# Patient Record
Sex: Male | Born: 1960
Health system: Southern US, Community
[De-identification: ages and names within clinical notes are randomized; demographics above are authoritative.]

## PROBLEM LIST (undated history)

## (undated) DIAGNOSIS — F319 Bipolar disorder, unspecified: Secondary | ICD-10-CM

## (undated) DIAGNOSIS — A419 Sepsis, unspecified organism: Secondary | ICD-10-CM

## (undated) DIAGNOSIS — I10 Essential (primary) hypertension: Secondary | ICD-10-CM

## (undated) DIAGNOSIS — F200 Paranoid schizophrenia: Secondary | ICD-10-CM

## (undated) DIAGNOSIS — R413 Other amnesia: Secondary | ICD-10-CM

## (undated) HISTORY — DX: Other amnesia: R41.3

## (undated) MED FILL — Iron Sucrose Inj 20 MG/ML (Fe Equiv): INTRAVENOUS | Qty: 10 | Status: AC

---

## 2006-04-11 ENCOUNTER — Emergency Department: Payer: Self-pay | Admitting: Emergency Medicine

## 2006-04-26 ENCOUNTER — Emergency Department: Payer: Self-pay

## 2006-06-13 ENCOUNTER — Emergency Department: Payer: Self-pay | Admitting: Emergency Medicine

## 2006-06-14 ENCOUNTER — Other Ambulatory Visit: Payer: Self-pay

## 2006-06-14 ENCOUNTER — Inpatient Hospital Stay: Payer: Self-pay | Admitting: Unknown Physician Specialty

## 2006-11-24 ENCOUNTER — Inpatient Hospital Stay: Payer: Self-pay | Admitting: Psychiatry

## 2006-12-20 ENCOUNTER — Emergency Department: Payer: Self-pay | Admitting: Unknown Physician Specialty

## 2007-02-02 ENCOUNTER — Inpatient Hospital Stay: Payer: Self-pay | Admitting: Psychiatry

## 2007-02-11 ENCOUNTER — Emergency Department: Payer: Self-pay | Admitting: Emergency Medicine

## 2007-07-28 ENCOUNTER — Emergency Department: Payer: Self-pay | Admitting: Emergency Medicine

## 2007-07-29 ENCOUNTER — Emergency Department: Payer: Self-pay | Admitting: Emergency Medicine

## 2007-12-05 ENCOUNTER — Emergency Department: Payer: Self-pay | Admitting: Emergency Medicine

## 2008-03-02 ENCOUNTER — Emergency Department: Payer: Self-pay | Admitting: Emergency Medicine

## 2008-07-02 ENCOUNTER — Emergency Department: Payer: Self-pay | Admitting: Emergency Medicine

## 2008-08-22 ENCOUNTER — Inpatient Hospital Stay: Payer: Self-pay | Admitting: Psychiatry

## 2008-09-21 ENCOUNTER — Inpatient Hospital Stay: Payer: Self-pay | Admitting: Psychiatry

## 2009-06-14 ENCOUNTER — Emergency Department: Payer: Self-pay | Admitting: Emergency Medicine

## 2009-06-20 ENCOUNTER — Inpatient Hospital Stay: Payer: Self-pay | Admitting: Psychiatry

## 2009-07-05 ENCOUNTER — Inpatient Hospital Stay: Payer: Self-pay | Admitting: Psychiatry

## 2009-10-14 ENCOUNTER — Emergency Department: Payer: Self-pay | Admitting: Emergency Medicine

## 2010-02-05 ENCOUNTER — Emergency Department: Payer: Self-pay | Admitting: Emergency Medicine

## 2010-07-31 ENCOUNTER — Inpatient Hospital Stay: Payer: Self-pay | Admitting: Psychiatry

## 2011-06-03 ENCOUNTER — Inpatient Hospital Stay: Payer: Self-pay | Admitting: Psychiatry

## 2012-02-14 ENCOUNTER — Emergency Department: Payer: Self-pay | Admitting: Emergency Medicine

## 2012-02-14 LAB — CBC
HCT: 48.7 % (ref 40.0–52.0)
HGB: 17.2 g/dL (ref 13.0–18.0)
MCH: 31.9 pg (ref 26.0–34.0)
MCHC: 35.4 g/dL (ref 32.0–36.0)
RDW: 13.5 % (ref 11.5–14.5)
WBC: 9.5 10*3/uL (ref 3.8–10.6)

## 2012-02-14 LAB — URINALYSIS, COMPLETE
Bacteria: NONE SEEN
Bilirubin,UR: NEGATIVE
Hyaline Cast: 1
Leukocyte Esterase: NEGATIVE
Nitrite: NEGATIVE
Protein: NEGATIVE
WBC UR: 1 /HPF (ref 0–5)

## 2012-02-14 LAB — DRUG SCREEN, URINE
Benzodiazepine, Ur Scrn: NEGATIVE (ref ?–200)
Cannabinoid 50 Ng, Ur ~~LOC~~: POSITIVE (ref ?–50)
Cocaine Metabolite,Ur ~~LOC~~: NEGATIVE (ref ?–300)
MDMA (Ecstasy)Ur Screen: NEGATIVE (ref ?–500)
Methadone, Ur Screen: NEGATIVE (ref ?–300)
Phencyclidine (PCP) Ur S: NEGATIVE (ref ?–25)
Tricyclic, Ur Screen: NEGATIVE (ref ?–1000)

## 2012-02-14 LAB — COMPREHENSIVE METABOLIC PANEL
Albumin: 4.2 g/dL (ref 3.4–5.0)
Alkaline Phosphatase: 72 U/L (ref 50–136)
Calcium, Total: 9.2 mg/dL (ref 8.5–10.1)
Chloride: 109 mmol/L — ABNORMAL HIGH (ref 98–107)
Co2: 26 mmol/L (ref 21–32)
Creatinine: 1.08 mg/dL (ref 0.60–1.30)
EGFR (African American): >60
EGFR (Non-African Amer.): >60
SGPT (ALT): 22 U/L (ref 12–78)

## 2012-02-14 LAB — ETHANOL
Ethanol %: <0.003 % (ref 0.000–0.080)
Ethanol: <3 mg/dL

## 2012-02-15 LAB — URINE CULTURE

## 2012-02-20 ENCOUNTER — Inpatient Hospital Stay: Payer: Self-pay | Admitting: Psychiatry

## 2012-02-20 LAB — COMPREHENSIVE METABOLIC PANEL
Albumin: 3.6 g/dL (ref 3.4–5.0)
Alkaline Phosphatase: 81 U/L (ref 50–136)
BUN: 11 mg/dL (ref 7–18)
Bilirubin,Total: 0.3 mg/dL (ref 0.2–1.0)
Chloride: 108 mmol/L — ABNORMAL HIGH (ref 98–107)
EGFR (African American): >60
Glucose: 91 mg/dL (ref 65–99)
Potassium: 3.8 mmol/L (ref 3.5–5.1)
SGPT (ALT): 21 U/L (ref 12–78)
Sodium: 141 mmol/L (ref 136–145)
Total Protein: 6.8 g/dL (ref 6.4–8.2)

## 2012-02-20 LAB — URINALYSIS, COMPLETE
Bacteria: NONE SEEN
Bilirubin,UR: NEGATIVE
Blood: NEGATIVE
Glucose,UR: NEGATIVE mg/dL (ref 0–75)
Nitrite: NEGATIVE
Protein: NEGATIVE
RBC,UR: 1 /HPF (ref 0–5)
Specific Gravity: 1.012 (ref 1.003–1.030)
Squamous Epithelial: NONE SEEN
WBC UR: 1 /HPF (ref 0–5)

## 2012-02-20 LAB — DRUG SCREEN, URINE
Benzodiazepine, Ur Scrn: NEGATIVE (ref ?–200)
Cannabinoid 50 Ng, Ur ~~LOC~~: NEGATIVE (ref ?–50)
Cocaine Metabolite,Ur ~~LOC~~: NEGATIVE (ref ?–300)
MDMA (Ecstasy)Ur Screen: NEGATIVE (ref ?–500)
Opiate, Ur Screen: NEGATIVE (ref ?–300)

## 2012-02-20 LAB — ETHANOL
Ethanol %: <0.003 % (ref 0.000–0.080)
Ethanol: <3 mg/dL

## 2012-02-20 LAB — TSH: Thyroid Stimulating Horm: 1.11 u[IU]/mL

## 2012-02-23 LAB — LIPID PANEL
Cholesterol: 145 mg/dL (ref 0–200)
Ldl Cholesterol, Calc: 74 mg/dL (ref 0–100)
VLDL Cholesterol, Calc: 35 mg/dL (ref 5–40)

## 2012-02-23 LAB — FOLATE: Folic Acid: 16.7 ng/mL (ref 3.1–100.0)

## 2013-03-13 ENCOUNTER — Ambulatory Visit: Payer: Self-pay | Admitting: General Surgery

## 2013-04-06 ENCOUNTER — Encounter: Payer: Self-pay | Admitting: *Deleted

## 2013-05-09 ENCOUNTER — Emergency Department: Payer: Self-pay | Admitting: Emergency Medicine

## 2013-05-09 LAB — DRUG SCREEN, URINE
Benzodiazepine, Ur Scrn: NEGATIVE (ref ?–200)
Cannabinoid 50 Ng, Ur ~~LOC~~: NEGATIVE (ref ?–50)
Cocaine Metabolite,Ur ~~LOC~~: NEGATIVE (ref ?–300)
MDMA (Ecstasy)Ur Screen: NEGATIVE (ref ?–500)
Methadone, Ur Screen: NEGATIVE (ref ?–300)
Tricyclic, Ur Screen: NEGATIVE (ref ?–1000)

## 2013-05-09 LAB — CBC
HCT: 46.7 % (ref 40.0–52.0)
HGB: 15.9 g/dL (ref 13.0–18.0)
MCH: 30.3 pg (ref 26.0–34.0)
MCHC: 34 g/dL (ref 32.0–36.0)
MCV: 89 fL (ref 80–100)
Platelet: 223 10*3/uL (ref 150–440)
RBC: 5.23 10*6/uL (ref 4.40–5.90)
WBC: 10.3 10*3/uL (ref 3.8–10.6)

## 2013-05-09 LAB — URINALYSIS, COMPLETE
Bilirubin,UR: NEGATIVE
Blood: NEGATIVE
Protein: NEGATIVE
RBC,UR: 6 /HPF (ref 0–5)
Specific Gravity: 1.018 (ref 1.003–1.030)

## 2013-05-09 LAB — COMPREHENSIVE METABOLIC PANEL
BUN: 10 mg/dL (ref 7–18)
Bilirubin,Total: 0.3 mg/dL (ref 0.2–1.0)
Chloride: 109 mmol/L — ABNORMAL HIGH (ref 98–107)
Creatinine: 0.94 mg/dL (ref 0.60–1.30)
EGFR (African American): >60
Potassium: 3.4 mmol/L — ABNORMAL LOW (ref 3.5–5.1)
SGPT (ALT): 24 U/L (ref 12–78)
Sodium: 143 mmol/L (ref 136–145)
Total Protein: 7.1 g/dL (ref 6.4–8.2)

## 2013-05-09 LAB — ACETAMINOPHEN LEVEL: Acetaminophen: <2 ug/mL

## 2013-05-09 LAB — ETHANOL
Ethanol %: <0.003 % (ref 0.000–0.080)
Ethanol: <3 mg/dL

## 2013-05-09 LAB — SALICYLATE LEVEL: Salicylates, Serum: 4.8 mg/dL — ABNORMAL HIGH

## 2013-05-13 LAB — BASIC METABOLIC PANEL
Anion Gap: 3 — ABNORMAL LOW (ref 7–16)
BUN: 7 mg/dL (ref 7–18)
Calcium, Total: 9 mg/dL (ref 8.5–10.1)
Chloride: 104 mmol/L (ref 98–107)
Co2: 30 mmol/L (ref 21–32)
Creatinine: 0.88 mg/dL (ref 0.60–1.30)
EGFR (Non-African Amer.): >60

## 2013-05-13 LAB — DRUG SCREEN, URINE
Barbiturates, Ur Screen: NEGATIVE (ref ?–200)
Cannabinoid 50 Ng, Ur ~~LOC~~: NEGATIVE (ref ?–50)
Cocaine Metabolite,Ur ~~LOC~~: NEGATIVE (ref ?–300)
MDMA (Ecstasy)Ur Screen: NEGATIVE (ref ?–500)
Opiate, Ur Screen: NEGATIVE (ref ?–300)
Phencyclidine (PCP) Ur S: NEGATIVE (ref ?–25)
Tricyclic, Ur Screen: NEGATIVE (ref ?–1000)

## 2013-05-13 LAB — URINALYSIS, COMPLETE
Bilirubin,UR: NEGATIVE
Glucose,UR: NEGATIVE mg/dL (ref 0–75)
Ketone: NEGATIVE
Leukocyte Esterase: NEGATIVE
Nitrite: NEGATIVE
Ph: 6 (ref 4.5–8.0)
Protein: NEGATIVE
RBC,UR: 4 /HPF (ref 0–5)
Specific Gravity: 1.017 (ref 1.003–1.030)
Squamous Epithelial: NONE SEEN
WBC UR: 1 /HPF (ref 0–5)

## 2013-05-13 LAB — CBC
HCT: 45.9 % (ref 40.0–52.0)
HGB: 15.7 g/dL (ref 13.0–18.0)
MCH: 30.5 pg (ref 26.0–34.0)
MCV: 89 fL (ref 80–100)
Platelet: 203 10*3/uL (ref 150–440)
RBC: 5.15 10*6/uL (ref 4.40–5.90)
RDW: 13.4 % (ref 11.5–14.5)

## 2013-05-13 LAB — ETHANOL: Ethanol %: <0.003 % (ref 0.000–0.080)

## 2013-05-17 ENCOUNTER — Inpatient Hospital Stay: Payer: Self-pay | Admitting: Psychiatry

## 2013-05-18 LAB — VALPROIC ACID LEVEL: Valproic Acid: 55 ug/mL

## 2013-06-02 ENCOUNTER — Encounter (HOSPITAL_COMMUNITY): Payer: Self-pay | Admitting: Emergency Medicine

## 2013-06-02 ENCOUNTER — Emergency Department (HOSPITAL_COMMUNITY)
Admission: EM | Admit: 2013-06-02 | Discharge: 2013-06-05 | Disposition: A | Discharge status: Home / Self Care | Payer: Medicare Other | Attending: Emergency Medicine | Admitting: Emergency Medicine

## 2013-06-02 DIAGNOSIS — F200 Paranoid schizophrenia: Secondary | ICD-10-CM

## 2013-06-02 DIAGNOSIS — I10 Essential (primary) hypertension: Secondary | ICD-10-CM | POA: Insufficient documentation

## 2013-06-02 DIAGNOSIS — F319 Bipolar disorder, unspecified: Secondary | ICD-10-CM | POA: Insufficient documentation

## 2013-06-02 DIAGNOSIS — Z79899 Other long term (current) drug therapy: Secondary | ICD-10-CM | POA: Insufficient documentation

## 2013-06-02 HISTORY — DX: Bipolar disorder, unspecified: F31.9

## 2013-06-02 HISTORY — DX: Paranoid schizophrenia: F20.0

## 2013-06-02 HISTORY — DX: Essential (primary) hypertension: I10

## 2013-06-02 LAB — CBC
HCT: 47.6 % (ref 39.0–52.0)
Hemoglobin: 16.3 g/dL (ref 13.0–17.0)
MCH: 30.1 pg (ref 26.0–34.0)
MCV: 88 fL (ref 78.0–100.0)
RDW: 12.6 % (ref 11.5–15.5)
WBC: 10.2 10*3/uL (ref 4.0–10.5)

## 2013-06-02 LAB — COMPREHENSIVE METABOLIC PANEL
AST: 23 U/L (ref 0–37)
Albumin: 4.2 g/dL (ref 3.5–5.2)
Alkaline Phosphatase: 62 U/L (ref 39–117)
BUN: 7 mg/dL (ref 6–23)
Chloride: 100 mEq/L (ref 96–112)
Creatinine, Ser: 0.92 mg/dL (ref 0.50–1.35)
GFR calc Af Amer: >90 mL/min (ref >90)
GFR calc non Af Amer: >90 mL/min (ref >90)
Glucose, Bld: 118 mg/dL — ABNORMAL HIGH (ref 70–99)
Potassium: 3.5 mEq/L (ref 3.5–5.1)
Total Bilirubin: 0.3 mg/dL (ref 0.3–1.2)

## 2013-06-02 LAB — ETHANOL: Alcohol, Ethyl (B): <11 mg/dL (ref 0–11)

## 2013-06-02 LAB — ACETAMINOPHEN LEVEL: Acetaminophen (Tylenol), Serum: <15 ug/mL (ref 10–30)

## 2013-06-02 LAB — SALICYLATE LEVEL: Salicylate Lvl: <2 mg/dL — ABNORMAL LOW (ref 2.8–20.0)

## 2013-06-02 LAB — RAPID URINE DRUG SCREEN, HOSP PERFORMED
Barbiturates: NOT DETECTED
Benzodiazepines: NOT DETECTED

## 2013-06-02 NOTE — ED Notes (Signed)
Per ems : The patient had c/o Ha and the patient has HTN - the group home reports that he not compliant with medications. The group home would like him evaluated. The patient is confused about what he would like.

## 2013-06-03 DIAGNOSIS — F29 Unspecified psychosis not due to a substance or known physiological condition: Secondary | ICD-10-CM

## 2013-06-03 DIAGNOSIS — F3160 Bipolar disorder, current episode mixed, unspecified: Secondary | ICD-10-CM

## 2013-06-03 MED ORDER — METOPROLOL TARTRATE 25 MG PO TABS
25.0000 mg | ORAL_TABLET | Freq: Two times a day (BID) | ORAL | Status: DC
  Administered 2013-06-04 – 2013-06-05 (×3): 25 mg via ORAL
  Filled 2013-06-03 (×4): qty 1

## 2013-06-03 MED ORDER — RISPERIDONE 2 MG PO TABS
2.0000 mg | ORAL_TABLET | Freq: Two times a day (BID) | ORAL | Status: DC
  Administered 2013-06-04 – 2013-06-05 (×3): 2 mg via ORAL
  Filled 2013-06-03 (×4): qty 1

## 2013-06-03 MED ORDER — CLONAZEPAM 0.5 MG PO TABS
0.5000 mg | ORAL_TABLET | Freq: Two times a day (BID) | ORAL | Status: DC
  Administered 2013-06-04 – 2013-06-05 (×3): 0.5 mg via ORAL
  Filled 2013-06-03 (×4): qty 1

## 2013-06-03 MED ORDER — DOCUSATE SODIUM 100 MG PO CAPS
100.0000 mg | ORAL_CAPSULE | Freq: Two times a day (BID) | ORAL | Status: DC
  Administered 2013-06-04 – 2013-06-05 (×3): 100 mg via ORAL
  Filled 2013-06-03 (×6): qty 1

## 2013-06-03 MED ORDER — ALUM & MAG HYDROXIDE-SIMETH 200-200-20 MG/5ML PO SUSP: 30.0000 mL | ORAL | Status: DC | PRN

## 2013-06-03 MED ORDER — VALPROIC ACID 250 MG/5ML PO SYRP
500.0000 mg | ORAL_SOLUTION | Freq: Three times a day (TID) | ORAL | Status: DC
  Administered 2013-06-04 – 2013-06-05 (×5): 500 mg via ORAL
  Filled 2013-06-03 (×13): qty 10

## 2013-06-03 MED ORDER — TRAZODONE HCL 100 MG PO TABS
100.0000 mg | ORAL_TABLET | Freq: Every day | ORAL | Status: DC
  Administered 2013-06-04: 100 mg via ORAL
  Filled 2013-06-03: qty 1

## 2013-06-03 MED ORDER — LORAZEPAM 1 MG PO TABS: 1.0000 mg | ORAL_TABLET | Freq: Three times a day (TID) | ORAL | Status: DC | PRN

## 2013-06-03 MED ORDER — ACETAMINOPHEN 325 MG PO TABS: 650.0000 mg | ORAL_TABLET | ORAL | Status: DC | PRN

## 2013-06-03 MED ORDER — ONDANSETRON HCL 4 MG PO TABS: 4.0000 mg | ORAL_TABLET | Freq: Three times a day (TID) | ORAL | Status: DC | PRN

## 2013-06-03 NOTE — BH Assessment (Signed)
Assessment Note   Patient is a 52 year old white male that was brought to the ER from the group home because he did not take his psychiatric medication.  Patient reports that he no longer wants to live in the Group Home  The Group Home reports that patient was previously diagnosed with Paranoid Schizophrenia and Bipolar Disorder.  The Group Home reports that the patient was released from [facility] on 05-22-2013 due to manic behaviors due to not taking his medication. During the assessment the patient denied knowing the name of the group home and any previous psychiatric hospitalizations.   The Group Home reports that when he does not take his medication he becomes argumentative, paranoid and delusional. During the assessment the patient reports that he did not have any family and his parents are dead. The Group Home reports that his mother is alive. Additionally, when the patient does not take his medication he responds to external stimuli.   The Group Home confirmed that the patient is a sex offender. Patient reports that he is not guilty of offending the 51 year old girl. Patient reports that he went to prison for 3 years 1993 - 1996. The Group Home reports that he does not have a history of SI/HI or physical aggression to staff members.  Patient denies SI/HI.  Patient denies psychosis.      Axis I: Paranoid Schizophrenia and Bipolar Disorder  Axis II: Deferred Axis III:  Past Medical History  Diagnosis Date  . Bipolar 1 disorder   . Paranoid schizophrenia   . Hypertension    Axis IV: economic problems, housing problems, other psychosocial or environmental problems, problems related to social environment, problems with access to health care services and problems with primary support group Axis V: 31-40 impairment in reality testing  Past Medical History:  Past Medical History  Diagnosis Date  . Bipolar 1 disorder   . Paranoid schizophrenia   . Hypertension     No  past surgical history on file.  Family History: No family history on file.  Social History:  has no tobacco, alcohol, and drug history on file.  Additional Social History:     CIWA: CIWA-Ar BP: 135/96 mmHg Pulse Rate: 91 COWS:    Allergies: No Known Allergies  Home Medications:  (Not in a hospital admission)  OB/GYN Status:  No LMP for male patient.  General Assessment Data Location of Assessment: [facility] ED Is this a Tele or Face-to-Face Assessment?: Face-to-Face Is this an Initial Assessment or a Re-assessment for this encounter?: Initial Assessment Living Arrangements: Other (Comment) (Group Home ) Can pt return to current living arrangement?: Yes Admission Status: Voluntary Is patient capable of signing voluntary admission?: Yes Transfer from: Group Home Referral Source: Self/Family/Friend  Medical Screening Exam [facility] Walk-in ONLY) Medical Exam completed: Yes  [facility] Crisis Care Plan Living Arrangements: Other (Comment) (Group Home ) Name of Psychiatrist: [facility]  Name of Therapist: [facility]  Education Status Is patient currently in school?: No Current Grade: NA Highest grade of school patient has completed: NA Name of school: NA Contact person: NA  Risk to self Suicidal Ideation: No Suicidal Intent: No Is patient at risk for suicide?: No Suicidal Plan?: No Access to Means: No What has been your use of drugs/alcohol within the last 12 months?: NA Previous Attempts/Gestures: No How many times?: 0 Other Self Harm Risks: NA Triggers for Past Attempts: Unknown Intentional Self Injurious Behavior: None Family Suicide History: No Recent stressful life event(s): Other (Comment) (  Prison in the past) Persecutory voices/beliefs?: No Depression: Yes Depression Symptoms: Insomnia;Tearfulness;Isolating;Guilt;Loss of interest in usual pleasures;Feeling worthless/self pity Substance abuse history and/or treatment for substance abuse?: No Suicide prevention information given to  non-admitted patients: Not applicable  Risk to Others Homicidal Ideation: No Thoughts of Harm to Others: No Current Homicidal Intent: No Current Homicidal Plan: No Access to Homicidal Means: No Identified Victim: NA History of harm to others?: No Assessment of Violence: None Noted Violent Behavior Description: Calm Does patient have access to weapons?: No Criminal Charges Pending?: No Does patient have a court date: No  Psychosis Hallucinations: None noted Delusions: None noted  Mental Status Report Appear/Hygiene: Disheveled Eye Contact: Fair Motor Activity: Freedom of movement Speech: Logical/coherent Level of Consciousness: Quiet/awake Mood: Depressed;Suspicious Affect: Depressed;Frightened Anxiety Level: Minimal Thought Processes: Coherent;Relevant Judgement: Unimpaired Orientation: Place;Person;Time;Situation Obsessive Compulsive Thoughts/Behaviors: None  Cognitive Functioning Concentration: Decreased Memory: Recent Impaired;Remote Intact IQ: Average Insight: Fair Impulse Control: Poor Appetite: Fair Weight Loss: 0 Weight Gain: 0 Sleep: No Change Total Hours of Sleep: 8 Vegetative Symptoms: None  ADLScreening [facility] Assessment Services) Patient's cognitive ability adequate to safely complete daily activities?: Yes Patient able to express need for assistance with ADLs?: Yes Independently performs ADLs?: Yes (appropriate for developmental age)  Prior Inpatient Therapy Prior Inpatient Therapy: No Prior Therapy Dates: NA Prior Therapy Facilty/Provider(s): NA Reason for Treatment: NA  Prior Outpatient Therapy Prior Outpatient Therapy: No Prior Therapy Dates: NA Prior Therapy Facilty/Provider(s): NA Reason for Treatment: NA  ADL Screening (condition at time of admission) Patient's cognitive ability adequate to safely complete daily activities?: Yes Patient able to express need for assistance with ADLs?: Yes Independently performs ADLs?: Yes (appropriate  for developmental age)         Values / Beliefs Cultural Requests During Hospitalization: None Spiritual Requests During Hospitalization: None        Additional Information 1:1 In Past 12 Months?: No CIRT Risk: No Elopement Risk: No Does patient have medical clearance?: Yes     Disposition: Pending psychic evaluation with Group Home staff at 10am to determine the patients baseline.  Disposition Initial Assessment Completed for this Encounter: Yes Disposition of Patient: Other dispositions Other disposition(s): Other (Comment) (Pending psych disposition.)  On Site Evaluation by:   Reviewed with Physician:    Phillip Heal LaVerne 06/03/2013 3:07 AM

## 2013-06-03 NOTE — Progress Notes (Signed)
CSW contacted [facility] to put forth care coordinator request.  CSW completed request with Loran Senters.  York Spaniel [place], [phone]     ED CSW  2:01pm

## 2013-06-03 NOTE — ED Provider Notes (Signed)
CSN: [id number]     Arrival date & time 06/02/13  2047 History   None    Chief Complaint  Patient presents with  . Medical Clearance   (Consider location/radiation/quality/duration/timing/severity/associated sxs/prior Treatment) HPI History provided by pt.   Pt comes from a group home, where he has been living for 13 months. He says that he does not trust society and thinks that someone is going to hurt him and he would like to move to [facility]".  He does not like the group home because he and his roommate are the only two in the house that have to share a room, and they are all of a sudden trying to force him to take his meds that he hasn't taken in 20 years.  These meds were prescribed by [facility] and he doesn't trust them.  Denies substance abuse.  Denies SI/HI.  Denies hallucinations.  Past Medical History  Diagnosis Date  . Bipolar 1 disorder   . Paranoid schizophrenia   . Hypertension    No past surgical history on file. No family history on file. History  Substance Use Topics  . Smoking status: Not on file  . Smokeless tobacco: Not on file  . Alcohol Use: Not on file    Review of Systems  All other systems reviewed and are negative.    Allergies  Review of patient's allergies indicates no known allergies.  Home Medications   Current Outpatient Rx  Name  Route  Sig  Dispense  Refill  . clonazePAM (KLONOPIN) 0.5 MG tablet   Oral   Take 0.5 mg by mouth 2 (two) times daily.         . divalproex (DEPAKOTE) 500 MG DR tablet   Oral   Take 500 mg by mouth 3 (three) times daily.         Marland Kitchen docusate sodium (COLACE) 100 MG capsule   Oral   Take 100 mg by mouth 2 (two) times daily.         . metoprolol tartrate (LOPRESSOR) 25 MG tablet   Oral   Take 25 mg by mouth 2 (two) times daily.         Marland Kitchen OLANZapine (ZYPREXA) 15 MG tablet   Oral   Take 15 mg by mouth 2 (two) times daily.         . traZODone (DESYREL) 100 MG tablet   Oral   Take 100 mg by  mouth at bedtime.          BP 150/103  Pulse 107  Temp(Src) 98.7 F (37.1 C) (Oral)  Resp 18  SpO2 100% Physical Exam  Nursing note and vitals reviewed. Constitutional: He is oriented to person, place, and time. He appears well-developed and well-nourished. No distress.  HENT:  Head: Normocephalic and atraumatic.  Eyes:  Normal appearance  Neck: Normal range of motion.  Pulmonary/Chest: Effort normal.  Musculoskeletal: Normal range of motion.  Neurological: He is alert and oriented to person, place, and time.  Psychiatric: His behavior is normal.  Mildly agitated    ED Course  Procedures (including critical care time) Labs Review Labs Reviewed  COMPREHENSIVE METABOLIC PANEL - Abnormal; Notable for the following:    Glucose, Bld 118 (*)    All other components within normal limits  SALICYLATE LEVEL - Abnormal; Notable for the following:    Salicylate Lvl <2.0 (*)    All other components within normal limits  ACETAMINOPHEN LEVEL  CBC  ETHANOL  URINE RAPID DRUG SCREEN (  HOSP PERFORMED)   Imaging Review No results found.  EKG Interpretation   None       MDM   1. Paranoid schizophrenia   2. Bipolar disorder    52yo M w/ bipolar disorder and paranoid schizophrenia comes from group home, reporting that he will not go back because they are trying to force him to take his meds, he has to share a room, and he believes that society is not safe and someone is going to hurt him.  Denies substance abuse, HI/SI and hallucinations.  Medically cleared.  Telepsych consult and psych holding orders written.  1:05 AM     Otilio Miu, PA-C 06/03/13 [phone]

## 2013-06-03 NOTE — ED Notes (Addendum)
Dr Allen into see 

## 2013-06-03 NOTE — Consult Note (Signed)
[facility] Face-to-Face Psychiatry Consult   Reason for Consult:  Psychosis, Paranoia Referring Physician:  EDP Kenneth [name] is an 52 y.o. male.  Assessment: AXIS I:  Bipolar, mixed and Psychotic Disorder NOS AXIS II:  Deferred AXIS III:   Past Medical History  Diagnosis Date  . Bipolar 1 disorder   . Paranoid schizophrenia   . Hypertension    AXIS IV:  other psychosocial or environmental problems, problems related to legal system/crime and problems related to social environment AXIS V:  21-30 behavior considerably influenced by delusions or hallucinations OR serious impairment in judgment, communication OR inability to function in almost all areas  Plan:  Recommend psychiatric Inpatient admission when medically cleared.  Subjective:   Kenneth [name] is a 52 y.o. male patient admitted with Bipolar d/o. Psychosis.  HPI:  Patient is a 52 year old white male that was brought to the ER from the group home because he did not take his psychiatric medication. Patient reports that he no longer wants to live in the Group Home  The Group Home reports that patient was previously diagnosed with Paranoid Schizophrenia and Bipolar Disorder. The Group Home reports that the patient was released from [facility] on 05-22-2013 due to manic behaviors due to not taking his medication. During the assessment this am with Dr Kenneth [name], patient repeatedly reported he does not want to go back to the group home.  His reason for not wanting to go back is because he does not have money to pay them.  Patient states she is ashamed of staying and eating at the group home without paying.  Patient states he does not know where he will go but stated " I am a sex offender but I will not go back to the group home"  Collateral information from group home staff: The Group Home staff who came this am  reports that when he does not take his medication he becomes argumentative, paranoid and delusional. The Group home staff  reports that whenever he stops taking his medications, he loses control.  He also stated nobody has asked him for rent or to pay for food lately.  The Group Home staff confirmed that the patient is a sex offender.  The Group Home reports that he does not have a history of SI/HI or physical aggression to staff members. Patient denies SI/HI. Patient denies psychosis. As soon as he saw a staff from the group home this am, he got up from his bed angry and started yelling at him.  Patient called group home staff and lier and wanted him to leave his room.  At this time, patient is determined to be unsafe to go back to group home since he is not compliant with his medications.  We will admit him for  Safety and stabilization.  We will consider Haldol decanoate since patient is not compliant with medications.  We will seek placement at any facility that has available bed.  We will resume his group home medications with the addition of Risperdal.  We will convert to Risperdal Consta once he is ready.  HPI Elements:   Location:  [facility]. Quality:  SEVERE, PARANOID, TRIED TO ATTACK GROUP HOME STAFF THIS AM.  Past Psychiatric History: Past Medical History  Diagnosis Date  . Bipolar 1 disorder   . Paranoid schizophrenia   . Hypertension     has no tobacco, alcohol, and drug history on file. No family history on file. Family History Substance Abuse: No Family Supports: No  Living Arrangements: Other (Comment) (Group Home ) Can pt return to current living arrangement?: Yes   Allergies:  No Known Allergies  ACT Assessment Complete:  Yes:    Educational Status    Risk to Self: Risk to self Suicidal Ideation: No Suicidal Intent: No Is patient at risk for suicide?: No Suicidal Plan?: No Access to Means: No What has been your use of drugs/alcohol within the last 12 months?: NA Previous Attempts/Gestures: No How many times?: 0 Other Self Harm Risks: NA Triggers for Past Attempts: Unknown Intentional Self  Injurious Behavior: None Family Suicide History: No Recent stressful life event(s): Other (Comment) (Prison in the past) Persecutory voices/beliefs?: No Depression: Yes Depression Symptoms: Insomnia;Tearfulness;Isolating;Guilt;Loss of interest in usual pleasures;Feeling worthless/self pity Substance abuse history and/or treatment for substance abuse?: No Suicide prevention information given to non-admitted patients: Not applicable  Risk to Others: Risk to Others Homicidal Ideation: No Thoughts of Harm to Others: No Current Homicidal Intent: No Current Homicidal Plan: No Access to Homicidal Means: No Identified Victim: NA History of harm to others?: No Assessment of Violence: None Noted Violent Behavior Description: Calm Does patient have access to weapons?: No Criminal Charges Pending?: No Does patient have a court date: No  Abuse:    Prior Inpatient Therapy: Prior Inpatient Therapy Prior Inpatient Therapy: No Prior Therapy Dates: NA Prior Therapy Facilty/Provider(s): NA Reason for Treatment: NA  Prior Outpatient Therapy: Prior Outpatient Therapy Prior Outpatient Therapy: No Prior Therapy Dates: NA Prior Therapy Facilty/Provider(s): NA Reason for Treatment: NA  Additional Information: Additional Information 1:1 In Past 12 Months?: No CIRT Risk: No Elopement Risk: No Does patient have medical clearance?: Yes                  Objective: Blood pressure 122/85, pulse 93, temperature 97.8 F (36.6 C), temperature source Oral, resp. rate 18, SpO2 98.00%.There is no height or weight on file to calculate BMI. Results for orders placed during the hospital encounter of 06/02/13 (from the past 72 hour(s))  ACETAMINOPHEN LEVEL     Status: None   Collection Time    06/02/13  9:30 PM      Result Value Range   Acetaminophen (Tylenol), Serum <15.0  10 - 30 ug/mL   Comment:            THERAPEUTIC CONCENTRATIONS VARY     SIGNIFICANTLY. A RANGE OF 10-30     ug/mL MAY BE  AN EFFECTIVE     CONCENTRATION FOR MANY PATIENTS.     HOWEVER, SOME ARE BEST TREATED     AT CONCENTRATIONS OUTSIDE THIS     RANGE.     ACETAMINOPHEN CONCENTRATIONS     >150 ug/mL AT 4 HOURS AFTER     INGESTION AND >50 ug/mL AT 12     HOURS AFTER INGESTION ARE     OFTEN ASSOCIATED WITH TOXIC     REACTIONS.  CBC     Status: None   Collection Time    06/02/13  9:30 PM      Result Value Range   WBC 10.2  4.0 - 10.5 K/uL   RBC 5.41  4.22 - 5.81 MIL/uL   Hemoglobin 16.3  13.0 - 17.0 g/dL   HCT 40.9  81.1 - 91.4 %   MCV 88.0  78.0 - 100.0 fL   MCH 30.1  26.0 - 34.0 pg   MCHC 34.2  30.0 - 36.0 g/dL   RDW 78.2  95.6 - 21.3 %   Platelets 250  150 - 400 K/uL  COMPREHENSIVE METABOLIC PANEL     Status: Abnormal   Collection Time    06/02/13  9:30 PM      Result Value Range   Sodium 136  135 - 145 mEq/L   Potassium 3.5  3.5 - 5.1 mEq/L   Chloride 100  96 - 112 mEq/L   CO2 24  19 - 32 mEq/L   Glucose, Bld 118 (*) 70 - 99 mg/dL   BUN 7  6 - 23 mg/dL   Creatinine, Ser 1.47  0.50 - 1.35 mg/dL   Calcium 9.5  8.4 - 82.9 mg/dL   Total Protein 7.2  6.0 - 8.3 g/dL   Albumin 4.2  3.5 - 5.2 g/dL   AST 23  0 - 37 U/L   ALT 23  0 - 53 U/L   Alkaline Phosphatase 62  39 - 117 U/L   Total Bilirubin 0.3  0.3 - 1.2 mg/dL   GFR calc non Af Amer >90  >90 mL/min   GFR calc Af Amer >90  >90 mL/min   Comment: (NOTE)     The eGFR has been calculated using the CKD EPI equation.     This calculation has not been validated in all clinical situations.     eGFR's persistently <90 mL/min signify possible Chronic Kidney     Disease.  ETHANOL     Status: None   Collection Time    06/02/13  9:30 PM      Result Value Range   Alcohol, Ethyl (B) <11  0 - 11 mg/dL   Comment:            LOWEST DETECTABLE LIMIT FOR     SERUM ALCOHOL IS 11 mg/dL     FOR MEDICAL PURPOSES ONLY  SALICYLATE LEVEL     Status: Abnormal   Collection Time    06/02/13  9:30 PM      Result Value Range   Salicylate Lvl <2.0 (*) 2.8 -  20.0 mg/dL  URINE RAPID DRUG SCREEN (HOSP PERFORMED)     Status: None   Collection Time    06/02/13 11:04 PM      Result Value Range   Opiates NONE DETECTED  NONE DETECTED   Cocaine NONE DETECTED  NONE DETECTED   Benzodiazepines NONE DETECTED  NONE DETECTED   Amphetamines NONE DETECTED  NONE DETECTED   Tetrahydrocannabinol NONE DETECTED  NONE DETECTED   Barbiturates NONE DETECTED  NONE DETECTED   Comment:            DRUG SCREEN FOR MEDICAL PURPOSES     ONLY.  IF CONFIRMATION IS NEEDED     FOR ANY PURPOSE, NOTIFY LAB     WITHIN 5 DAYS.                LOWEST DETECTABLE LIMITS     FOR URINE DRUG SCREEN     Drug Class       Cutoff (ng/mL)     Amphetamine      1000     Barbiturate      200     Benzodiazepine   200     Tricyclics       300     Opiates          300     Cocaine          300     THC  50   Labs are reviewed and are pertinent for Unremarkable lab results..  Current Facility-Administered Medications  Medication Dose Route Frequency Provider Last Rate Last Dose  . acetaminophen (TYLENOL) tablet 650 mg  650 mg Oral Q4H PRN Otilio Miu, PA-C      . alum & mag hydroxide-simeth (MAALOX/MYLANTA) 200-200-20 MG/5ML suspension 30 mL  30 mL Oral PRN Otilio Miu, PA-C      . LORazepam (ATIVAN) tablet 1 mg  1 mg Oral Q8H PRN Arie Sabina Schinlever, PA-C      . ondansetron (ZOFRAN) tablet 4 mg  4 mg Oral Q8H PRN Otilio Miu, PA-C       Current Outpatient Prescriptions  Medication Sig Dispense Refill  . clonazePAM (KLONOPIN) 0.5 MG tablet Take 0.5 mg by mouth 2 (two) times daily.      . divalproex (DEPAKOTE) 500 MG DR tablet Take 500 mg by mouth 3 (three) times daily.      Marland Kitchen docusate sodium (COLACE) 100 MG capsule Take 100 mg by mouth 2 (two) times daily.      . metoprolol tartrate (LOPRESSOR) 25 MG tablet Take 25 mg by mouth 2 (two) times daily.      Marland Kitchen OLANZapine (ZYPREXA) 15 MG tablet Take 15 mg by mouth 2 (two) times daily.      .  traZODone (DESYREL) 100 MG tablet Take 100 mg by mouth at bedtime.        Psychiatric Specialty Exam:     Blood pressure 122/85, pulse 93, temperature 97.8 F (36.6 C), temperature source Oral, resp. rate 18, SpO2 98.00%.There is no height or weight on file to calculate BMI.  General Appearance: Casual and Disheveled  Eye Contact::  Good  Speech:  Clear and Coherent and Pressured  Volume:  Normal  Mood:  Angry, Anxious, Depressed and Irritable  Affect:  Depressed and Flat  Thought Process:  Disorganized  Orientation:  Full (Time, Place, and Person)  Thought Content:  Paranoid Ideation  Suicidal Thoughts:  No  Homicidal Thoughts:  No  Memory:  Immediate;   Fair Recent;   Fair Remote;   Fair  Judgement:  Impaired  Insight:  Shallow  Psychomotor Activity:  Normal  Concentration:  Fair  Recall:  NA  Akathisia:  NA  Handed:  Right  AIMS (if indicated):     Assets:  Desire for Improvement  Sleep:      Treatment Plan Summary:  Consult and face to face interview with Dr Kenneth [name] We have accepted him for admission in our inpatient unit for admission We will start his home medications and start him on Risperdal 2 MG PO BID We plan to give him Risperdal Consta by Monday.  Daily contact with patient to assess and evaluate symptoms and progress in treatment Medication management  Earney Navy    PMHNP-BC  06/03/2013 11:11 AM  ADDENDUM:  Patient is declined admission at our inpatient Psychiatric unit due to no available beds.  We will seek referral to other facilities with available beds.  Dahlia Byes   PMHNP-BC I have personally seen the patient and agreed with the findings and involved in the treatment plan. Consider Risperdal Consta 25mg  IM at admission and then 2 weeks because of history  Of non compliance. Thresa Ross, MD

## 2013-06-03 NOTE — ED Notes (Addendum)
Up tot he bathroom to shower and change scrubs 

## 2013-06-03 NOTE — ED Notes (Signed)
Patient presents irritable and uncooperative; denies pain, SI,HI, or AVH. Patient insisted that I stop talking, that he was not answering any questions and to just get out of his room.

## 2013-06-03 NOTE — ED Notes (Signed)
IVC papers refaxed to [employer]

## 2013-06-03 NOTE — Progress Notes (Signed)
IVC paperwork ([employer] and [employer]) has been faxed to the Magistrate.

## 2013-06-03 NOTE — Progress Notes (Signed)
Writer spoke to [employer] at [employer].  Write was informed that the patient refused his psychiatric medication today.  The patient has been living at the facility for a year and a half.  The Group Home reports that the patient was released from  [facility] on 05-22-2013 due to manic behaviors due to not taking his medication.    During the assessment the patient denied knowing the name of the group home and any previous psychiatric hospitalizations.    The Group Home reports that when he does not take his medication he becomes argumentative, paranoid and delusional.  During the assessment the patient reports that he did not have any family and his parents are dead.  The Group Home reports that his mother is alive. Additionally, when the patient does not take his medication he responds to external stimuli.    The Group Home confirmed that the patient is a sex offender. Patient reports that he is not guilty of offending the 51 year old girl.  Patient reports that he went to prison for 3 years 1993 - 1996.   The Group Home reports that he does not have a history of SI/HI or physical aggression to staff members.

## 2013-06-03 NOTE — ED Notes (Signed)
Josephine NP and psych MD into see 

## 2013-06-03 NOTE — Progress Notes (Signed)
The Group Home representative Dewain Penning) reports that the patient is his own guardian.
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
## 2013-06-03 NOTE — ED Notes (Signed)
Group home ([facility]) here to see and talk w/ CSW/MD

## 2013-06-03 NOTE — Progress Notes (Signed)
The following facilities have been contacted regarding inpatient tx:  [place] [facility]- per Lanora Manis at capacity [facility]- per Annabelle Harman can fax, referral faxed [facility]- per Jonny Ruiz can fax, referral fax [facility]- per Dorathy Daft only male gero beds available, 1 male bed, referral faxed [facility]- per Zollie Scale can fax, referral faxed [facility]- per Olegario Messier can fax, referral faxed [facility]- per Mikle Bosworth can fax, referral faxed [facility]- per Jimmie Molly beds available [facility]- can fax per Tacey Ruiz, referral faxed [facility]- per Alix can fax, referral faxed [facility]- per Zella Ball can fax, referral faxed [facility]- per Dennie Bible can fax, referral faxed [facility]- per [facility] can fax, referral faxed   Tomi Bamberger, MHT

## 2013-06-03 NOTE — ED Provider Notes (Signed)
Medical screening examination/treatment/procedure(s) were performed by non-physician practitioner and as supervising physician I was immediately available for consultation/collaboration.  EKG Interpretation   None         Jaeleigh Monaco, MD 06/03/13 2357 
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
## 2013-06-03 NOTE — Progress Notes (Addendum)
Received phone from [facility] inquiring about pt's legal guardian and requesting IVC.  Was notified from counselor at [facility] ([facility]) that IVC has been done and that pt is his own guardian will call to make [facility] aware.  [phone]- Edith Cox from [employer] called stating pt has been declined d/t being inappropriate   Tomi Bamberger, MHT

## 2013-06-03 NOTE — ED Notes (Signed)
Pt transferred from triage, pt from group home, presents for medical clearance.  Pt reports he came in due to symptoms of a headache.  Denies at present.  Denies SI, HI or AV hallucinations.  Pt calm & cooperative at present.

## 2013-06-03 NOTE — ED Notes (Signed)
NP, CSW, group home in to talk w/ pt

## 2013-06-03 NOTE — Progress Notes (Signed)
Writer coordinated for a Group Home representative Dewain Penning).  to come to the ER tomorrow at 10am to meet with Psychiatrist or Extender, NP in order to assess if the patient is at base line and is able to be discharged back to their facility.    Writer informed the ER MD (Dr. Ranae Palms) and the nurse Debbe Odea).

## 2013-06-03 NOTE — ED Notes (Signed)
Up to the bathroom 

## 2013-06-03 NOTE — ED Notes (Signed)
Pt declined to have VS taken "afraid I will break the machine and get blamed for it...if you take it they'll put med on medicine..."  Pt also declined to take medications "I don't need them..."  Pt reports he is willing to go back to the group home but will not take the medicine.  Julieanne Cotton NP aware and talked w/ patient

## 2013-06-04 ENCOUNTER — Encounter (HOSPITAL_COMMUNITY): Payer: Self-pay | Admitting: Registered Nurse

## 2013-06-04 DIAGNOSIS — D759 Disease of blood and blood-forming organs, unspecified: Secondary | ICD-10-CM

## 2013-06-04 LAB — VALPROIC ACID LEVEL: Valproic Acid Lvl: 34.5 ug/mL — ABNORMAL LOW (ref 50.0–100.0)

## 2013-06-04 MED ORDER — DIPHENHYDRAMINE HCL 50 MG/ML IJ SOLN
25.0000 mg | Freq: Once | INTRAMUSCULAR | Status: AC
  Administered 2013-06-04: 25 mg via INTRAMUSCULAR
  Filled 2013-06-04: qty 1

## 2013-06-04 MED ORDER — LORAZEPAM 2 MG/ML IJ SOLN
2.0000 mg | Freq: Once | INTRAMUSCULAR | Status: AC
  Administered 2013-06-04: 2 mg via INTRAMUSCULAR
  Filled 2013-06-04: qty 1

## 2013-06-04 NOTE — ED Notes (Signed)
Patient presents calm and cooperative; denies thoughts of harm to self or others; NAD, denies pain.

## 2013-06-04 NOTE — ED Notes (Signed)
Lab in to draw

## 2013-06-04 NOTE — ED Notes (Signed)
Patient's paperwork was faxed to [facility] and [facility] for admission review.

## 2013-06-04 NOTE — ED Notes (Signed)
Pt reports that he is feeling better after starting back on his medications.  NAD,pleasent, cooperative.
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
## 2013-06-04 NOTE — BH Assessment (Signed)
[facility] Assessment Progress Note The following facilities have been contacted regarding inpatient tx:  [facility]- no answer @ 1746 [facility]- per Carol @ 1747, pt declined due to unit acuity  [facility]- per Suzie @ 1752, declined due to their unit being capped [facility]- per Jessica @ 1752, no beds [facility]- per Olivia @ 1755, pt is still under review [facility]- per Edith @ 1756, pt declined by Dr. Harris due to not meeting criteria [facility]- per Joann @ 1804, didn't get referral, referral faxed  [facility]- no answer @ 1809 [facility]- no beds per Leah @ 1810  [facility]- per Stephanie @ 1812, no beds [facility]- per Eric @ 1813, will call [facility] back  [facility]- didn't get referrals, referral faxed  [facility]- per Candy @ 1815, no beds        

## 2013-06-04 NOTE — ED Notes (Signed)
Psych MD and shuvon NP into see 

## 2013-06-04 NOTE — ED Notes (Addendum)
Patient has been labile all night; has not slept all night, in and out of the bathroom drinking water from the faucet; up at nurse's station cursing and yelling awakening other patients; irritable, uncooperative and non-compliant. Banking on glass, drawing with spit on window.

## 2013-06-05 DIAGNOSIS — F329 Major depressive disorder, single episode, unspecified: Secondary | ICD-10-CM

## 2013-06-05 DIAGNOSIS — X838XXA Intentional self-harm by other specified means, initial encounter: Secondary | ICD-10-CM

## 2013-06-05 DIAGNOSIS — F3289 Other specified depressive episodes: Secondary | ICD-10-CM

## 2013-06-05 DIAGNOSIS — R45851 Suicidal ideations: Secondary | ICD-10-CM

## 2013-06-05 NOTE — Progress Notes (Signed)
Patient interviewed and examined by me,needs inpatient hospitalization at he is depressed, suicidal and attempted suicide

## 2013-06-05 NOTE — Treatment Plan (Signed)
Pt is declined admission at [facility] as he meets exclusionary criteria: registered sex offender.

## 2013-06-05 NOTE — [facility] Counselor (Signed)
Dr. Lucianne Muss signed paperwork which rescinds pt's IVC. Originals placed in white IVC NB in Psych ED Nurses' Station. Copy placed in pt's chart. Pt is to be d/c later today.  Evette Cristal, [place] Assessment Counselor

## 2013-06-05 NOTE — Progress Notes (Addendum)
Clinical Social Work  CSW received a call from [employer] at [employer]. Ms. Mayford Knife reports that they will follow patient and assist with resources once patient is DC. Ms. Mayford Knife request a phone call at [phone] when patient is DC so that they can continue to follow patient.  Unk Lightning, LCSW  (Coverage for Catha Gosselin)  Addendum: 1914: CSW was informed that patient would be DC today back to group home. CSW called and left a message with Ms. Williams regarding DC plans.

## 2013-06-05 NOTE — ED Provider Notes (Signed)
5:05 PM Seen and evaluated by psych team. recommeded dc home. Please see consultation note. i did not personally evaluate this patient  Lyanne Co, MD 06/05/13 [phone]

## 2013-06-05 NOTE — [facility] Counselor (Addendum)
  Pt's residence is [facility] [address] [place] [phone] - group home - staff member told Clinical research associate to call manager Morris [phone] - manager Trinda Pascal - Writer spoke w/ Langston Masker. Writer explained that psychiatrist saw pt this am and that pt's behavior has improved. Writer told Morris that pt is psychiatrically clear to be d/c. Writer explained that pt would need to set up Risperdal Consta injections through his current MH provider, [facility]. Morris sts that he is familiar w/ the injections. Morris sts he has to find someone to pick pt up and he will call back. Writer called and spoke w/ Cassandra at [employer] : [phone]. Pt can go on Wed at either 10 am or 2 pm for Risperdal Consta injections. Pt doesn't need an appt per Cassandra. Writer called and left voicemail for [employer] re: [facility] injection schedule. Per Thayer Ohm, pt will be picked up at 5 pm.  Evette Cristal, LCSWA Assessment Counselor

## 2013-06-05 NOTE — Progress Notes (Signed)
Patient ID: Kenneth [name], male   DOB: December 20, 1960, 52 y.o.   MRN: [id number] Psychiatric Specialty Exam: Physical Exam  ROS  Blood pressure 114/75, pulse 87, temperature 97.9 F (36.6 C), temperature source Oral, resp. rate 16, SpO2 98.00%.There is no height or weight on file to calculate BMI.  General Appearance: Casual and Fairly Groomed  Patent attorney::  Good  Speech:  Clear and Coherent and Normal Rate  Volume:  Normal  Mood:  Anxious and Irritable  Affect:  Congruent and Depressed  Thought Process:  Coherent  Orientation:  Full (Time, Place, and Person)  Thought Content:  NA  Suicidal Thoughts:  No  Homicidal Thoughts:  No  Memory:  Immediate;   Fair Recent;   Fair Remote;   Fair  Judgement:  Poor  Insight:  Shallow  Psychomotor Activity:  Normal  Concentration:  Good  Recall:  NA  Akathisia:  NA  Handed:  Right  AIMS (if indicated):     Assets:  Desire for Improvement  Sleep:      Seen this am with Dr Lucianne Muss.  Patient is compliant with his medications and he is taking his Risperdal without problem.  Patient was  More friendly and cooperative today.  Patient reports he is sleeping well and eating well.  However, when his group home was mentioned he stated that" If they don't give me enough food I will do something"  This Clinical research associate promised him that we will talk to group home staff when they come.  Patient denies SI/HI/AVH.  We will discharge him to follow up with his outpatient provider for Risperdal injection.  Dahlia Byes  PMHNP-BC

## 2013-06-06 ENCOUNTER — Other Ambulatory Visit: Payer: Self-pay | Admitting: Registered Nurse

## 2013-06-06 MED ORDER — RISPERIDONE 2 MG PO TABS: 2.0000 mg | ORAL_TABLET | Freq: Two times a day (BID) | ORAL | Status: DC

## 2013-06-06 MED ORDER — VALPROIC ACID 250 MG/5ML PO SYRP: 250.0000 mg | ORAL_SOLUTION | Freq: Two times a day (BID) | ORAL | Status: DC

## 2014-10-02 NOTE — Consult Note (Signed)
PATIENT NAME:  Kenneth, GRAYS MR#:  [name] DATE OF BIRTH:  January 28, 1961  DATE OF CONSULTATION:  02/16/2012  REFERRING PHYSICIAN:  Dr. Pollie Friar  CONSULTING PHYSICIAN:  Kenneth Ishaq B. Evangelia Whitaker, MD  REASON FOR CONSULTATION: To evaluate a psychotic patient.   IDENTIFYING DATA: Kenneth [name] is a 54 year old male with history of schizoaffective disorder.   CHIEF COMPLAINT: "I am fine now."   HISTORY OF PRESENT ILLNESS: Kenneth [name] has been a resident of the same group home for several years now. He has been doing very well there in care of Dr. Timmothy Euler. He recently had a conflict with one of the residents who reportedly keeps coming to Kenneth [name] room to use his cologne and deodorant. Also in middle of the night he frequents his room and stares at the patient. Last night Kenneth [name] felt that there was someone in his room with a razor trying to hurt him. He came to the Emergency Room. Upon arrival in the Emergency Room the patient told Dr. Franchot Mimes that he feels that Kenneth [name] burns his brain and requested change of medication. It is unclear whether he has been lately on Kenneth [name] and Kenneth [name]. There are different lists of medications available. In any case Dr. Franchot Mimes agreed to switch him from Kenneth [name] to Kenneth [name]. The patient has been taking it for two days and feels a changed man. All his hallucinations and delusions have resolved and he is ready to return to his group home. He denies symptoms of depression, anxiety, or psychosis. There is no alcohol or illicit substance use problem currently.   PAST PSYCHIATRIC HISTORY: He has had multiple admissions to numerous hospitals and multiple group home placements. He has been quite stable over the past several years.   FAMILY PSYCHIATRIC HISTORY: His grandfather committed suicide. There is also an aunt with mental illness.   PAST MEDICAL HISTORY: Hypertension.   MEDICATIONS ON ADMISSION:  1. Colace 100 mg twice daily. 2. Enalapril 5 mg daily. 3. Equetro 300 mg  twice daily.  4. Kenneth [name] 80 mg twice daily.  5. Oxybutynin ER 5 mg daily.  6. Trazodone 100 mg daily.   ALLERGIES: Dilantin, lithium and Risperdal.   SOCIAL HISTORY: He is from [place]. Dropped out of school in the eighth grade. He has GED. He is divorced and has one son but does not stay in touch. There is a history of drug abuse but lately this has not been an active problem. The patient is a registered sex offender.    REVIEW OF SYSTEMS: CONSTITUTIONAL: No fevers or chills. No weight changes. EYES: No double or blurred vision. ENT: No hearing loss. RESPIRATORY: No shortness of breath or cough. CARDIOVASCULAR: No chest pain or orthopnea. GASTROINTESTINAL: No abdominal pain, nausea, vomiting, or diarrhea. GENITOURINARY: No incontinence or frequency. ENDOCRINE: No heat or cold intolerance. LYMPHATIC: No anemia or easy bruising. INTEGUMENTARY: No acne or rash. MUSCULOSKELETAL: No muscle or joint pain. NEUROLOGIC: No tingling or weakness. PSYCHIATRIC: See history of present illness for details.   PHYSICAL EXAMINATION:  VITAL SIGNS: Blood pressure 144/86, pulse 75, respirations 18, temperature 96.   GENERAL: This is a slender male in no acute distress. The rest of the physical examination is deferred to his primary attending.   LABORATORY, DIAGNOSTIC AND RADIOLOGICAL DATA: Chemistries are within normal limits. Blood alcohol level zero. LFTs within normal limits. Urine tox screen positive for cannabinoids. CBC within normal limits. Urine culture not suggestive of urinary tract infection.   MENTAL STATUS EXAMINATION: The patient is alert  and oriented to person, place, time, and situation. He is pleasant, polite, and cooperative. He recognizes me from previous admission. He is wearing hospital scrubs. He maintains good eye contact. His speech is of normal rhythm, rate, and volume. Mood is fine with full affect. Thought processing is logical and goal oriented. Thought content: He denies suicidal or  homicidal ideation. He denies delusions or paranoia. He denies auditory or visual hallucinations. His cognition is grossly intact. He registers three out of three and recalls three out of three objects after five minutes. He can spell world forwards and backwards. He knows current president. His insight and judgment are fair.   SUICIDE RISK ASSESSMENT: This is a patient with a long history of depression, mood instability and psychosis who has a history of suicide in the family who came to the hospital psychotic. He requested medication changes, he tolerated it well with full resolution of all his symptoms. He is ready to return to his group home.   DIAGNOSES:  AXIS I:  1. Schizoaffective disorder, bipolar type.  2. Cannabis abuse.   AXIS II: Deferred.   AXIS III:  1. Hypertension.  2. Constipation.   AXIS IV: Mental illness, primary support.   AXIS V: GAF 45.   PLAN: The patient does not meet criteria for involuntary inpatient psychiatric commitment. Please discharge as appropriate. He is to continue all his medications as prescribed by his primary psychiatrist except for change in antipsychotic. I discontinued Kenneth [name] and start Kenneth [name] 15 mg twice daily. He will follow up with Dr. Sammuel Cooper on 09/25 at 9:00. His group home owner will pick him up from the Emergency Room.  ____________________________ Wardell Honour Bary Leriche, MD jbp:cms D: 02/16/2012 21:41:51 ET T: 02/17/2012 08:24:34 ET JOB#: [id number]  cc: Matheau Orona B. Bary Leriche, MD, <Dictator> Clovis Fredrickson MD ELECTRONICALLY SIGNED 02/17/2012 23:57

## 2014-10-02 NOTE — H&P (Signed)
PATIENT NAME:  Kenneth [name], Kenneth [name] MR#:  [id number] DATE OF BIRTH:  12-12-1960  DATE OF ADMISSION:  02/20/2012  DATE OF ASSESSMENT: 02/21/2012   INITIAL ASSESSMENT AND PSYCHIATRIC EVALUATION   IDENTIFYING INFORMATION: The patient is a 54 year old white male with a long history of schizoaffective disorder, not employed and is divorced for many years and admits that he has been living at a group home. According to the information obtained from the patient, he had not been taking his medication because the group home did not have it and they did not give it and so he became very upset and angry and he called the ambulance and came here for help. According to information obtained from the chart, the patient has homicidal ideations and now history of schizophrenia and according to the note by the ED, the patient did not pay rent. He was frustrated and he was taking a straight razor out.   PAST PSYCHIATRIC HISTORY: The patient has a long history of mental illness and history of schizoaffective disorder and multiple inpatient hospitalizations to numerous hospitals and had been at multiple group homes and placements. According to information obtained from the chart, he is being followed by Aline August at [facility]. Medications at the time of discharge in February 2012 are as follows:   1. Geodon 18 milligrams one in the morning and two at bedtime.  2. Tegretol 200 milligrams twice daily.  3. Clonazepam 0.5 milligrams twice daily.  4. Risperdal 50 milligrams at bedtime.  5. Vasotec 5 milligrams daily.   The patient reports that he has not been taking his medications and this was changed and he has been on Zyprexa 50 milligrams at bedtime and some blood pressure medicine and he does not know the name.  He has not been given the medication and he was upset while talking about the same.   FAMILY HISTORY OF MENTAL ILLNESS: Grandfather committed suicide and an aunt has a mental illness.   SOCIAL HISTORY: The  patient grew up in [place] [facility], dropped out in eighth grade, but obtained his G.E.D.   MARRIAGES: He is divorced and has a son, but does not stay in touch.   ALCOHOL AND DRUGS: Absolutely denies drinking alcohol. Denies street or prescription drug abuse. Denies smoking nicotine cigarettes and he got upset when these questions were asked. He is a registered sex offender.   PAST MEDICAL HISTORY:  History of hypertension.   ALLERGIES: Allergic to Dilantin and Risperdal according to the chart. He does not know the name of his physician and got upset when these questions were asked.   PHYSICAL EXAMINATION:   VITAL SIGNS: Temperature 96.4, pulse 70 per minute regular, respirations 18 per minute regular, blood pressure 130/80.   HEENT: Head is normocephalic, atraumatic. Eyes: Pupils are equal, round and reactive to light and accommodation. Fundi bilaterally benign. Extraocular movements visualized. Tympanic membranes visualized. No exudates.   CHEST: Normal expansion. Normal breath sounds heard.   HEART: S1, S2 without any murmurs or gallops.   ABDOMEN: Soft. No organomegaly. Bowel sounds heard.   RECTAL:  Deferred.   NEUROLOGIC: Gait is normal. Romberg is negative. Cranial nerves 2-12 grossly intact and normal. Deep tendon reflexes 2+ normal. Plantars are normal response.   MENTAL STATU EXAMINATION: The patient is dressed in street clothes, very disheveled in appearance. His clothes are very dirty with lots of stains and they are dirty.  Alert and oriented to place, person, time and situation with a little prompting and help and  gets upset and irritable when questions are asked. He went on saying, "I need my Zyprexa. I need my Zyprexa". He had fairly good eye contact.  Flat affect. Thought processes are filled with preoccupation about Zyprexa. Denies any ideas to hurt himself or others. Denies feeling paranoid. Denies hearing voices or seeing things. Recall and memory are good with prompting  but he got upset when questions are asked. When he was asked to spell the word world, he got very upset and said he cannot spell things. Insight and judgment are poor.   IMPRESSION: AXIS I: Schizoaffective disorder, depressed type.   AXIS II: Deferred.   AXIS III: Hypertension.  AXIS IV: Chronic mental illness and poor insight into mental illness.  Poor primary support. Poor family support which leads to noncompliance with medications.   AXIS V: Global Assessment of Functioning 25.   PLAN: The patient is admitted to [facility] for close observation, evaluation and help. He will be started back on all of the medications that he has been taking at the group home depending on the information that is obtained. During the stay in the hospital his medications will be adjusted. At the time of discharge he will be stable and he will not be irritable and appropriate followup appointments will be made in the community.    ____________________________ Wallace Cullens. Franchot Mimes, MD skc:ap D: 02/21/2012 16:56:58 ET T: 02/22/2012 07:54:58 ET JOB#: [id number]  cc: Arlyn Leak K. Franchot Mimes, MD, <Dictator> Dewain Penning MD ELECTRONICALLY SIGNED 02/28/2012 18:13

## 2014-10-02 NOTE — Consult Note (Signed)
Brief Consult Note: Diagnosis: Schizoaffective disorder bipolar type.   Patient was seen by consultant.   Consult note dictated.   Recommend further assessment or treatment.   Orders entered.   Discussed with Attending MD.   Comments: Kenneth [name] has a h/o schizoaffective disorder. He felt that Geodon was "burning his brain" and requested medication change. He was started on Zyprexa by Dr. Franchot Mimes. His symptoms have resolved. He is allowed to return to his group home. He is no longer psychotic, suicidal or homicidal.   PLAN: 1. The patient does not meet criteria for IVC. Please discharge as appropriate.   2. We will discontinue Geodon. We started Zyprexa. Rx is provided. He is to continue all other medications as prescribed by his primary psychiatrist Dr. Timmothy Euler and his PCP.  3. He will follow up with [facility] and Dr. Timmothy Euler on 9/25 at 9:00.  4. Kenneth [name], his group home owner, will pick him up from ER.  Electronic Signatures: Orson Slick (MD)  (Signed 03-Sep-13 14:52)  Authored: Brief Consult Note   Last Updated: 03-Sep-13 14:52 by Orson Slick (MD)

## 2014-10-05 NOTE — Consult Note (Signed)
PATIENT NAME:  Kenneth [name], Kenneth [name] MR#:  [id number] DATE OF BIRTH:  12/13/1960  DATE OF CONSULTATION:  05/13/2013  REFERRING PHYSICIAN:  Wells Guiles L. Reita Cliche, MD CONSULTING PHYSICIAN:  Deniyah Dillavou B. Donnalee Cellucci, MD  REASON FOR CONSULTATION: To evaluate a psychotic patient.   IDENTIFYING DATA: Kenneth [name] is a 54 year old male with history of schizoaffective disorder.   CHIEF COMPLAINT: "I don't want my disability check."   HISTORY OF PRESENT ILLNESS: Kenneth [name] is well known to [place]. He has been brought to the Emergency Room by the staff at the group home when he became psychotic, disorganized, and difficult to redirect in the context of poor medication compliance for the past several days, suspicious that the patient stopped taking medications longer than that. Per the patient, he has not been taking medication for the past 25 years. He does not need medicines, health insurance, or his disability check and the government can take it back. The patient  has been a resident of a group home for quite a bit. He is well liked there when well. They feel that the patient needs to be hospitalized in order to restart his medication. He had been stable on a combination of olanzapine, sertraline, and clonazepam. The patient has not been sleeping at night. He is agitated, talking out of his head, difficult to redirect, and refusing medication. He has not been sleeping at night. The staff had to trick him into coming to the hospital. He was told that he is going to [employer] and to get some something to eat at the fast food. The patient himself is unable to provide any information. He does recognize me from previous admission. He is very conspiratorial with me and believes that I know something about his whereabouts. He denies suicidal or homicidal ideation. He denies hearing voices. He denies alcohol or illicit substance use.   PAST PSYCHIATRIC HISTORY: There is a long history of mental illness with multiple psychiatric  hospitalizations. His first admission was in his mid-20's. He has been hospitalized at [facility] almost 10 times and as many at [facility]. He has also been admitted to [facility]. He has been tried on numerous medications. There is a history of medication noncompliance. He does have a history of suicide attempt by cutting his wrist but no other attempts.   FAMILY PSYCHIATRIC HISTORY: His grandfather committed suicide. There is an aunt with mental illness.   PAST MEDICAL HISTORY: Hypertension, urinary incontinence, and constipation.   ALLERGIES: DILANTIN, LITHIUM, RISPERDAL.   MEDICATIONS ON ADMISSION: Colace 100 mg twice daily, Zyprexa Zydis 15 mg twice daily, trazodone 100 mg at bedtime, Klonopin 0.5 mg twice daily, Zoloft 100 mg daily, metoprolol 25 mg twice daily.   SOCIAL HISTORY: He is originally from [place]. He has a sister with whom he stays in touch. He denies any history of sexual or physical abuse. He dropped out of high school in eighth grade but has GED. He worked some in [employer] but has been disabled since his mid-20s. He has lived in multiple group homes in the past. He does have a son but has not been seeing him since the son was 32 years old. He had several DUIs but has not been drinking lately. He is a registered sex offender. He was arrested after he asked females to watch him masturbate. There is no history of sexual assault.   REVIEW OF SYSTEMS:  CONSTITUTION: No fevers or chills. Positive for weight changes.  EYES: No double  or blurred vision.  ENT: No hearing loss.  RESPIRATORY: No shortness of breath or cough.  CARDIOVASCULAR: No chest pain or orthopnea.  GASTROINTESTINAL: No abdominal pain, nausea, vomiting, or diarrhea.  GENITOURINARY: No incontinence or frequency.  ENDOCRINE: No heat or cold intolerance.  LYMPHATIC: No anemia or easy bruising.  INTEGUMENTARY: No acne or rash.  MUSCULOSKELETAL: No muscle or joint pain.   NEUROLOGIC: No tingling or weakness.  PSYCHIATRIC: See history of present illness for details.   PHYSICAL EXAMINATION: VITAL SIGNS: Blood pressure 171/108, pulse 103, respirations 20, temperature 98.5.  GENERAL: This is a well-developed male in no acute distress.   The rest of the physical examination is deferred to his primary attending.   MENTAL STATUS EXAMINATION: The patient is alert and oriented to person and place only. He is unable to tell me why he is in the hospital. He sees no problem with his behavior. He does not want to tell me the name of his group home. He believes that he was tricked into coming to the hospital. He does not care for medications, health insurance, or money. He does not believe that he has been taking any medications for the past 25 years. He is disorganized, unwilling to answer most of my questions. He, however, does remember me from previous admission and calls me Dr. Mamie Nick appropriately. He denies thoughts of hurting himself or others. He denies hallucinations. His cognition is impaired. His insight and judgment are very poor.   SUICIDE RISK ASSESSMENT: This is a patient with a long history of mental illness who became psychotic and disorganized in the context of medication noncompliance. He is at increased risk of violence.   DIAGNOSES: AXIS I: Schizoaffective disorder, bipolar type; remote history of alcohol, cocaine, and cannabis abuse.  AXIS II: Deferred.  AXIS III: Hypertension, incontinence, constipation.  AXIS IV: Mental illness, treatment compliance, primary support.  AXIS V: Global Assessment of Functioning 35.   PLAN: The patient refuses medication. I restarted him on all medicines as prescribed in the community. If he takes medications and is redirectable, we will transfer him to behavioral medicine unit. He is on involuntary commitment. Psychiatry will follow up.    ____________________________ Wardell Honour. Bary Leriche, MD jbp:jcm D: 05/13/2013  15:14:05 ET T: 05/13/2013 16:07:32 ET JOB#: [id number]  cc: Tobey Schmelzle B. Bary Leriche, MD, <Dictator> Clovis Fredrickson MD ELECTRONICALLY SIGNED 05/16/2013 8:09

## 2014-10-05 NOTE — Consult Note (Signed)
Brief Consult Note: Diagnosis: Schizoaffective disorder bipolar type.   Patient was seen by consultant.   Consult note dictated.   Recommend further assessment or treatment.   Orders entered.   Discussed with Attending MD.   Comments: Kenneth [name] has a h/o schizoaffective disorder. He was brought by his group home staff floridly psychotic in the context of medication noncompliance.   PLAN: 1. The patient is on IVC.    2. We restarted all his medications but the patient refuses oral medications.    3. We offered Zyprexa injections when the patient refuses oral Zyprexa. He has been accepting injections gladly. Unfortunately we do not have monthly injectable Zyprexa preparation on our formulary.   4. The patient is not interested in admission to BMU. Will admit to the unit when compliant with meds.   4. Psychiiatry will follow up.  Electronic Signatures: Orson Slick (MD)  (Signed 01-Dec-14 21:02)  Authored: Brief Consult Note   Last Updated: 01-Dec-14 21:02 by Orson Slick (MD)

## 2014-10-05 NOTE — Consult Note (Signed)
Brief Consult Note: Diagnosis: Schizoaffective disorder bipolar type.   Patient was seen by consultant.   Recommend further assessment or treatment.   Orders entered.   Discussed with Attending MD.   Comments: Pt seen in ED [facility]. He has a h/o schizoaffective disorder and was brought by his group home staff floridly psychotic in the context of medication noncompliance. He stated that he took his meds this am and came from group home. he denied AH/VH. Appeared calm.   PLAN: 1. The patient is on IVC.    2. Pt took his oral meds this am.   3. Will discuss about admitting him to [facility] with Dr Bary Leriche this am.  Electronic Signatures: Jeronimo Norma (MD)  (Signed 02-Dec-14 09:55)  Authored: Brief Consult Note   Last Updated: 02-Dec-14 09:55 by Jeronimo Norma (MD)

## 2014-10-05 NOTE — Consult Note (Signed)
Brief Consult Note: Diagnosis: Schizoaffective disorder bipolar type.   Patient was seen by consultant.   Consult note dictated.   Recommend further assessment or treatment.   Orders entered.   Discussed with Attending MD.   Comments: Mr. Biskup has a h/o schizoaffective disorder. He was brought by his group home staff floridly psychotic in the context of medication noncompliance.   PLAN: 1. The patient is on IVC.    2. We restarted all his medications.   3. Will admit to the unit when compliant with meds.   4. Psychiiatry will follow up.  Electronic Signatures: Orson Slick (MD)  (Signed [phone] 12:33)  Authored: Brief Consult Note   Last Updated: 29-Nov-14 12:33 by Orson Slick (MD)

## 2014-10-23 NOTE — H&P (Signed)
PATIENT NAME:  Kenneth [name], Kenneth [name] [id number] OF BIRTH:  12/03/1960 OF ADMISSION: 05/17/2013 PHYSICIAN:  Wells Guiles L. Lord, MDPHYSICIAN:  [place] Torion Hulgan, MD DATA: Kenneth [name] is a 54 year old male with history of schizoaffective disorder.  COMPLAINT: "I want to stay in the ER and watch TV."  OF PRESENT ILLNESS: Kenneth [name] is well known to [place]. He was brought to the Emergency Room by the staff at the group home when he became psychotic, disorganized, and difficult to redirect in the context of poor medication compliance for the past several days. I am suspicious that the patient stopped taking medications longer than that. Per the patient, he has not been taking medication for the past 25 years, he feels that he does not need medicines, health insurance, or his disability check and the government can take it back. The patient initially refused oral medications and was given twice daily injections of Zyprexa. Once he started taking oral medication, he was admitted to psychiatry. He has been a resident of a group home for several years. He is well liked there when well. They felt that the patient needs to be hospitalized in order to restart his medication. He had been stable on a combination of olanzapine, sertraline, and clonazepam. The patient has not been sleeping at night. He is agitated, talking out of his head, difficult to redirect, and refusing medication. He has not been sleeping at night. The staff had to trick him into coming to the hospital. He was told that he is going to [employer] and to get some something to eat at the fast food. The patient himself is unable to provide any information. He does recognizes me from previous admissions. He denies suicidal or homicidal ideation. He denies hearing voices. He denies alcohol or illicit substance use.  PSYCHIATRIC HISTORY: There is a long history of mental illness with multiple psychiatric hospitalizations. His first admission was in his mid-20's. He has  been hospitalized at [facility] almost 10 times and as many times at [facility]. He has also been admitted to [facility]. He has been tried on numerous medications. There is a history of medication noncompliance. He does have a history of suicide attempt by cutting his wrist but no other attempts.  PSYCHIATRIC HISTORY: His grandfather committed suicide. There is an aunt with mental illness.  MEDICAL HISTORY: Hypertension, urinary incontinence, and constipation.   ALLERGIES: DILANTIN, LITHIUM, RISPERDAL.  ON ADMISSION: Colace 100 mg twice daily, Zyprexa Zydis 15 mg twice daily, trazodone 100 mg at bedtime, Klonopin 0.5 mg twice daily, Zoloft 100 mg daily, metoprolol 25 mg twice daily.  HISTORY: He is originally from [place] [place] [facility] [place]. He has a sister with whom he stays in touch. He denies any history of sexual or physical abuse. He dropped out of high school in eighth grade but has GED. He worked some in [employer] but has been disabled since his mid-20s. He has lived in multiple group homes in the past. He does have a son but has not been seeing him since the son was 17 years old. He had several DUIs but has not been drinking lately. He is a registered sex offender. He was arrested after he asked females to watch him masturbate. There is no history of sexual assault.  OF SYSTEMS: No fevers or chills. Positive for weight changes. No double or blurred vision. No hearing loss. No shortness of breath or cough. No chest pain or orthopnea. No abdominal pain, nausea, vomiting, or diarrhea. No incontinence or  frequency. No heat or cold intolerance. No anemia or easy bruising. No acne or rash. No muscle or joint pain. No tingling or weakness. See history of present illness for details.  EXAMINATION:SIGNS: Blood pressure 171/108, pulse 103, respirations 20, temperature 98.5. This is a well-developed male in no acute distress.  HEENT: The pupils are equal, round and reactive to light. Sclerae  anicteric. Supple. No thyromegaly. Clear to auscultation. No dullness to percussion. Regular rhythm and rate. No murmurs, rubs or gallops. Soft, nontender, nondistended. Positive bowel sounds. Normal muscle strength in all extremities. No rashes or bruises. No cervical adenopathy. Cranial nerves II through XII are intact.  STATUS EXAMINATION ON ADMISSION: The patient is alert and oriented to person and place only. He is unable to tell me why he is in the hospital. He sees no problem with his behavior. He does not want to tell me the name of his group home. He believes that he was tricked into coming to the hospital. He does not care for medications, health insurance, or money. He does not believe that he has been taking any medications for the past 25 years. He is disorganized, unwilling to answer most of my questions. He, however, does remember me from previous admission and calls me Dr. Mamie Nick appropriately. He denies thoughts of hurting himself or others. He denies hallucinations. His cognition is impaired. His insight and judgment are very poor.  RISK ASSESSMENT ON ADMISSION: This is a patient with a long history of mental illness who became psychotic and disorganized in the context of medication noncompliance. He is at increased risk of violence.  NITIAL DIAGNOSES:I:  Schizoaffective disorder, bipolar type.  Remote history of alcohol, cocaine, and cannabis abuse.  AXIS II:  Deferred. III:  Hypertension, incontinence, constipation. IV:  Mental illness, treatment compliance, primary support. V:  Global Assessment of Functioning 35.  The patient was admitted to [facility] for safety, stabilization and medication management. She was initially placed on suicide precautions and was closely monitored for any unsafe behaviors. She underwent full psychiatric and risk assessment. She received pharmacotherapy, individual and group psychotherapy, substance abuse counseling.  And support from therapeutic milieu.  Treatment compliance: The patient initially refused medications and agreed to take im injections only. At the time of transfer, he was complaint with medications.  Psychosis: He is continued on Zyprexa zydid 15 mg twice daily. We would like to switch to monthly Relprevv injections but it is not on our formulary. Mood: He was started on Depakote 500 mg tid. Level check tomorrow. HTN: He is on Metoprolol. Disposition: He will be discharged to his old group home. He will likely follow up with Dr. Rosine Door in [place], one of the few physicians who administer Relprevv.   Electronic Signatures: Orson Slick (MD)  (Signed on 03-Dec-14 22:05)  Authored  Last Updated: 03-Dec-14 22:05 by Orson Slick (MD)

## 2015-09-23 DIAGNOSIS — M5090 Cervical disc disorder, unspecified, unspecified cervical region: Secondary | ICD-10-CM | POA: Diagnosis not present

## 2015-09-23 DIAGNOSIS — I10 Essential (primary) hypertension: Secondary | ICD-10-CM | POA: Diagnosis not present

## 2015-12-27 DIAGNOSIS — M5090 Cervical disc disorder, unspecified, unspecified cervical region: Secondary | ICD-10-CM | POA: Diagnosis not present

## 2015-12-27 DIAGNOSIS — M5412 Radiculopathy, cervical region: Secondary | ICD-10-CM | POA: Diagnosis not present

## 2015-12-27 DIAGNOSIS — G542 Cervical root disorders, not elsewhere classified: Secondary | ICD-10-CM | POA: Diagnosis not present

## 2016-09-22 DIAGNOSIS — M5090 Cervical disc disorder, unspecified, unspecified cervical region: Secondary | ICD-10-CM | POA: Diagnosis not present

## 2016-09-22 DIAGNOSIS — I10 Essential (primary) hypertension: Secondary | ICD-10-CM | POA: Diagnosis not present

## 2016-12-09 DIAGNOSIS — M5090 Cervical disc disorder, unspecified, unspecified cervical region: Secondary | ICD-10-CM | POA: Diagnosis not present

## 2016-12-09 DIAGNOSIS — I10 Essential (primary) hypertension: Secondary | ICD-10-CM | POA: Diagnosis not present

## 2016-12-09 DIAGNOSIS — M5412 Radiculopathy, cervical region: Secondary | ICD-10-CM | POA: Diagnosis not present

## 2018-01-07 DIAGNOSIS — M5412 Radiculopathy, cervical region: Secondary | ICD-10-CM | POA: Diagnosis not present

## 2018-01-07 DIAGNOSIS — G542 Cervical root disorders, not elsewhere classified: Secondary | ICD-10-CM | POA: Diagnosis not present

## 2018-01-07 DIAGNOSIS — I10 Essential (primary) hypertension: Secondary | ICD-10-CM | POA: Diagnosis not present

## 2018-03-15 ENCOUNTER — Other Ambulatory Visit: Payer: Self-pay

## 2018-03-15 ENCOUNTER — Emergency Department (EMERGENCY_DEPARTMENT_HOSPITAL)
Admission: EM | Admit: 2018-03-15 | Discharge: 2018-03-16 | Disposition: A | Discharge status: Other Acute Inpatient Hospital | Payer: Medicare Other | Source: Home / Self Care | Attending: Emergency Medicine | Admitting: Emergency Medicine

## 2018-03-15 DIAGNOSIS — Z79899 Other long term (current) drug therapy: Secondary | ICD-10-CM | POA: Insufficient documentation

## 2018-03-15 DIAGNOSIS — R000 Tachycardia, unspecified: Secondary | ICD-10-CM | POA: Diagnosis not present

## 2018-03-15 DIAGNOSIS — I10 Essential (primary) hypertension: Secondary | ICD-10-CM | POA: Diagnosis not present

## 2018-03-15 DIAGNOSIS — R4689 Other symptoms and signs involving appearance and behavior: Secondary | ICD-10-CM

## 2018-03-15 DIAGNOSIS — Z9119 Patient's noncompliance with other medical treatment and regimen: Secondary | ICD-10-CM

## 2018-03-15 DIAGNOSIS — F209 Schizophrenia, unspecified: Secondary | ICD-10-CM | POA: Insufficient documentation

## 2018-03-15 DIAGNOSIS — F17210 Nicotine dependence, cigarettes, uncomplicated: Secondary | ICD-10-CM

## 2018-03-15 DIAGNOSIS — F203 Undifferentiated schizophrenia: Secondary | ICD-10-CM

## 2018-03-15 DIAGNOSIS — Z743 Need for continuous supervision: Secondary | ICD-10-CM | POA: Diagnosis not present

## 2018-03-15 DIAGNOSIS — R4182 Altered mental status, unspecified: Secondary | ICD-10-CM | POA: Diagnosis not present

## 2018-03-15 DIAGNOSIS — Z91199 Patient's noncompliance with other medical treatment and regimen due to unspecified reason: Secondary | ICD-10-CM

## 2018-03-15 LAB — CBC WITH DIFFERENTIAL/PLATELET
Basophils Absolute: 0.1 10*3/uL (ref 0–0.1)
Basophils Relative: 1 %
Eosinophils Absolute: 0.1 10*3/uL (ref 0–0.7)
Eosinophils Relative: 1 %
HEMATOCRIT: 47.3 % (ref 40.0–52.0)
Hemoglobin: 16.5 g/dL (ref 13.0–18.0)
LYMPHS PCT: 18 %
Lymphs Abs: 1.9 10*3/uL (ref 1.0–3.6)
MCH: 31 pg (ref 26.0–34.0)
MCHC: 35 g/dL (ref 32.0–36.0)
MCV: 88.7 fL (ref 80.0–100.0)
MONO ABS: 1.4 10*3/uL — AB (ref 0.2–1.0)
MONOS PCT: 14 %
NEUTROS ABS: 7.2 10*3/uL — AB (ref 1.4–6.5)
NEUTROS PCT: 66 %
Platelets: 179 10*3/uL (ref 150–440)
RBC: 5.33 MIL/uL (ref 4.40–5.90)
RDW: 13.4 % (ref 11.5–14.5)
WBC: 10.7 10*3/uL — ABNORMAL HIGH (ref 3.8–10.6)

## 2018-03-15 LAB — COMPREHENSIVE METABOLIC PANEL
ALBUMIN: 4.5 g/dL (ref 3.5–5.0)
ALT: 80 U/L — ABNORMAL HIGH (ref 0–44)
ANION GAP: 12 (ref 5–15)
AST: 183 U/L — AB (ref 15–41)
Alkaline Phosphatase: 54 U/L (ref 38–126)
BILIRUBIN TOTAL: 0.7 mg/dL (ref 0.3–1.2)
BUN: 27 mg/dL — AB (ref 6–20)
CO2: 24 mmol/L (ref 22–32)
Calcium: 9.1 mg/dL (ref 8.9–10.3)
Chloride: 101 mmol/L (ref 98–111)
Creatinine, Ser: 1.32 mg/dL — ABNORMAL HIGH (ref 0.61–1.24)
GFR calc Af Amer: >60 mL/min (ref >60)
GFR calc non Af Amer: 59 mL/min — ABNORMAL LOW (ref >60)
GLUCOSE: 143 mg/dL — AB (ref 70–99)
POTASSIUM: 3.3 mmol/L — AB (ref 3.5–5.1)
Sodium: 137 mmol/L (ref 135–145)
TOTAL PROTEIN: 7.3 g/dL (ref 6.5–8.1)

## 2018-03-15 LAB — SALICYLATE LEVEL: Salicylate Lvl: <7 mg/dL (ref 2.8–30.0)

## 2018-03-15 LAB — ETHANOL

## 2018-03-15 LAB — TROPONIN I: Troponin I: <0.03 ng/mL (ref <0.03)

## 2018-03-15 LAB — ACETAMINOPHEN LEVEL: Acetaminophen (Tylenol), Serum: <10 ug/mL — ABNORMAL LOW (ref 10–30)

## 2018-03-15 MED ORDER — SODIUM CHLORIDE 0.9 % IV BOLUS
1000.0000 mL | Freq: Once | INTRAVENOUS | Status: AC
  Administered 2018-03-15: 1000 mL via INTRAVENOUS

## 2018-03-15 MED ORDER — OLANZAPINE 5 MG PO TABS
15.0000 mg | ORAL_TABLET | Freq: Two times a day (BID) | ORAL | Status: DC
  Filled 2018-03-15 (×2): qty 1

## 2018-03-15 MED ORDER — DIVALPROEX SODIUM 500 MG PO DR TAB
500.0000 mg | DELAYED_RELEASE_TABLET | Freq: Three times a day (TID) | ORAL | Status: DC
  Administered 2018-03-16: 500 mg via ORAL
  Filled 2018-03-15 (×3): qty 1

## 2018-03-15 MED ORDER — RISPERIDONE 1 MG PO TABS
2.0000 mg | ORAL_TABLET | Freq: Two times a day (BID) | ORAL | Status: DC
  Filled 2018-03-15 (×2): qty 2

## 2018-03-15 MED ORDER — LORAZEPAM 2 MG/ML IJ SOLN
2.0000 mg | Freq: Once | INTRAMUSCULAR | Status: DC
  Filled 2018-03-15: qty 1

## 2018-03-15 MED ORDER — TRAZODONE HCL 100 MG PO TABS
100.0000 mg | ORAL_TABLET | Freq: Every day | ORAL | Status: DC
  Filled 2018-03-15: qty 1

## 2018-03-15 MED ORDER — CLONAZEPAM 0.5 MG PO TABS
0.5000 mg | ORAL_TABLET | Freq: Two times a day (BID) | ORAL | Status: DC
  Filled 2018-03-15 (×2): qty 1

## 2018-03-15 NOTE — ED Triage Notes (Signed)
Pt arrived via EMS after pt called 911 while he was at a truck stop and stated that he didn't know who he was or how he got there.

## 2018-03-15 NOTE — ED Notes (Signed)
PTA EMS states that he was Oriented x 1 only to person

## 2018-03-15 NOTE — ED Provider Notes (Signed)
[facility] Emergency Department Provider Note  ___________________________________________   First MD Initiated Contact with Patient 03/15/18 2150     (approximate)  I have reviewed the triage vital signs and the nursing notes.   HISTORY  Chief Complaint Altered Mental Status   HPI Starsky Nanna. is a 57 y.o. male with history of bipolar disorder as well as hypertension paranoid schizophrenia was presented emergency department after being picked up a truck stop by EMS.  The patient says that he was very confused did not know who he was or where he was.  However, he says that he knows who he is now and denies any pain.  Says that he does not remember doing any drugs or drinking at all this evening.  Does not report any suicidal or homicidal ideation.   Past Medical History:  Diagnosis Date  . Bipolar 1 disorder ([place])   . Hypertension   . Paranoid schizophrenia ([place])     There are no active problems to display for this patient.   History reviewed. No pertinent surgical history.  Prior to Admission medications   Medication Sig Start Date End Date Taking? Authorizing Provider  clonazePAM (KLONOPIN) 0.5 MG tablet Take 0.5 mg by mouth 2 (two) times daily.    [provider]  divalproex (DEPAKOTE) 500 MG DR tablet Take 500 mg by mouth 3 (three) times daily.    [provider]  docusate sodium (COLACE) 100 MG capsule Take 100 mg by mouth 2 (two) times daily.    [provider]  metoprolol tartrate (LOPRESSOR) 25 MG tablet Take 25 mg by mouth 2 (two) times daily.    [provider]  OLANZapine (ZYPREXA) 15 MG tablet Take 15 mg by mouth 2 (two) times daily.    [provider]  risperiDONE (RISPERDAL) 2 MG tablet Take 1 tablet (2 mg total) by mouth 2 (two) times daily. 06/06/13 06/06/14  Rankin, Shuvon B, NP  traZODone (DESYREL) 100 MG tablet Take 100 mg by mouth at bedtime.    [provider]    Valproic Acid (DEPAKENE) 250 MG/5ML SYRP syrup Take 5 mLs (250 mg total) by mouth 2 (two) times daily. 06/06/13   Rankin, Shuvon B, NP    Allergies Patient has no known allergies.  History reviewed. No pertinent family history.  Social History Social History   Tobacco Use  . Smoking status: Current Every Day Smoker    Types: Cigarettes  Substance Use Topics  . Alcohol use: Not on file  . Drug use: Not on file    Review of Systems  Constitutional: No fever/chills Eyes: No visual changes. ENT: No sore throat. Cardiovascular: Denies chest pain. Respiratory: Denies shortness of breath. Gastrointestinal: No abdominal pain.  No nausea, no vomiting.  No diarrhea.  No constipation. Genitourinary: Negative for dysuria. Musculoskeletal: Negative for back pain. Skin: Negative for rash. Neurological: Negative for headaches, focal weakness or numbness.   ____________________________________________   PHYSICAL EXAM:  VITAL SIGNS: ED Triage Vitals  Enc Vitals Group     BP 03/15/18 2200 (!) 144/107     Pulse Rate 03/15/18 2200 (!) 130     Resp 03/15/18 2200 18     Temp 03/15/18 2200 99.3 F (37.4 C)     Temp Source 03/15/18 2200 Oral     SpO2 03/15/18 2157 95 %     Weight 03/15/18 2200 150 lb (68 kg)     Height 03/15/18 2200 5\' 5"  (1.651 m)  Head Circumference --      Peak Flow --      Pain Score 03/15/18 2200 0     Pain Loc --      Pain Edu? --      Excl. in [place]? --     Constitutional: Alert and oriented. Well appearing and in no acute distress.  Patient now ANO x4. Eyes: Conjunctivae are normal.  Head: Atraumatic. Nose: No congestion/rhinnorhea. Mouth/Throat: Mucous membranes are moist.  Neck: No stridor.   Cardiovascular: Tachycardic, regular rhythm. Grossly normal heart sounds.  Good peripheral circulation. Respiratory: Normal respiratory effort.  No retractions. Lungs CTAB. Gastrointestinal: Soft and nontender. No distention. No CVA  tenderness. Musculoskeletal: No lower extremity tenderness nor edema.  No joint effusions. Neurologic:  Normal speech and language. No gross focal neurologic deficits are appreciated. Skin:  Skin is warm, dry and intact. No rash noted. Psychiatric: Mood and affect are normal. Speech and behavior are normal.  ____________________________________________   LABS (all labs ordered are listed, but only abnormal results are displayed)  Labs Reviewed  CBC WITH DIFFERENTIAL/PLATELET - Abnormal; Notable for the following components:      Result Value   WBC 10.7 (*)    Neutro Abs 7.2 (*)    Monocytes Absolute 1.4 (*)    All other components within normal limits  COMPREHENSIVE METABOLIC PANEL - Abnormal; Notable for the following components:   Potassium 3.3 (*)    Glucose, Bld 143 (*)    BUN 27 (*)    Creatinine, Ser 1.32 (*)    AST 183 (*)    ALT 80 (*)    GFR calc non Af Amer 59 (*)    All other components within normal limits  ACETAMINOPHEN LEVEL - Abnormal; Notable for the following components:   Acetaminophen (Tylenol), Serum <10 (*)    All other components within normal limits  TROPONIN I  SALICYLATE LEVEL  ETHANOL  URINALYSIS, COMPLETE (UACMP) WITH MICROSCOPIC  URINE DRUG SCREEN, QUALITATIVE ([facility] ONLY)   ____________________________________________  EKG  ED ECG REPORT I, Doran Stabler, the attending physician, personally viewed and interpreted this ECG.   Date: 03/15/2018  EKG Time: 2152  Rate: 109  Rhythm: sinus tachycardia  Axis: Normal  Intervals:none  ST&T Change: No ST segment elevation or depression.  No abnormal T wave inversion.  ____________________________________________  RADIOLOGY   ____________________________________________   PROCEDURES  Procedure(s) performed:   Procedures  Critical Care performed:   ____________________________________________   INITIAL IMPRESSION / ASSESSMENT AND PLAN / ED COURSE  Pertinent labs & imaging  results that were available during my care of the patient were reviewed by me and considered in my medical decision making (see chart for details).  Differential diagnosis includes, but is not limited to, alcohol, illicit or prescription medications, or other toxic ingestion; intracranial pathology such as stroke or intracerebral hemorrhage; fever or infectious causes including sepsis; hypoxemia and/or hypercarbia; uremia; trauma; endocrine related disorders such as diabetes, hypoglycemia, and thyroid-related diseases; hypertensive encephalopathy; etc. As part of my medical decision making, I reviewed the following data within the electronic MEDICAL RECORD NUMBER Notes from prior ED visits  ----------------------------------------- 11:22 PM on 03/15/2018 -----------------------------------------  Patient agitated but denying any suicidal or homicidal ideation.  Denying any auditory or visual hallucinations.  Patient's home meds ordered.  Nursing discussed with group home and patient has been missing over the past 2 days.  Likely noncompliant with medications.  Tele-psych consultation ordered as well as TTS.  Patient to be transferred  to [facility] at this time. ____________________________________________   FINAL CLINICAL IMPRESSION(S) / ED DIAGNOSES  Schizophrenia.  Aggressive behavior.  NEW MEDICATIONS STARTED DURING THIS VISIT:  New Prescriptions   No medications on file     Note:  This document was prepared using Dragon voice recognition software and may include unintentional dictation errors.     Orbie Pyo, MD 03/15/18 2322

## 2018-03-15 NOTE — ED Notes (Addendum)
Patient anxious and paranoid but pleasant.  Patient refuses his medications stating, "I don't take those medications.  Those are not my meds.  I don't want to go back to that group home."

## 2018-03-16 ENCOUNTER — Emergency Department: Payer: Medicare Other

## 2018-03-16 ENCOUNTER — Other Ambulatory Visit: Payer: Self-pay

## 2018-03-16 ENCOUNTER — Inpatient Hospital Stay
Admission: AD | Admit: 2018-03-16 | Discharge: 2018-03-23 | DRG: 885 | Disposition: A | Discharge status: Home / Self Care | Payer: Medicare Other | Attending: Psychiatry | Admitting: Psychiatry

## 2018-03-16 DIAGNOSIS — F203 Undifferentiated schizophrenia: Secondary | ICD-10-CM

## 2018-03-16 DIAGNOSIS — F17210 Nicotine dependence, cigarettes, uncomplicated: Secondary | ICD-10-CM | POA: Diagnosis present

## 2018-03-16 DIAGNOSIS — Z590 Homelessness: Secondary | ICD-10-CM | POA: Diagnosis not present

## 2018-03-16 DIAGNOSIS — F319 Bipolar disorder, unspecified: Secondary | ICD-10-CM | POA: Diagnosis present

## 2018-03-16 DIAGNOSIS — Z9119 Patient's noncompliance with other medical treatment and regimen: Secondary | ICD-10-CM

## 2018-03-16 DIAGNOSIS — Z91199 Patient's noncompliance with other medical treatment and regimen due to unspecified reason: Secondary | ICD-10-CM

## 2018-03-16 DIAGNOSIS — Z79899 Other long term (current) drug therapy: Secondary | ICD-10-CM | POA: Diagnosis not present

## 2018-03-16 DIAGNOSIS — Z915 Personal history of self-harm: Secondary | ICD-10-CM

## 2018-03-16 DIAGNOSIS — F200 Paranoid schizophrenia: Secondary | ICD-10-CM | POA: Diagnosis present

## 2018-03-16 DIAGNOSIS — I10 Essential (primary) hypertension: Secondary | ICD-10-CM | POA: Diagnosis present

## 2018-03-16 DIAGNOSIS — G4700 Insomnia, unspecified: Secondary | ICD-10-CM | POA: Diagnosis present

## 2018-03-16 DIAGNOSIS — R4182 Altered mental status, unspecified: Secondary | ICD-10-CM | POA: Diagnosis present

## 2018-03-16 DIAGNOSIS — F17200 Nicotine dependence, unspecified, uncomplicated: Secondary | ICD-10-CM | POA: Diagnosis present

## 2018-03-16 MED ORDER — NICOTINE 21 MG/24HR TD PT24
21.0000 mg | MEDICATED_PATCH | Freq: Every day | TRANSDERMAL | Status: DC
  Administered 2018-03-17 – 2018-03-23 (×3): 21 mg via TRANSDERMAL
  Filled 2018-03-16 (×5): qty 1

## 2018-03-16 MED ORDER — DIVALPROEX SODIUM 500 MG PO DR TAB
500.0000 mg | DELAYED_RELEASE_TABLET | Freq: Three times a day (TID) | ORAL | Status: DC
  Administered 2018-03-16 – 2018-03-23 (×20): 500 mg via ORAL
  Filled 2018-03-16 (×20): qty 1

## 2018-03-16 MED ORDER — MAGNESIUM HYDROXIDE 400 MG/5ML PO SUSP: 30.0000 mL | Freq: Every day | ORAL | Status: DC | PRN

## 2018-03-16 MED ORDER — ALUM & MAG HYDROXIDE-SIMETH 200-200-20 MG/5ML PO SUSP: 30.0000 mL | ORAL | Status: DC | PRN

## 2018-03-16 MED ORDER — OLANZAPINE 5 MG PO TABS: 15.0000 mg | ORAL_TABLET | Freq: Every day | ORAL | Status: DC

## 2018-03-16 MED ORDER — CLONAZEPAM 0.5 MG PO TABS
0.5000 mg | ORAL_TABLET | Freq: Two times a day (BID) | ORAL | Status: DC
  Administered 2018-03-16: 0.5 mg via ORAL
  Filled 2018-03-16: qty 1

## 2018-03-16 MED ORDER — OLANZAPINE 10 MG IM SOLR: 10.0000 mg | Freq: Two times a day (BID) | INTRAMUSCULAR | Status: DC | PRN

## 2018-03-16 MED ORDER — LORAZEPAM 2 MG/ML IJ SOLN: 1.0000 mg | INTRAMUSCULAR | Status: DC | PRN

## 2018-03-16 MED ORDER — TRAZODONE HCL 100 MG PO TABS
100.0000 mg | ORAL_TABLET | Freq: Every evening | ORAL | Status: DC | PRN
  Administered 2018-03-16: 100 mg via ORAL
  Filled 2018-03-16: qty 1

## 2018-03-16 MED ORDER — ACETAMINOPHEN 325 MG PO TABS: 650.0000 mg | ORAL_TABLET | Freq: Four times a day (QID) | ORAL | Status: DC | PRN

## 2018-03-16 MED ORDER — HYDROXYZINE HCL 50 MG PO TABS
50.0000 mg | ORAL_TABLET | Freq: Three times a day (TID) | ORAL | Status: DC | PRN
  Administered 2018-03-17: 50 mg via ORAL
  Filled 2018-03-16: qty 1

## 2018-03-16 MED ORDER — TRAZODONE HCL 100 MG PO TABS
100.0000 mg | ORAL_TABLET | Freq: Every day | ORAL | Status: DC
  Administered 2018-03-16 – 2018-03-17 (×2): 100 mg via ORAL
  Filled 2018-03-16 (×2): qty 1

## 2018-03-16 MED ORDER — OLANZAPINE 5 MG PO TABS
15.0000 mg | ORAL_TABLET | Freq: Two times a day (BID) | ORAL | Status: DC
  Administered 2018-03-16 – 2018-03-23 (×14): 15 mg via ORAL
  Filled 2018-03-16 (×14): qty 1

## 2018-03-16 MED ORDER — RISPERIDONE 1 MG PO TABS
2.0000 mg | ORAL_TABLET | Freq: Two times a day (BID) | ORAL | Status: DC
  Administered 2018-03-16 – 2018-03-23 (×14): 2 mg via ORAL
  Filled 2018-03-16 (×14): qty 2

## 2018-03-16 NOTE — Progress Notes (Signed)
Patient ID: Kenneth [name]., male   DOB: 07/30/1960, 57 y.o.   MRN: [id number] Per State regulations 482.30 this chart was reviewed for medical necessity with respect to the patient's admission/duration of stay.    Next review date: 03/20/18   Debarah Crape, BSN, RN-BC  Case Manager

## 2018-03-16 NOTE — BHH Suicide Risk Assessment (Signed)
[facility] Admission Suicide Risk Assessment   Nursing information obtained from:  Patient Demographic factors:  Male, Caucasian, Low socioeconomic status Current Mental Status:  NA Loss Factors:  Decline in physical health Historical Factors:  Family history of mental illness or substance abuse Risk Reduction Factors:  NA  Total Time spent with patient: 1 hour Principal Problem: Schizophrenia, undifferentiated ([place]) Diagnosis:   Patient Active Problem List   Diagnosis Date Noted  . Schizophrenia, undifferentiated ([place]) [F20.3] 03/16/2018    Priority: High  . Noncompliance [Z91.19] 03/16/2018  . Tobacco use disorder [F17.200] 03/16/2018   Subjective Data: psychotic break  Continued Clinical Symptoms:  Alcohol Use Disorder Identification Test Final Score (AUDIT): 0 The "Alcohol Use Disorders Identification Test", Guidelines for Use in Primary Care, Second Edition.  World Pharmacologist [facility]). Score between 0-7:  no or low risk or alcohol related problems. Score between 8-15:  moderate risk of alcohol related problems. Score between 16-19:  high risk of alcohol related problems. Score 20 or above:  warrants further diagnostic evaluation for alcohol dependence and treatment.   CLINICAL FACTORS:   Schizophrenia:   Paranoid or undifferentiated type Currently Psychotic Unstable or Poor Therapeutic Relationship Previous Psychiatric Diagnoses and Treatments   Musculoskeletal: Strength & Muscle Tone: within normal limits Gait & Station: normal Patient leans: N/A  Psychiatric Specialty Exam: Physical Exam  Nursing note and vitals reviewed. Psychiatric: His affect is angry, blunt and inappropriate. His speech is delayed. He is slowed, withdrawn and actively hallucinating. Thought content is paranoid and delusional. Cognition and memory are impaired. He expresses impulsivity. He is noncommunicative.    Review of Systems  Neurological: Negative.   Psychiatric/Behavioral: Positive  for hallucinations. The patient has insomnia.   All other systems reviewed and are negative.   Blood pressure 128/88, pulse 100, temperature 98.6 F (37 C), temperature source Oral, resp. rate 18, height 5\' 5"  (1.651 m), weight 76.7 kg, SpO2 99 %.Body mass index is 28.12 kg/m.  General Appearance: Disheveled  Eye Contact:  Minimal  Speech:  Blocked  Volume:  Decreased  Mood:  Dysphoric and Irritable  Affect:  Congruent  Thought Process:  Disorganized, Irrelevant and Descriptions of Associations: Loose  Orientation:  Other:  to person only  Thought Content:  Illogical, Hallucinations: Auditory and Paranoid Ideation  Suicidal Thoughts:  No  Homicidal Thoughts:  No  Memory:  Immediate;   Poor Recent;   Poor Remote;   Poor  Judgement:  Poor  Insight:  Lacking  Psychomotor Activity:  Psychomotor Retardation  Concentration:  Concentration: Poor and Attention Span: Poor  Recall:  Poor  Fund of Knowledge:  Poor  Language:  Poor  Akathisia:  No  Handed:  Right  AIMS (if indicated):     Assets:  Communication Skills Desire for Improvement Financial Resources/Insurance Physical Health Resilience  ADL's:  Intact  Cognition:  WNL  Sleep:         COGNITIVE FEATURES THAT CONTRIBUTE TO RISK:  None    SUICIDE RISK:   Minimal: No identifiable suicidal ideation.  Patients presenting with no risk factors but with morbid ruminations; may be classified as minimal risk based on the severity of the depressive symptoms  PLAN OF CARE: hospital admission, medication management, discharge planning.  Mr. Sampedro is a 89 year oldmale with a history of schizophrenia admitted for psychotic break in the context of treatment noncompliance.  #Psychosis -restart Zyprexa 15 mg BID -restart Risperdal 2 mg BID -restart Depakote 500 mg TID -Trazodone 100 mg nightly  #Smoking cessation -  nicotine patch is available  #Labs  -lipid panel, TSH, A1C -EKG  #Disposition -TBE -unclear if discharge  to his group home -follow up with his regular provider  I certify that inpatient services furnished can reasonably be expected to improve the patient's condition.   Orson Slick, MD 03/16/2018, 8:07 PM

## 2018-03-16 NOTE — ED Notes (Signed)
Hourly rounding reveals patient sleeping in room. No complaints, stable, in no acute distress. Q15 minute rounds and monitoring via Security Cameras to continue. 

## 2018-03-16 NOTE — BH Assessment (Signed)
Assessment Note  Kenneth [name]. is an 57 y.o. male who presents to the ED via EMS with an altered mental status. Pt refuses to participate in assessment stating "Come back in 24 hours". When this writer asked again a second time to complete assessment pt shook head no. Per Officer MacAdoo pt was reported missing from a group home in [place]. And g/h has been made aware of pt's location. Unable to assess SI/HI at this time.    Diagnosis: Altered Mental Status   Past Medical History:  Past Medical History:  Diagnosis Date  . Bipolar 1 disorder ([place])   . Hypertension   . Paranoid schizophrenia ([place])     History reviewed. No pertinent surgical history.  Family History: History reviewed. No pertinent family history.  Social History:  reports that he has been smoking cigarettes. He does not have any smokeless tobacco history on file. His alcohol and drug histories are not on file.  Additional Social History:     CIWA: CIWA-Ar BP: (!) 144/107 Pulse Rate: (!) 130 COWS:    Allergies: No Known Allergies  Home Medications:  (Not in a hospital admission)  OB/GYN Status:  No LMP for male patient.  General Assessment Data Assessment unable to be completed: Yes Reason for not completing assessment: Pt presents with altered mental status refusing assessment                                                            Disposition:     On Site Evaluation by:   Reviewed with Physician:    Clois Montavon D Ary Rudnick 03/16/2018 12:25 AM

## 2018-03-16 NOTE — ED Notes (Signed)
SOC called to [place].

## 2018-03-16 NOTE — ED Notes (Signed)
Dr Markus Daft called and recommended inpatient treatment.  His direct number is [phone]

## 2018-03-16 NOTE — BH Assessment (Signed)
Patient is to be admitted to [facility] by Dr. Weber Cooks.  Attending Physician will be Dr. Bary Leriche.   Patient has been assigned to room 319, by [place] Nurse T'Ywan.   Intake Paper Work has been signed and placed on patient chart.   Starleen Blue, ER Secretary    Dr. Archie Balboa, ER MD   Berton Lan, Patient's Nurse   Elberta Fortis, Patient Access.

## 2018-03-16 NOTE — Progress Notes (Signed)
57 year old male admitted to unit. Denies SI/HI, AVH and pain at this time. Patient reports that current stressors are being at odds with his family, not having food to eat and being homeless. Patient with aggressive behavior towards security officer upon admit but was pleasant to this Probation officer. Oriented patient to room and unit. Skin and contraband search completed and witnessed by [facility], RN. Skin was warm, dry and intact. No contraband found on patient nor his belongings. Admission assessment completed, fluid and nutrition offered. Patient remains safe on the unit with q 15 minute checks.

## 2018-03-16 NOTE — ED Provider Notes (Signed)
-----------------------------------------   3:40 AM on 03/16/2018 -----------------------------------------  TTS requesting EKG and chest x-ray as [facility] psychiatry recommending inpatient psychiatric admission and patient will be referred to geropsych.   ----------------------------------------- 5:23 AM on 03/16/2018 -----------------------------------------  Patient refused EKG and chest x-ray.  I am just now seeing the fax [facility] psychiatry report that states patient meets IVC criteria.  Will initiate IVC paperwork.  Medication recommendations entered in the computer.  I see that patient had an EKG around 10 PM.   Paulette Blanch, MD 03/16/18 [phone]

## 2018-03-16 NOTE — Consult Note (Signed)
[facility] Consult   Reason for Consult: Consult for this 57 year old man with chronic schizophrenia who was brought into the hospital for acting bizarrely in public and then calling EMS himself Referring Physician: Joni Fears Patient Identification: Kenneth [name]. MRN:  [id number] Principal Diagnosis: Schizophrenia, undifferentiated ([place]) Diagnosis:   Patient Active Problem List   Diagnosis Date Noted  . Schizophrenia, undifferentiated ([place]) [F20.3] 03/16/2018  . Noncompliance [Z91.19] 03/16/2018    Total Time spent with patient: 1 hour  Subjective:   Kenneth [name]. is a 57 y.o. male patient admitted with "they told me I did not fit in".  HPI: Patient interviewed chart reviewed.  57 year old man disheveled only slightly cooperative with the interview.  Patient says that he was in [name] at his group home until 2 or 3 days ago when he got into some kind of dispute with the staff there.  He claims the clinical director told him that he "did not fit in" and the patient implies this means that he was not welcome there anymore.  He says he walked off and basically walked all the way from Kenneth [name] over here to [facility].  Has not taken his medicine in a couple of days.  Denies alcohol or drug abuse.  Currently denies any intention to harm himself.  Patient is disorganized in his thinking confused and unable to articulate a clear plan for self-care.  Irritable and inappropriate in his interactions.  Social history: Patient says he does not have a guardian but has been living in a group home in [place].  Recently walked off from there.  Poor ability to take care of himself  Medical history: History of high blood pressure.  He is disheveled but does not have any obvious acute medical issues.  Substance abuse history: No history of alcohol or drug abuse  Past Psychiatric History: Patient has a history of schizophrenia versus bipolar disorder and and has had  hospitalizations in the past.  He is specifically asking for Zyprexa right now.  Information we obtained suggest that he is supposed to be on Zyprexa and Depakote as his primary psychiatric medicine.  He does have a distant history of suicide attempts and aggression and has had several hospitalizations although it sounds like it has been a few years since he was in the hospital.  Risk to Self:   Risk to Others:   Prior Inpatient Therapy:   Prior Outpatient Therapy:    Past Medical History:  Past Medical History:  Diagnosis Date  . Bipolar 1 disorder ([place])   . Hypertension   . Paranoid schizophrenia ([place])    History reviewed. No pertinent surgical history. Family History: History reviewed. No pertinent family history. Family Psychiatric  History: Does not know of any Social History:  Social History   Substance and Sexual Activity  Alcohol Use Not on file     Social History   Substance and Sexual Activity  Drug Use Not on file    Social History   Socioeconomic History  . Marital status: Single    Spouse name: Not on file  . Number of children: Not on file  . Years of education: Not on file  . Highest education level: Not on file  Occupational History  . Not on file  Social Needs  . Financial resource strain: Not on file  . Food insecurity:    Worry: Not on file    Inability: Not on file  . Transportation needs:    Medical:  Not on file    Non-medical: Not on file  Tobacco Use  . Smoking status: Current Every Day Smoker    Types: Cigarettes  Substance and Sexual Activity  . Alcohol use: Not on file  . Drug use: Not on file  . Sexual activity: Not on file  Lifestyle  . Physical activity:    Days per week: Not on file    Minutes per session: Not on file  . Stress: Not on file  Relationships  . Social connections:    Talks on phone: Not on file    Gets together: Not on file    Attends religious service: Not on file    Active member of club or organization: Not  on file    Attends meetings of clubs or organizations: Not on file    Relationship status: Not on file  Other Topics Concern  . Not on file  Social History Narrative  . Not on file   Additional Social History:    Allergies:  No Known Allergies  Labs:  Results for orders placed or performed during the hospital encounter of 03/15/18 (from the past 48 hour(s))  CBC with Differential     Status: Abnormal   Collection Time: 03/15/18  9:53 PM  Result Value Ref Range   WBC 10.7 (H) 3.8 - 10.6 K/uL   RBC 5.33 4.40 - 5.90 MIL/uL   Hemoglobin 16.5 13.0 - 18.0 g/dL   HCT 47.3 40.0 - 52.0 %   MCV 88.7 80.0 - 100.0 fL   MCH 31.0 26.0 - 34.0 pg   MCHC 35.0 32.0 - 36.0 g/dL   RDW 13.4 11.5 - 14.5 %   Platelets 179 150 - 440 K/uL   Neutrophils Relative % 66 %   Neutro Abs 7.2 (H) 1.4 - 6.5 K/uL   Lymphocytes Relative 18 %   Lymphs Abs 1.9 1.0 - 3.6 K/uL   Monocytes Relative 14 %   Monocytes Absolute 1.4 (H) 0.2 - 1.0 K/uL   Eosinophils Relative 1 %   Eosinophils Absolute 0.1 0 - 0.7 K/uL   Basophils Relative 1 %   Basophils Absolute 0.1 0 - 0.1 K/uL    Comment: Performed at [facility], [address] [place], [place] [address]  Comprehensive metabolic panel     Status: Abnormal   Collection Time: 03/15/18  9:53 PM  Result Value Ref Range   Sodium 137 135 - 145 mmol/L   Potassium 3.3 (L) 3.5 - 5.1 mmol/L   Chloride 101 98 - 111 mmol/L   CO2 24 22 - 32 mmol/L   Glucose, Bld 143 (H) 70 - 99 mg/dL   BUN 27 (H) 6 - 20 mg/dL   Creatinine, Ser 1.32 (H) 0.61 - 1.24 mg/dL   Calcium 9.1 8.9 - 10.3 mg/dL   Total Protein 7.3 6.5 - 8.1 g/dL   Albumin 4.5 3.5 - 5.0 g/dL   AST 183 (H) 15 - 41 U/L   ALT 80 (H) 0 - 44 U/L   Alkaline Phosphatase 54 38 - 126 U/L   Total Bilirubin 0.7 0.3 - 1.2 mg/dL   GFR calc non Af Amer 59 (L) >60 mL/min   GFR calc Af Amer >60 >60 mL/min    Comment: (NOTE) The eGFR has been calculated using the CKD EPI equation. This calculation has not been  validated in all clinical situations. eGFR's persistently <60 mL/min signify possible Chronic Kidney Disease.    Anion gap 12 5 - 15  Comment: Performed at [facility], [address] [place], [place] [address]  Troponin I     Status: None   Collection Time: 03/15/18  9:53 PM  Result Value Ref Range   Troponin I <0.03 <0.03 ng/mL    Comment: Performed at [facility], [address] [place], [place] [address]  Acetaminophen level     Status: Abnormal   Collection Time: 03/15/18  9:53 PM  Result Value Ref Range   Acetaminophen (Tylenol), Serum <10 (L) 10 - 30 ug/mL    Comment: (NOTE) Therapeutic concentrations vary significantly. A range of 10-30 ug/mL  may be an effective concentration for many patients. However, some  are best treated at concentrations outside of this range. Acetaminophen concentrations >150 ug/mL at 4 hours after ingestion  and >50 ug/mL at 12 hours after ingestion are often associated with  toxic reactions. Performed at [facility], [address] [place], [place] [address]   Salicylate level     Status: None   Collection Time: 03/15/18  9:53 PM  Result Value Ref Range   Salicylate Lvl <9.1 2.8 - 30.0 mg/dL    Comment: Performed at [facility], [address] [place], [place] [address]  Ethanol     Status: None   Collection Time: 03/15/18  9:53 PM  Result Value Ref Range   Alcohol, Ethyl (B) <10 <10 mg/dL    Comment: (NOTE) Lowest detectable limit for serum alcohol is 10 mg/dL. For medical purposes only. Performed at [facility], [address], [place],  [address]     Current Facility-Administered Medications  Medication Dose Route Frequency Provider Last Rate Last Dose  . clonazePAM (KLONOPIN) tablet 0.5 mg  0.5 mg Oral BID Orbie Pyo, MD      . divalproex (DEPAKOTE) DR tablet 500 mg  500 mg Oral TID Orbie Pyo, MD      . LORazepam (ATIVAN) injection 1 mg   1 mg Intramuscular Q4H PRN Paulette Blanch, MD      . OLANZapine [facility]) tablet 15 mg  15 mg Oral BID Orbie Pyo, MD      . OLANZapine [facility]) tablet 15 mg  15 mg Oral QHS Paulette Blanch, MD      . risperiDONE (RISPERDAL) tablet 2 mg  2 mg Oral BID Orbie Pyo, MD      . traZODone (DESYREL) tablet 100 mg  100 mg Oral QHS Orbie Pyo, MD       Current Outpatient Medications  Medication Sig Dispense Refill  . clonazePAM (KLONOPIN) 0.5 MG tablet Take 0.5 mg by mouth 2 (two) times daily.    . divalproex (DEPAKOTE) 500 MG DR tablet Take 500 mg by mouth 3 (three) times daily.    Marland Kitchen docusate sodium (COLACE) 100 MG capsule Take 100 mg by mouth 2 (two) times daily.    . metoprolol tartrate (LOPRESSOR) 25 MG tablet Take 25 mg by mouth 2 (two) times daily.    Marland Kitchen OLANZapine (ZYPREXA) 15 MG tablet Take 15 mg by mouth 2 (two) times daily.    . risperiDONE (RISPERDAL) 2 MG tablet Take 1 tablet (2 mg total) by mouth 2 (two) times daily. 60 tablet 0  . traZODone (DESYREL) 100 MG tablet Take 100 mg by mouth at bedtime.    . Valproic Acid (DEPAKENE) 250 MG/5ML SYRP syrup Take 5 mLs (250 mg total) by mouth 2 (two) times daily. 600 mL 0    Musculoskeletal: Strength &  Muscle Tone: within normal limits Gait & Station: normal Patient leans: N/A  Psychiatric Specialty Exam: Physical Exam  Nursing note and vitals reviewed. Constitutional: He appears well-developed and well-nourished.  HENT:  Head: Normocephalic and atraumatic.  Eyes: Pupils are equal, round, and reactive to light. Conjunctivae are normal.  Neck: Normal range of motion.  Cardiovascular: Regular rhythm and normal heart sounds.  Respiratory: Effort normal. No respiratory distress.  GI: Soft.  Musculoskeletal: Normal range of motion.  Neurological: He is alert.  Skin: Skin is warm and dry.  Psychiatric: His affect is angry and blunt. His speech is tangential. He is agitated. He is not aggressive. Thought  content is paranoid. He expresses impulsivity. He expresses suicidal ideation. He exhibits abnormal recent memory.    Review of Systems  Constitutional: Negative.   HENT: Negative.   Eyes: Negative.   Respiratory: Negative.   Cardiovascular: Negative.   Gastrointestinal: Negative.   Musculoskeletal: Negative.   Skin: Negative.   Neurological: Negative.   Psychiatric/Behavioral: Positive for depression and suicidal ideas. Negative for hallucinations, memory loss and substance abuse. The patient has insomnia. The patient is not nervous/anxious.     Blood pressure (!) 144/107, pulse (!) 130, temperature 99.3 F (37.4 C), temperature source Oral, resp. rate 18, height '5\' 5"'  (1.651 m), weight 68 kg, SpO2 93 %.Body mass index is 24.96 kg/m.  General Appearance: Disheveled  Eye Contact:  Minimal  Speech:  Garbled and Slow  Volume:  Decreased  Mood:  Dysphoric and Irritable  Affect:  Flat  Thought Process:  Disorganized  Orientation:  Full (Time, Place, and Person)  Thought Content:  Illogical, Paranoid Ideation and Rumination  Suicidal Thoughts:  No  Homicidal Thoughts:  No  Memory:  Immediate;   Fair Recent;   Poor Remote;   Poor  Judgement:  Poor  Insight:  Shallow  Psychomotor Activity:  Decreased  Concentration:  Concentration: Poor  Recall:  Poor  Fund of Knowledge:  Poor  Language:  Poor  Akathisia:  No  Handed:  Right  AIMS (if indicated):     Assets:  Desire for Improvement Housing Physical Health  ADL's:  Impaired  Cognition:  Impaired,  Mild  Sleep:        Treatment Plan Summary: Daily contact with patient to assess and evaluate symptoms and progress in treatment, Medication management and Plan Patient is irritable disorganized and psychotic.  Uncooperative.  Looking back through the old chart he is clearly off of his baseline which in the past has been described as pleasant and cooperative.  Has definitely been off his medicine for a few days.  For his own  safety the best thing would be to admit him to the psychiatric unit.  Orders will be completed to admit him to the unit downstairs.  Restart medication based on previous notes and information obtained by pharmacy.  15-minute checks in place.  Full set of labs will be done.  Disposition: Recommend psychiatric Inpatient admission when medically cleared. Supportive therapy provided about ongoing stressors.  Alethia Berthold, MD 03/16/2018 1:50 PM

## 2018-03-16 NOTE — ED Notes (Signed)
Pt refused all morning medication.  Pt stated, "I'm not taking those.  I know what Zyprexa looks like.  You're trying to give me bubble magic pills."  Dr. Weber Cooks informed that patient refused medications.

## 2018-03-16 NOTE — ED Notes (Signed)
Patient refused EKG.

## 2018-03-16 NOTE — ED Notes (Addendum)
Report given to Dr Markus Daft.

## 2018-03-16 NOTE — ED Notes (Signed)
Pt. Transferred to [facility] from ED to room 3 after screening for contraband. Report to include Situation, Background, Assessment and Recommendations from [facility]. Pt. Oriented to unit including Q15 minute rounds as well as the security cameras for their protection. Patient is alert and oriented, warm and dry in no acute distress. Patient denies SI, HI, and AVH. Pt. Encouraged to let me know if needs arise.

## 2018-03-16 NOTE — Tx Team (Signed)
Initial Treatment Plan 03/16/2018 6:26 PM [name] BBU:[id number]    PATIENT STRESSORS: Financial difficulties Other: Homeless   PATIENT STRENGTHS: Ability for insight Motivation for treatment/growth   PATIENT IDENTIFIED PROBLEMS: Depression  Homelessness                   DISCHARGE CRITERIA:  Adequate post-discharge living arrangements Improved stabilization in mood, thinking, and/or behavior  PRELIMINARY DISCHARGE PLAN: Outpatient therapy Placement in alternative living arrangements  PATIENT/FAMILY INVOLVEMENT: This treatment plan has been presented to and reviewed with the patient, Travus Oren., and/or family member.  The patient and family have been given the opportunity to ask questions and make suggestions.  Elzie Rings Phinneas Shakoor, RN 03/16/2018, 6:26 PM

## 2018-03-16 NOTE — ED Notes (Signed)
Writer present during [facility] assessment.  Patient was guarded and paranoid.  Patient forwarded little to psychiatrist.

## 2018-03-16 NOTE — ED Notes (Signed)
Pt refused to have his vital signs taken.  Pt states, "I'm not taking any medicine, why do you need to check my blood pressure?"

## 2018-03-16 NOTE — Plan of Care (Signed)
Patient newly admitted, goals not yet met. 

## 2018-03-16 NOTE — ED Notes (Signed)
PT IVC and pending Psych Inpatient.

## 2018-03-16 NOTE — ED Notes (Signed)
Patient refused chest Xray.  

## 2018-03-17 DIAGNOSIS — F203 Undifferentiated schizophrenia: Principal | ICD-10-CM

## 2018-03-17 LAB — URINALYSIS, COMPLETE (UACMP) WITH MICROSCOPIC
BACTERIA UA: NONE SEEN
Bilirubin Urine: NEGATIVE
GLUCOSE, UA: NEGATIVE mg/dL
Hgb urine dipstick: NEGATIVE
Ketones, ur: NEGATIVE mg/dL
Leukocytes, UA: NEGATIVE
Nitrite: NEGATIVE
PROTEIN: 30 mg/dL — AB
Specific Gravity, Urine: 1.027 (ref 1.005–1.030)
pH: 6 (ref 5.0–8.0)

## 2018-03-17 LAB — LIPID PANEL
CHOL/HDL RATIO: 5 ratio
CHOLESTEROL: 175 mg/dL (ref 0–200)
HDL: 35 mg/dL — ABNORMAL LOW (ref >40)
LDL Cholesterol: 116 mg/dL — ABNORMAL HIGH (ref 0–99)
TRIGLYCERIDES: 118 mg/dL (ref <150)
VLDL: 24 mg/dL (ref 0–40)

## 2018-03-17 LAB — URINE DRUG SCREEN, QUALITATIVE (ARMC ONLY)
AMPHETAMINES, UR SCREEN: NOT DETECTED
Barbiturates, Ur Screen: NOT DETECTED
Benzodiazepine, Ur Scrn: NOT DETECTED
Cannabinoid 50 Ng, Ur ~~LOC~~: NOT DETECTED
Cocaine Metabolite,Ur ~~LOC~~: NOT DETECTED
MDMA (ECSTASY) UR SCREEN: NOT DETECTED
Methadone Scn, Ur: NOT DETECTED
Opiate, Ur Screen: NOT DETECTED
Phencyclidine (PCP) Ur S: NOT DETECTED
TRICYCLIC, UR SCREEN: NOT DETECTED

## 2018-03-17 LAB — TSH: TSH: 2.893 u[IU]/mL (ref 0.350–4.500)

## 2018-03-17 NOTE — BHH Group Notes (Signed)
[facility] Group Notes:  (Nursing/MHT/Case Management/Adjunct)  Date:  03/17/2018  Time:  2:16 AM  Type of Therapy:  Group Therapy  Participation Level:  Did Not Attend   Nehemiah Settle 03/17/2018, 2:16 AM

## 2018-03-17 NOTE — Progress Notes (Signed)
Recreation Therapy Notes   Date:03/17/18  Time:9:30 am  Location:[facility] room  Behavioral response:N/A  Intervention Topic:Problem Solving  Discussion/Intervention: Patient did not attend group.  Clinical Observations/Feedback:  Patient did not attend group.          Markese Bloxham 03/17/2018 10:33 AM

## 2018-03-17 NOTE — BHH Counselor (Signed)
Adult Comprehensive Assessment  Patient ID: Kenneth [name]., male   DOB: 09-11-1960, 57 y.o.   MRN: [id number]  Information Source: Information source: Patient  Current Stressors:  Patient states their primary concerns and needs for treatment are:: need my house Patient states their goals for this hospitilization and ongoing recovery are:: getting out of here Educational / Learning stressors: none Employment / Job issues: no Family Relationships: none no family Social worker / Lack of resources (include bankruptcy): I get a check Housing / Lack of housing: need my housing Physical health (include injuries & life threatening diseases): im healthy Social relationships: none Substance abuse: none Bereavement / Loss: none  Living/Environment/Situation:  Living Arrangements: Group Home(Will need to confirm if he can return) Living conditions (as described by patient or guardian): horrible What is atmosphere in current home: Temporary  Family History:  Marital status: Single Are you sexually active?: No What is your sexual orientation?: heterosexual Has your sexual activity been affected by drugs, alcohol, medication, or emotional stress?: na Does patient have children?: Yes How many children?: 1 How is patient's relationship with their children?: Has had no contact in 21 years only contact in the last 2 months- refused to discuss further  Childhood History:  By whom was/is the patient raised?: Other (Comment)(myself) Additional childhood history information: not forthcoming Description of patient's relationship with caregiver when they were a child: not forthcoming Patient's description of current relationship with people who raised him/her: not forthcoming How were you disciplined when you got in trouble as a child/adolescent?: not forthcoming Does patient have siblings?: No Did patient suffer any verbal/emotional/physical/sexual abuse as a child?: No Did patient suffer  from severe childhood neglect?: No Has patient ever been sexually abused/assaulted/raped as an adolescent or adult?: No Was the patient ever a victim of a crime or a disaster?: No Witnessed domestic violence?: No Has patient been effected by domestic violence as an adult?: No  Education:  Highest grade of school patient has completed: no school- redirected this worker to next question Currently a Ship broker?: No Learning disability?: No  Employment/Work Situation:   Employment situation: On disability Why is patient on disability: [place] How long has patient been on disability: a long time Patient's job has been impacted by current illness: Yes Describe how patient's job has been impacted: no What is the longest time patient has a held a job?: 2 months Where was the patient employed at that time?: no Did You Receive Any Psychiatric Treatment/Services While in the [employer]?: No Are There Guns or Other Weapons in [place]?: No Are These Psychologist, educational?: (no weapons)  Museum/gallery curator Resources:   Financial resources: Teacher, early years/pre Does patient have a Programmer, applications or guardian?: Yes(not sure who mentioned  Jodelle Gross)  Alcohol/Substance Abuse:   What has been your use of drugs/alcohol within the last 12 months?: none If attempted suicide, did drugs/alcohol play a role in this?: No Alcohol/Substance Abuse Treatment Hx: Denies past history Has alcohol/substance abuse ever caused legal problems?: No  Social Support System:   Pensions consultant Support System: Poor Describe Community Support System: I was at a group home Type of faith/religion: My own damn religion How does patient's faith help to cope with current illness?: none mentioned  Leisure/Recreation:   Leisure and Hobbies: being alone  Strengths/Needs:   What is the patient's perception of their strengths?: keeping to myself Patient states they can use these personal strengths during their treatment to  contribute to their recovery: not forth  coming Patient states these barriers may affect/interfere with their treatment: no answer Patient states these barriers may affect their return to the community: He doesnt know what any barriers are- being here is a problem Other important information patient would like considered in planning for their treatment: Patient is requsting the ACT Team   Discharge Plan:   Currently receiving community mental health services: No(I want the act team I was at [facility]) Patient states concerns and preferences for aftercare planning are: ACT team Patient states they will know when they are safe and ready for discharge when: When you all let me leave Does patient have access to transportation?: No Does patient have financial barriers related to discharge medications?: No Plan for no access to transportation at discharge: [place] for living situation after discharge: (Patient reports no but remembers he has been at group home- there 6 years) Will patient be returning to same living situation after discharge?: No  Summary/Recommendations:  Patient is 57 year old male who came from a group home ( 6 years)who did see [employer] and only wishes to live alone and be with an ACT team. Patients current diagnosis Schizophrenia undifferentiated. LCSW introduced myself to patient and he agreed to answer questions. He was not forthcoming and his sentences were fragmented. Patient struggled to follow simple questions. Pt is a poor historian and redirected this worker several times throught out the interview. In addition patient stated he doesnt know why he is here, he was with [facility] and now he  wants to be with the act team. When patient asked to elaborate on a question ( education) he said none and go on to the nexr question,. He refused to answer any questions about family and stated I was not to contact Anette Riedel under any circumstance. He  declares he raised himself. Patient was asked to sign a consent form in which ACT Team and [place] and Ms Reifsteck was put on- he signed for act team only and preceeeded to scratch out all other areas on the page. He then ripped the assessment and face sheet in half and handed it back to this worker. I thanked him for his time and encouraged he come to group.   Suhaila Troiano M. 03/17/2018

## 2018-03-17 NOTE — Progress Notes (Signed)
Nursing note 7p-7a  Pt observed interacting with peers on unit this shift. Displayed a flat affect and irritable mood upon interaction with this Probation officer. Pt denies pain ,denies SI/HI, and also denies any audio or visual hallucinations at this time.complains of insomnia and requests trazodone for sleep. Pt preoccupied with his belongings that are in the locker. Wanting to go through each item and put his name on everything, Pt was redirected multiple times and educated on belongings and unit routine. Pt continued to try to walk down the hall where the lockers are located and look through the door window. Pt redirected and vistaril given for anxiety.  Pt is able to verbally contract for safety with this RN. Goal: "get my stuff and leave, when do I leave" Pt is now resting in bed with eyes closed, with no signs or symptoms of pain or distress noted. Pt continues to remain safe on the unit and is observed by rounding every 15 min. RN will continue to monitor.

## 2018-03-17 NOTE — BHH Counselor (Signed)
LCSW met with patient consent is signed for patient to be connected to ACT team. Previously at a group home  Called and left message for [phone] awaiting call back- the other number unable to reach anyone.  Completed assessment and Fl2 started.  [employer] LCSW [phone]

## 2018-03-17 NOTE — NC FL2 (Signed)
[place] LEVEL OF CARE SCREENING TOOL     IDENTIFICATION  Patient Name: Manly Nestle. Birthdate: April 13, 1961 Sex: male Admission Date (Current Location): 03/16/2018  [place] and [place] Number:  Engineering geologist and Address:  [facility], [address], [place], [place] [address]      Provider Number: [id number]  Attending Physician Name and Address:  Clovis Fredrickson, MD  Relative Name and Phone Number:       Current Level of Care: Hospital Recommended Level of Care: [place] Prior Approval Number:    Date Approved/Denied:   PASRR Number:    Discharge Plan: Domiciliary (Rest home)    Current Diagnoses: Patient Active Problem List   Diagnosis Date Noted  . Schizophrenia, undifferentiated ([facility]) 03/16/2018  . Noncompliance 03/16/2018  . Tobacco use disorder 03/16/2018    Orientation RESPIRATION BLADDER Height & Weight     Self, Situation, Place  Normal Continent Weight: 169 lb (76.7 kg) Height:  5\' 5"  (165.1 cm)  BEHAVIORAL SYMPTOMS/MOOD NEUROLOGICAL BOWEL NUTRITION STATUS      Continent Diet(normal)  AMBULATORY STATUS COMMUNICATION OF NEEDS Skin   Independent Verbally Normal                       Personal Care Assistance Level of Assistance  Bathing, Feeding, Dressing, Total care Bathing Assistance: Independent Feeding assistance: Independent Dressing Assistance: Independent Total Care Assistance: Independent   Functional Limitations Info  Sight, Hearing, Speech Sight Info: Adequate Hearing Info: Adequate Speech Info: Adequate    SPECIAL CARE FACTORS FREQUENCY                       Contractures Contractures Info: Not present    Additional Factors Info  Psychotropic     Psychotropic Info: See MAR         Current Medications (03/17/2018):  This is the current hospital active medication list Current Facility-Administered Medications  Medication Dose Route Frequency  Provider Last Rate Last Dose  . acetaminophen (TYLENOL) tablet 650 mg  650 mg Oral Q6H PRN Clapacs, John T, MD      . alum & mag hydroxide-simeth (MAALOX/MYLANTA) 200-200-20 MG/5ML suspension 30 mL  30 mL Oral Q4H PRN Clapacs, John T, MD      . divalproex (DEPAKOTE) DR tablet 500 mg  500 mg Oral TID Clapacs, Madie Reno, MD   500 mg at 03/17/18 0347  . hydrOXYzine (ATARAX/VISTARIL) tablet 50 mg  50 mg Oral TID PRN Clapacs, Madie Reno, MD      . magnesium hydroxide (MILK OF MAGNESIA) suspension 30 mL  30 mL Oral Daily PRN Clapacs, John T, MD      . nicotine (NICODERM CQ - dosed in mg/24 hours) patch 21 mg  21 mg Transdermal Daily Pucilowska, Jolanta B, MD   21 mg at 03/17/18 0829  . OLANZapine (ZYPREXA) injection 10 mg  10 mg Intramuscular BID PRN Pucilowska, Jolanta B, MD      . OLANZapine (ZYPREXA) tablet 15 mg  15 mg Oral BID Clapacs, Madie Reno, MD   15 mg at 03/17/18 0826  . risperiDONE (RISPERDAL) tablet 2 mg  2 mg Oral BID Clapacs, Madie Reno, MD   2 mg at 03/17/18 0826  . traZODone (DESYREL) tablet 100 mg  100 mg Oral QHS Clapacs, Madie Reno, MD   100 mg at 03/16/18 2112  . traZODone (DESYREL) tablet 100 mg  100 mg Oral QHS PRN  Clapacs, Madie Reno, MD   100 mg at 03/16/18 2112     Discharge Medications: Please see discharge summary for a list of discharge medications.  Relevant Imaging Results:  Relevant Lab Results:   Additional Information SSN [id number]  Joana Reamer, [place]

## 2018-03-17 NOTE — Progress Notes (Signed)
[facility] MD Progress Note  03/18/2018 1:28 PM Kenneth [name].  MRN:  [id number]  Subjective:    Kenneth [name] has a long history of schizophrenia. [facility] He walked away from a group home in [place] to arrive in [place]. Very disorganized.  Kenneth [name] met with treatment team today. He is still disorganized and difficult to follow but much better than yesterday. He is more irritable and leaves the interview room slamming doors. Demands his check and "a new chance in life". Ambivalent about going back to his group home where he has been for 6 years. Poo grooming.    Principal Problem: Schizophrenia, undifferentiated ([place]) Diagnosis:   Patient Active Problem List   Diagnosis Date Noted  . Schizophrenia, undifferentiated ([place]) [F20.3] 03/16/2018    Priority: High  . Noncompliance [Z91.19] 03/16/2018  . Tobacco use disorder [F17.200] 03/16/2018   Total Time spent with patient: 20 minutes  Past Psychiatric History: schizophrenia  Past Medical History:  Past Medical History:  Diagnosis Date  . Bipolar 1 disorder ([place])   . Hypertension   . Paranoid schizophrenia ([place])    History reviewed. No pertinent surgical history. Family History: History reviewed. No pertinent family history. Family Psychiatric  History: unknown Social History:  Social History   Substance and Sexual Activity  Alcohol Use Not Currently     Social History   Substance and Sexual Activity  Drug Use Not on file    Social History   Socioeconomic History  . Marital status: Single    Spouse name: Not on file  . Number of children: Not on file  . Years of education: Not on file  . Highest education level: Not on file  Occupational History  . Not on file  Social Needs  . Financial resource strain: Not on file  . Food insecurity:    Worry: Not on file    Inability: Not on file  . Transportation needs:    Medical: Not on file    Non-medical: Not on file   Tobacco Use  . Smoking status: Current Every Day Smoker    Types: Cigarettes  . Smokeless tobacco: Never Used  Substance and Sexual Activity  . Alcohol use: Not Currently  . Drug use: Not on file  . Sexual activity: Not on file  Lifestyle  . Physical activity:    Days per week: Not on file    Minutes per session: Not on file  . Stress: Not on file  Relationships  . Social connections:    Talks on phone: Not on file    Gets together: Not on file    Attends religious service: Not on file    Active member of club or organization: Not on file    Attends meetings of clubs or organizations: Not on file    Relationship status: Not on file  Other Topics Concern  . Not on file  Social History Narrative  . Not on file   Additional Social History:                         Sleep: Fair  Appetite:  Fair  Current Medications: Current Facility-Administered Medications  Medication Dose Route Frequency Provider Last Rate Last Dose  . acetaminophen (TYLENOL) tablet 650 mg  650 mg Oral Q6H PRN Clapacs, John T, MD      . alum & mag hydroxide-simeth (MAALOX/MYLANTA) 200-200-20 MG/5ML suspension 30 mL  30  mL Oral Q4H PRN Clapacs, John T, MD      . divalproex (DEPAKOTE) DR tablet 500 mg  500 mg Oral TID Clapacs, John T, MD   500 mg at 03/18/18 1228  . hydrOXYzine (ATARAX/VISTARIL) tablet 50 mg  50 mg Oral TID PRN Clapacs, Madie Reno, MD   50 mg at 03/17/18 2201  . magnesium hydroxide (MILK OF MAGNESIA) suspension 30 mL  30 mL Oral Daily PRN Clapacs, John T, MD      . nicotine (NICODERM CQ - dosed in mg/24 hours) patch 21 mg  21 mg Transdermal Daily Sequoyah Counterman B, MD   21 mg at 03/17/18 0829  . OLANZapine (ZYPREXA) injection 10 mg  10 mg Intramuscular BID PRN Dystany Duffy B, MD      . OLANZapine (ZYPREXA) tablet 15 mg  15 mg Oral BID Clapacs, Madie Reno, MD   15 mg at 03/18/18 0806  . risperiDONE (RISPERDAL) tablet 2 mg  2 mg Oral BID Clapacs, Madie Reno, MD   2 mg at 03/18/18 0806   . traZODone (DESYREL) tablet 100 mg  100 mg Oral QHS Clapacs, John T, MD   100 mg at 03/17/18 2100  . traZODone (DESYREL) tablet 100 mg  100 mg Oral QHS PRN Clapacs, Madie Reno, MD   100 mg at 03/16/18 2112    Lab Results:  Results for orders placed or performed during the hospital encounter of 03/16/18 (from the past 48 hour(s))  Urinalysis, Complete w Microscopic     Status: Abnormal   Collection Time: 03/17/18  6:41 AM  Result Value Ref Range   Color, Urine AMBER (A) YELLOW    Comment: BIOCHEMICALS MAY BE AFFECTED BY COLOR   APPearance CLEAR (A) CLEAR   Specific Gravity, Urine 1.027 1.005 - 1.030   pH 6.0 5.0 - 8.0   Glucose, UA NEGATIVE NEGATIVE mg/dL   Hgb urine dipstick NEGATIVE NEGATIVE   Bilirubin Urine NEGATIVE NEGATIVE   Ketones, ur NEGATIVE NEGATIVE mg/dL   Protein, ur 30 (A) NEGATIVE mg/dL   Nitrite NEGATIVE NEGATIVE   Leukocytes, UA NEGATIVE NEGATIVE   RBC / HPF 0-5 0 - 5 RBC/hpf   WBC, UA 0-5 0 - 5 WBC/hpf   Bacteria, UA NONE SEEN NONE SEEN   Squamous Epithelial / LPF 0-5 0 - 5   Mucus PRESENT     Comment: Performed at [facility], [address], [place], [place] [address]  Urine Drug Screen, Qualitative ([facility] only)     Status: None   Collection Time: 03/17/18  6:41 AM  Result Value Ref Range   Tricyclic, Ur Screen NONE DETECTED NONE DETECTED   Amphetamines, Ur Screen NONE DETECTED NONE DETECTED   MDMA (Ecstasy)Ur Screen NONE DETECTED NONE DETECTED   Cocaine Metabolite,Ur [place] NONE DETECTED NONE DETECTED   Opiate, Ur Screen NONE DETECTED NONE DETECTED   Phencyclidine (PCP) Ur S NONE DETECTED NONE DETECTED   Cannabinoid 50 Ng, Ur  NONE DETECTED NONE DETECTED   Barbiturates, Ur Screen NONE DETECTED NONE DETECTED   Benzodiazepine, Ur Scrn NONE DETECTED NONE DETECTED   Methadone Scn, Ur NONE DETECTED NONE DETECTED    Comment: (NOTE) Tricyclics + metabolites, urine    Cutoff 1000 ng/mL Amphetamines + metabolites, urine  Cutoff 1000 ng/mL MDMA (Ecstasy),  urine              Cutoff 500 ng/mL Cocaine Metabolite, urine          Cutoff 300 ng/mL Opiate + metabolites, urine  Cutoff 300 ng/mL Phencyclidine (PCP), urine         Cutoff 25 ng/mL Cannabinoid, urine                 Cutoff 50 ng/mL Barbiturates + metabolites, urine  Cutoff 200 ng/mL Benzodiazepine, urine              Cutoff 200 ng/mL Methadone, urine                   Cutoff 300 ng/mL The urine drug screen provides only a preliminary, unconfirmed analytical test result and should not be used for non-medical purposes. Clinical consideration and professional judgment should be applied to any positive drug screen result due to possible interfering substances. A more specific alternate chemical method must be used in order to obtain a confirmed analytical result. Gas chromatography / mass spectrometry (GC/MS) is the preferred confirmat ory method. Performed at [facility], [address] [place], [place] [address]     Blood Alcohol level:  Lab Results  Component Value Date   [facility] <10 03/15/2018   ETH <11 09/62/8366    Metabolic Disorder Labs: Lab Results  Component Value Date   HGBA1C 5.5 03/15/2018   MPG 111 03/15/2018   No results found for: PROLACTIN Lab Results  Component Value Date   CHOL 175 03/15/2018   TRIG 118 03/15/2018   HDL 35 (L) 03/15/2018   CHOLHDL 5.0 03/15/2018   VLDL 24 03/15/2018   LDLCALC 116 (H) 03/15/2018   LDLCALC 74 02/23/2012    Physical Findings: AIMS: Facial and Oral Movements Muscles of Facial Expression: None, normal Lips and Perioral Area: None, normal Jaw: None, normal Tongue: None, normal,Extremity Movements Upper (arms, wrists, hands, fingers): None, normal Lower (legs, knees, ankles, toes): None, normal, Trunk Movements Neck, shoulders, hips: None, normal, Overall Severity Severity of abnormal movements (highest score from questions above): None, normal Incapacitation due to abnormal movements: None,  normal Patient's awareness of abnormal movements (rate only patient's report): No Awareness, Dental Status Current problems with teeth and/or dentures?: Yes Does patient usually wear dentures?: No  CIWA:    COWS:     Musculoskeletal: Strength & Muscle Tone: within normal limits Gait & Station: normal Patient leans: N/A  Psychiatric Specialty Exam: Physical Exam  Nursing note and vitals reviewed. Psychiatric: His affect is blunt and inappropriate. He is slowed, withdrawn and actively hallucinating. Thought content is delusional. He expresses inappropriate judgment. He is noncommunicative. He exhibits abnormal recent memory and abnormal remote memory.    Review of Systems  Neurological: Negative.   Psychiatric/Behavioral: Positive for hallucinations and memory loss.  All other systems reviewed and are negative.   Blood pressure 107/83, pulse (!) 111, temperature 98.6 F (37 C), temperature source Oral, resp. rate 16, height _0  (1.651 m), weight 76.7 kg, SpO2 96 %.Body mass index is 28.12 kg/m.  General Appearance: Disheveled  Eye Contact:  Fair  Speech:  Blocked and Garbled  Volume:  Decreased  Mood:  Dysphoric and Irritable  Affect:  Congruent  Thought Process:  Disorganized, Irrelevant and Descriptions of Associations: Loose  Orientation:  Full (Time, Place, and Person)  Thought Content:  Delusions, Hallucinations: Auditory and Paranoid Ideation  Suicidal Thoughts:  No  Homicidal Thoughts:  No  Memory:  Immediate;   Poor Recent;   Poor Remote;   Poor  Judgement:  Poor  Insight:  Lacking  Psychomotor Activity:  Restlessness  Concentration:  Concentration: Poor and Attention Span: Poor  Recall:  Poor  Fund of Knowledge:  Poor  Language:  Poor  Akathisia:  No  Handed:  Right  AIMS (if indicated):     Assets:  Communication Skills Desire for Improvement Financial Resources/Insurance Physical Health Resilience  ADL's:  Impaired  Cognition:  Impaired,  Severe   Sleep:  Number of Hours: 4.75     Treatment Plan Summary: Daily contact with patient to assess and evaluate symptoms and progress in treatment and Medication management   Kenneth [name] is a 72 year oldmale with a history of schizophrenia admitted for psychotic break in the context of treatment noncompliance.  #Psychosis, improving -we continued outpatient regimen -Zyprexa 15 mg BID -Risperdal 2 mg BID -offer Invega sustenna 234 mg injection -Depakote 500 mg TID  #Insomnia, sleep poorly  -Trazodone 100 mg nightly PRN -start Restoril 15 mg nightly  #Weight loss -double portions  #Smoking cessation -nicotine patch is available  #Labs  -lipid panel, TSH, A1C are normal -EKG reviewed, sinus tachycardia with QTc 457  #Disposition -TBE -unclear if discharge to his group home -follow up with his regular provider  Orson Slick, MD 03/18/2018, 1:28 PM

## 2018-03-17 NOTE — Plan of Care (Addendum)
Patient found asleep in bed upon my arrival. Patient is isolative to room most of evening. Neither visible nor social with peers. Mood and affect are appropriate. Patient is minimally verbal but answers direct questions. Denies pain. Eating adequately. Compliant with HS medications and staff direction. Given PRN dose of Trazodone for sleep with positive results. Q 15 minute checks maintained. Will continue to monitor throughout the shift. Patient slept 7.75 hours. No apparent distress. Will endorse care to oncoming shift.  Problem: Coping: Goal: Coping ability will improve Outcome: Progressing   Problem: Health Behavior/Discharge Planning: Goal: Compliance with therapeutic regimen will improve Outcome: Progressing   Problem: Activity: Goal: Interest or engagement in leisure activities will improve Outcome: Not Progressing Goal: Imbalance in normal sleep/wake cycle will improve Outcome: Not Progressing   Problem: Coping: Goal: Will verbalize feelings Outcome: Not Progressing

## 2018-03-17 NOTE — Plan of Care (Signed)
Patient denies SI, HI and AVH. Patient very confused, making nonsensical statements at times. Patient needs constant reorientation on the unit. Patient preoccupied with locker contents and blood pressure medication from the past. Patient not attending groups; unable to demonstrate proper coping skills; unable to express education provided by nurse. Patient rates pain 0/10 but states "I touch my knee and then it can feel like ouch." No self injurious behaviors noted. Patient will continue to be monitored by nurse and 15 minute checks by MHT. Problem: Safety: Goal: Ability to disclose and discuss suicidal ideas will improve 03/17/2018 1315 by Geraldo Docker, RN Outcome: Not Progressing 03/17/2018 1304 by Geraldo Docker, RN Outcome: Not Progressing Goal: Ability to identify and utilize support systems that promote safety will improve 03/17/2018 1315 by Geraldo Docker, RN Outcome: Not Progressing 03/17/2018 1304 by Geraldo Docker, RN Outcome: Not Progressing   Problem: Education: Goal: Knowledge of General Education information will improve Description Including pain rating scale, medication(s)/side effects and non-pharmacologic comfort measures 03/17/2018 1315 by Geraldo Docker, RN Outcome: Not Progressing 03/17/2018 1304 by Geraldo Docker, RN Outcome: Not Progressing

## 2018-03-17 NOTE — H&P (Signed)
Psychiatric Admission Assessment Adult  Patient Identification: Kenneth [name]. MRN:  [id number] Date of Evaluation:  03/17/2018 Chief Complaint:  Schizophrenia Principal Diagnosis: Schizophrenia, undifferentiated ([place]) Diagnosis:   Patient Active Problem List   Diagnosis Date Noted  . Schizophrenia, undifferentiated ([place]) [F20.3] 03/16/2018    Priority: High  . Noncompliance [Z91.19] 03/16/2018  . Tobacco use disorder [F17.200] 03/16/2018   History of Present Illness:   Identifying data. Kenneth [name] is a 57 year old male with a history of schizophrenia.  Chief complaint. "I don't know you."  History of present illness. Information was obtained from the patient and the chart. The patient was brought to the ER from a local truck stop where he was noticed to behave strangely. The patient himself is unable to provide much information. Apparently, he has been staying in a group home in [place] but was told that he is "not a good fit". It is likely that he walked to [place] from [place]. He is utterly confused but did know the address of his group home. He denies any problems. He is unable to answer simple questions, his thinking and speech are completely disorganized. He is trying to write down a phone number for me to call but unable to produce a single digit. He wanders if he could use our address. For what?  Past psychiatric history. The patient does not recognize me but I used to be his psychiatrist at [facility], state facility, in [place] over ten years ago. He was on "long term" unit and at that time, he still had family contact.  Family psychiatric history. Unknown.  Social history. Disabled from mental illness, likely homeless now.  Total Time spent with patient: 1 hour  Is the patient at risk to self? No.  Has the patient been a risk to self in the past 6 months? No.  Has the patient been a risk to self within the distant past? Yes.    Is the patient a risk to others? No.   Has the patient been a risk to others in the past 6 months? No.  Has the patient been a risk to others within the distant past? No.   Prior Inpatient Therapy:   Prior Outpatient Therapy:    Alcohol Screening: 1. How often do you have a drink containing alcohol?: Never 2. How many drinks containing alcohol do you have on a typical day when you are drinking?: 1 or 2 3. How often do you have six or more drinks on one occasion?: Never AUDIT-C Score: 0 4. How often during the last year have you found that you were not able to stop drinking once you had started?: Never 5. How often during the last year have you failed to do what was normally expected from you becasue of drinking?: Never 6. How often during the last year have you needed a first drink in the morning to get yourself going after a heavy drinking session?: Never 7. How often during the last year have you had a feeling of guilt of remorse after drinking?: Never 8. How often during the last year have you been unable to remember what happened the night before because you had been drinking?: Never 9. Have you or someone else been injured as a result of your drinking?: No 10. Has a relative or friend or a doctor or another health worker been concerned about your drinking or suggested you cut down?: No Alcohol Use Disorder Identification Test Final Score (AUDIT): 0 Intervention/Follow-up: AUDIT Score <7 follow-up  not indicated Substance Abuse History in the last 12 months:  No. Consequences of Substance Abuse: NA Previous Psychotropic Medications: Yes  Psychological Evaluations: No  Past Medical History:  Past Medical History:  Diagnosis Date  . Bipolar 1 disorder ([place])   . Hypertension   . Paranoid schizophrenia ([place])    History reviewed. No pertinent surgical history. Family History: History reviewed. No pertinent family history.  Tobacco Screening: Have you used any form of tobacco in the last 30 days? (Cigarettes, Smokeless  Tobacco, Cigars, and/or Pipes): Yes Tobacco use, Select all that apply: 5 or more cigarettes per day Are you interested in Tobacco Cessation Medications?: (S) No, patient refused("No those patches made me want to smoke more.") Counseled patient on smoking cessation including recognizing danger situations, developing coping skills and basic information about quitting provided: Refused/Declined practical counseling Social History:  Social History   Substance and Sexual Activity  Alcohol Use Not Currently     Social History   Substance and Sexual Activity  Drug Use Not on file    Additional Social History: Marital status: Single Are you sexually active?: No What is your sexual orientation?: heterosexual Has your sexual activity been affected by drugs, alcohol, medication, or emotional stress?: na Does patient have children?: Yes How many children?: 1 How is patient's relationship with their children?: Has had no contact in 21 years only contact in the last 2 months- refused to discuss further                         Allergies:  No Known Allergies Lab Results:  Results for orders placed or performed during the hospital encounter of 03/16/18 (from the past 48 hour(s))  Lipid panel     Status: Abnormal   Collection Time: 03/15/18  9:53 PM  Result Value Ref Range   Cholesterol 175 0 - 200 mg/dL   Triglycerides 118 <150 mg/dL   HDL 35 (L) >40 mg/dL   Total CHOL/HDL Ratio 5.0 RATIO   VLDL 24 0 - 40 mg/dL   LDL Cholesterol 116 (H) 0 - 99 mg/dL    Comment:        Total Cholesterol/HDL:CHD Risk Coronary Heart Disease Risk Table                     Men   Women  1/2 Average Risk   3.4   3.3  Average Risk       5.0   4.4  2 X Average Risk   9.6   7.1  3 X Average Risk  23.4   11.0        Use the calculated Patient Ratio above and the CHD Risk Table to determine the patient's CHD Risk.        ATP III CLASSIFICATION (LDL):  <100     mg/dL   Optimal  100-129  mg/dL   Near  or Above                    Optimal  130-159  mg/dL   Borderline  160-189  mg/dL   High  >190     mg/dL   Very High Performed at [facility], [address] [place], [place] [address]   TSH     Status: None   Collection Time: 03/15/18  9:53 PM  Result Value Ref Range   TSH 2.893 0.350 - 4.500 uIU/mL    Comment: Performed by a 3rd  Generation assay with a functional sensitivity of <=0.01 uIU/mL. Performed at [facility], [address] [place], [place] [address]   Urinalysis, Complete w Microscopic     Status: Abnormal   Collection Time: 03/17/18  6:41 AM  Result Value Ref Range   Color, Urine AMBER (A) YELLOW    Comment: BIOCHEMICALS MAY BE AFFECTED BY COLOR   APPearance CLEAR (A) CLEAR   Specific Gravity, Urine 1.027 1.005 - 1.030   pH 6.0 5.0 - 8.0   Glucose, UA NEGATIVE NEGATIVE mg/dL   Hgb urine dipstick NEGATIVE NEGATIVE   Bilirubin Urine NEGATIVE NEGATIVE   Ketones, ur NEGATIVE NEGATIVE mg/dL   Protein, ur 30 (A) NEGATIVE mg/dL   Nitrite NEGATIVE NEGATIVE   Leukocytes, UA NEGATIVE NEGATIVE   RBC / HPF 0-5 0 - 5 RBC/hpf   WBC, UA 0-5 0 - 5 WBC/hpf   Bacteria, UA NONE SEEN NONE SEEN   Squamous Epithelial / LPF 0-5 0 - 5   Mucus PRESENT     Comment: Performed at [facility], [address] [place], [place] [address]  Urine Drug Screen, Qualitative ([facility] only)     Status: None   Collection Time: 03/17/18  6:41 AM  Result Value Ref Range   Tricyclic, Ur Screen NONE DETECTED NONE DETECTED   Amphetamines, Ur Screen NONE DETECTED NONE DETECTED   MDMA (Ecstasy)Ur Screen NONE DETECTED NONE DETECTED   Cocaine Metabolite,Ur [place] NONE DETECTED NONE DETECTED   Opiate, Ur Screen NONE DETECTED NONE DETECTED   Phencyclidine (PCP) Ur S NONE DETECTED NONE DETECTED   Cannabinoid 50 Ng, Ur [place] NONE DETECTED NONE DETECTED   Barbiturates, Ur Screen NONE DETECTED NONE DETECTED   Benzodiazepine, Ur Scrn NONE DETECTED NONE DETECTED   Methadone Scn, Ur NONE  DETECTED NONE DETECTED    Comment: (NOTE) Tricyclics + metabolites, urine    Cutoff 1000 ng/mL Amphetamines + metabolites, urine  Cutoff 1000 ng/mL MDMA (Ecstasy), urine              Cutoff 500 ng/mL Cocaine Metabolite, urine          Cutoff 300 ng/mL Opiate + metabolites, urine        Cutoff 300 ng/mL Phencyclidine (PCP), urine         Cutoff 25 ng/mL Cannabinoid, urine                 Cutoff 50 ng/mL Barbiturates + metabolites, urine  Cutoff 200 ng/mL Benzodiazepine, urine              Cutoff 200 ng/mL Methadone, urine                   Cutoff 300 ng/mL The urine drug screen provides only a preliminary, unconfirmed analytical test result and should not be used for non-medical purposes. Clinical consideration and professional judgment should be applied to any positive drug screen result due to possible interfering substances. A more specific alternate chemical method must be used in order to obtain a confirmed analytical result. Gas chromatography / mass spectrometry (GC/MS) is the preferred confirmat ory method. Performed at [facility], [address] [place], [place] [address]     Blood Alcohol level:  Lab Results  Component Value Date   [facility] <10 03/15/2018   ETH <11 62/70/3500    Metabolic Disorder Labs:  No results found for: HGBA1C, MPG No results found for: PROLACTIN Lab Results  Component Value Date   CHOL 175 03/15/2018   TRIG  118 03/15/2018   HDL 35 (L) 03/15/2018   CHOLHDL 5.0 03/15/2018   VLDL 24 03/15/2018   LDLCALC 116 (H) 03/15/2018   LDLCALC 74 02/23/2012    Current Medications: Current Facility-Administered Medications  Medication Dose Route Frequency Provider Last Rate Last Dose  . acetaminophen (TYLENOL) tablet 650 mg  650 mg Oral Q6H PRN Clapacs, John T, MD      . alum & mag hydroxide-simeth (MAALOX/MYLANTA) 200-200-20 MG/5ML suspension 30 mL  30 mL Oral Q4H PRN Clapacs, John T, MD      . divalproex (DEPAKOTE) DR tablet 500 mg  500  mg Oral TID Clapacs, Madie Reno, MD   500 mg at 03/17/18 0867  . hydrOXYzine (ATARAX/VISTARIL) tablet 50 mg  50 mg Oral TID PRN Clapacs, Madie Reno, MD      . magnesium hydroxide (MILK OF MAGNESIA) suspension 30 mL  30 mL Oral Daily PRN Clapacs, John T, MD      . nicotine (NICODERM CQ - dosed in mg/24 hours) patch 21 mg  21 mg Transdermal Daily Joellyn Grandt B, MD   21 mg at 03/17/18 0829  . OLANZapine (ZYPREXA) injection 10 mg  10 mg Intramuscular BID PRN Keshonda Monsour B, MD      . OLANZapine (ZYPREXA) tablet 15 mg  15 mg Oral BID Clapacs, Madie Reno, MD   15 mg at 03/17/18 0826  . risperiDONE (RISPERDAL) tablet 2 mg  2 mg Oral BID Clapacs, Madie Reno, MD   2 mg at 03/17/18 0826  . traZODone (DESYREL) tablet 100 mg  100 mg Oral QHS Clapacs, Madie Reno, MD   100 mg at 03/16/18 2112  . traZODone (DESYREL) tablet 100 mg  100 mg Oral QHS PRN Clapacs, Madie Reno, MD   100 mg at 03/16/18 2112   PTA Medications: Medications Prior to Admission  Medication Sig Dispense Refill Last Dose  . clonazePAM (KLONOPIN) 0.5 MG tablet Take 0.5 mg by mouth 2 (two) times daily.   06/02/2013 at Unknown time  . divalproex (DEPAKOTE) 500 MG DR tablet Take 500 mg by mouth 3 (three) times daily.   06/02/2013 at 1600  . docusate sodium (COLACE) 100 MG capsule Take 100 mg by mouth 2 (two) times daily.   06/02/2013 at Unknown time  . metoprolol tartrate (LOPRESSOR) 25 MG tablet Take 25 mg by mouth 2 (two) times daily.   06/02/2013 at 0800  . OLANZapine (ZYPREXA) 15 MG tablet Take 15 mg by mouth 2 (two) times daily.   06/02/2013 at 0800  . risperiDONE (RISPERDAL) 2 MG tablet Take 1 tablet (2 mg total) by mouth 2 (two) times daily. 60 tablet 0   . traZODone (DESYREL) 100 MG tablet Take 100 mg by mouth at bedtime.   06/01/2013 at Unknown time  . Valproic Acid (DEPAKENE) 250 MG/5ML SYRP syrup Take 5 mLs (250 mg total) by mouth 2 (two) times daily. 600 mL 0     Musculoskeletal: Strength & Muscle Tone: within normal limits Gait &  Station: normal Patient leans: N/A  Psychiatric Specialty Exam: Physical Exam  Vitals reviewed. Constitutional: He appears well-developed and well-nourished.  HENT:  Head: Normocephalic and atraumatic.  Eyes: Pupils are equal, round, and reactive to light. Conjunctivae and EOM are normal.  Neck: Normal range of motion. Neck supple.  Cardiovascular: Normal rate, regular rhythm and normal heart sounds.  Respiratory: Effort normal and breath sounds normal.  GI: Soft. Bowel sounds are normal.  Musculoskeletal: Normal range of motion.  Neurological: He is alert.  Skin: Skin is warm and dry.  Psychiatric: His affect is angry and inappropriate. His speech is delayed. He is slowed, withdrawn and actively hallucinating. Thought content is paranoid and delusional. Cognition and memory are impaired. He expresses impulsivity. He is noncommunicative.    Review of Systems  Neurological: Negative.   Psychiatric/Behavioral: Positive for hallucinations. The patient has insomnia.   All other systems reviewed and are negative.   Blood pressure 131/84, pulse (!) 109, temperature 98.6 F (37 C), temperature source Oral, resp. rate 16, height 5\' 5"  (1.651 m), weight 76.7 kg, SpO2 98 %.Body mass index is 28.12 kg/m.  See SRA                                                  Sleep:  Number of Hours: 7.75    Treatment Plan Summary: Daily contact with patient to assess and evaluate symptoms and progress in treatment and Medication management    Mr. Kervin is a 56 year oldmale with a history of schizophrenia admitted for psychotic break in the context of treatment noncompliance.  #Psychosis -restart Zyprexa 15 mg BID -restart Risperdal 2 mg BID -restart Depakote 500 mg TID -Trazodone 100 mg nightly  #Smoking cessation -nicotine patch is available  #Labs  -lipid panel, TSH, A1C -EKG  #Disposition -TBE -unclear if discharge to his group home -follow up with his regular  provider   Observation Level/Precautions:  15 minute checks  Laboratory:  CBC Chemistry Profile UDS UA  Psychotherapy:    Medications:    Consultations:    Discharge Concerns:    Estimated LOS:  Other:     Physician Treatment Plan for Primary Diagnosis: Schizophrenia, undifferentiated ([place]) Long Term Goal(s): Improvement in symptoms so as ready for discharge  Short Term Goals: Ability to identify changes in lifestyle to reduce recurrence of condition will improve, Ability to verbalize feelings will improve, Ability to disclose and discuss suicidal ideas, Ability to demonstrate self-control will improve, Ability to identify and develop effective coping behaviors will improve, Ability to maintain clinical measurements within normal limits will improve, Compliance with prescribed medications will improve and Ability to identify triggers associated with substance abuse/mental health issues will improve  Physician Treatment Plan for Secondary Diagnosis: Principal Problem:   Schizophrenia, undifferentiated ([place]) Active Problems:   Noncompliance   Tobacco use disorder  Long Term Goal(s): Improvement in symptoms so as ready for discharge  Short Term Goals: NA  I certify that inpatient services furnished can reasonably be expected to improve the patient's condition.    Orson Slick, MD 10/3/201911:29 AM

## 2018-03-17 NOTE — [facility] Group Notes (Signed)
LCSW Group Therapy Note  03/17/2018 1:00pm  Type of Therapy/Topic:  Group Therapy:  Balance in Life  Participation Level:  Did Not Attend  Description of Group:    This group will address the concept of balance and how it feels and looks when one is unbalanced. Patients will be encouraged to process areas in their lives that are out of balance and identify reasons for remaining unbalanced. Facilitators will guide patients in utilizing problem-solving interventions to address and correct the stressor making their life unbalanced. Understanding and applying boundaries will be explored and addressed for obtaining and maintaining a balanced life. Patients will be encouraged to explore ways to assertively make their unbalanced needs known to significant others in their lives, using other group members and facilitator for support and feedback.  Therapeutic Goals: 1. Patient will identify two or more emotions or situations they have that consume much of in their lives. 2. Patient will identify signs/triggers that life has become out of balance:  3. Patient will identify two ways to set boundaries in order to achieve balance in their lives:  4. Patient will demonstrate ability to communicate their needs through discussion and/or role plays  Summary of Patient Progress:      Therapeutic Modalities:   Cognitive Behavioral Therapy Solution-Focused Therapy Assertiveness Training  Joana Reamer, [place] 03/17/2018 3:14 PM

## 2018-03-17 NOTE — [facility] Suicide Risk Assessment (Addendum)
[facility] INPATIENT:  Family/Significant Other Suicide Prevention Education  Suicide Prevention Education: Spoke with patient and he refused this worker to contact any family member. Patient Refusal for Family/Significant Other Suicide Prevention Education: The patient Kenneth [name]. has refused to provide written consent for family/significant other to be provided Family/Significant Other Suicide Prevention Education during admission and/or prior to discharge.  Physician notified.  [place] 03/17/2018, 10:44 AM

## 2018-03-18 LAB — HEMOGLOBIN A1C
HEMOGLOBIN A1C: 5.5 % (ref 4.8–5.6)
Mean Plasma Glucose: 111 mg/dL

## 2018-03-18 MED ORDER — TEMAZEPAM 15 MG PO CAPS
15.0000 mg | ORAL_CAPSULE | Freq: Every day | ORAL | Status: DC
  Administered 2018-03-18 – 2018-03-19 (×2): 15 mg via ORAL
  Filled 2018-03-18 (×2): qty 1

## 2018-03-18 MED ORDER — PALIPERIDONE PALMITATE ER 234 MG/1.5ML IM SUSY
234.0000 mg | PREFILLED_SYRINGE | INTRAMUSCULAR | Status: DC
  Administered 2018-03-18: 234 mg via INTRAMUSCULAR
  Filled 2018-03-18 (×2): qty 1.5

## 2018-03-18 NOTE — Plan of Care (Signed)
Did not participate in group activities but cooperative and compliant with treatment.

## 2018-03-18 NOTE — Progress Notes (Signed)
Patient was in room at the beginning of this shift. Alert and oriented. Disheveled with poor  hygiene and body odor. Presented to the medication room and had a conversation with this Probation officer, denying hallucinations and thoughts of self harm. Was encouraged to eat a snack and perform hygiene. Patient was able to take a shower and change clothing. Received his scheduled Invega Sustenna, 234 mg IM in the Right Deltoid and tolerated well. Patient was educated on the medications and mentioned that he experiences difficulties going to his provider for his shots. Requesting to talk to case management regarding home and outpatient services. He is willing to remain compliant with this medication. Patient did not have any other concern. Currently in bed falling asleep. Staff continue to monitor for safety and other needs.

## 2018-03-18 NOTE — Progress Notes (Signed)
Recreation Therapy Notes  Date: 03/18/2018  Time: 9:30 am   Location: Craft room   Behavioral response: N/A   Intervention Topic: Creative Expressions  Discussion/Intervention: Patient did not attend group.   Clinical Observations/Feedback:  Patient did not attend group.   Karis Emig LRT/CTRS        Nea Gittens 03/18/2018 11:31 AM

## 2018-03-18 NOTE — Tx Team (Addendum)
Interdisciplinary Treatment and Diagnostic Plan Update  03/18/2018 Time of Session: 10:30am Kenneth [name]. MRN: [id number]  Principal Diagnosis: Schizophrenia, undifferentiated ([place])  Secondary Diagnoses: Principal Problem:   Schizophrenia, undifferentiated ([place]) Active Problems:   Noncompliance   Tobacco use disorder   Current Medications:  Current Facility-Administered Medications  Medication Dose Route Frequency Provider Last Rate Last Dose  . acetaminophen (TYLENOL) tablet 650 mg  650 mg Oral Q6H PRN Clapacs, John T, MD      . alum & mag hydroxide-simeth (MAALOX/MYLANTA) 200-200-20 MG/5ML suspension 30 mL  30 mL Oral Q4H PRN Clapacs, John T, MD      . divalproex (DEPAKOTE) DR tablet 500 mg  500 mg Oral TID Clapacs, John T, MD   500 mg at 03/18/18 9937  . hydrOXYzine (ATARAX/VISTARIL) tablet 50 mg  50 mg Oral TID PRN Clapacs, Madie Reno, MD   50 mg at 03/17/18 2201  . magnesium hydroxide (MILK OF MAGNESIA) suspension 30 mL  30 mL Oral Daily PRN Clapacs, John T, MD      . nicotine (NICODERM CQ - dosed in mg/24 hours) patch 21 mg  21 mg Transdermal Daily Pucilowska, Jolanta B, MD   21 mg at 03/17/18 0829  . OLANZapine (ZYPREXA) injection 10 mg  10 mg Intramuscular BID PRN Pucilowska, Jolanta B, MD      . OLANZapine (ZYPREXA) tablet 15 mg  15 mg Oral BID Clapacs, Madie Reno, MD   15 mg at 03/18/18 0806  . risperiDONE (RISPERDAL) tablet 2 mg  2 mg Oral BID Clapacs, Madie Reno, MD   2 mg at 03/18/18 0806  . traZODone (DESYREL) tablet 100 mg  100 mg Oral QHS Clapacs, John T, MD   100 mg at 03/17/18 2100  . traZODone (DESYREL) tablet 100 mg  100 mg Oral QHS PRN Clapacs, Madie Reno, MD   100 mg at 03/16/18 2112   PTA Medications: Medications Prior to Admission  Medication Sig Dispense Refill Last Dose  . clonazePAM (KLONOPIN) 0.5 MG tablet Take 0.5 mg by mouth 2 (two) times daily.   06/02/2013 at Unknown time  . divalproex (DEPAKOTE) 500 MG DR tablet Take 500 mg by mouth 3 (three) times daily.    06/02/2013 at 1600  . docusate sodium (COLACE) 100 MG capsule Take 100 mg by mouth 2 (two) times daily.   06/02/2013 at Unknown time  . metoprolol tartrate (LOPRESSOR) 25 MG tablet Take 25 mg by mouth 2 (two) times daily.   06/02/2013 at 0800  . OLANZapine (ZYPREXA) 15 MG tablet Take 15 mg by mouth 2 (two) times daily.   06/02/2013 at 0800  . risperiDONE (RISPERDAL) 2 MG tablet Take 1 tablet (2 mg total) by mouth 2 (two) times daily. 60 tablet 0   . traZODone (DESYREL) 100 MG tablet Take 100 mg by mouth at bedtime.   06/01/2013 at Unknown time  . Valproic Acid (DEPAKENE) 250 MG/5ML SYRP syrup Take 5 mLs (250 mg total) by mouth 2 (two) times daily. 600 mL 0     Patient Stressors: Financial difficulties Other: Homeless  Patient Strengths: Ability for insight Motivation for treatment/growth  Treatment Modalities: Medication Management, Group therapy, Case management,  1 to 1 session with clinician, Psychoeducation, Recreational therapy.   Physician Treatment Plan for Primary Diagnosis: Schizophrenia, undifferentiated ([place]) Long Term Goal(s): Improvement in symptoms so as ready for discharge Improvement in symptoms so as ready for discharge   Short Term Goals: Ability to identify changes in lifestyle to reduce recurrence of  condition will improve Ability to verbalize feelings will improve Ability to disclose and discuss suicidal ideas Ability to demonstrate self-control will improve Ability to identify and develop effective coping behaviors will improve Ability to maintain clinical measurements within normal limits will improve Compliance with prescribed medications will improve Ability to identify triggers associated with substance abuse/mental health issues will improve NA  Medication Management: Evaluate patient's response, side effects, and tolerance of medication regimen.  Therapeutic Interventions: 1 to 1 sessions, Unit Group sessions and Medication administration.  Evaluation  of Outcomes: Progressing  Physician Treatment Plan for Secondary Diagnosis: Principal Problem:   Schizophrenia, undifferentiated ([place]) Active Problems:   Noncompliance   Tobacco use disorder  Long Term Goal(s): Improvement in symptoms so as ready for discharge Improvement in symptoms so as ready for discharge   Short Term Goals: Ability to identify changes in lifestyle to reduce recurrence of condition will improve Ability to verbalize feelings will improve Ability to disclose and discuss suicidal ideas Ability to demonstrate self-control will improve Ability to identify and develop effective coping behaviors will improve Ability to maintain clinical measurements within normal limits will improve Compliance with prescribed medications will improve Ability to identify triggers associated with substance abuse/mental health issues will improve NA     Medication Management: Evaluate patient's response, side effects, and tolerance of medication regimen.  Therapeutic Interventions: 1 to 1 sessions, Unit Group sessions and Medication administration.  Evaluation of Outcomes: Progressing   RN Treatment Plan for Primary Diagnosis: Schizophrenia, undifferentiated ([place]) Long Term Goal(s): Knowledge of disease and therapeutic regimen to maintain health will improve  Short Term Goals: Ability to demonstrate self-control, Ability to identify and develop effective coping behaviors will improve and Compliance with prescribed medications will improve  Medication Management: RN will administer medications as ordered by provider, will assess and evaluate patient's response and provide education to patient for prescribed medication. RN will report any adverse and/or side effects to prescribing provider.  Therapeutic Interventions: 1 on 1 counseling sessions, Psychoeducation, Medication administration, Evaluate responses to treatment, Monitor vital signs and CBGs as ordered, Perform/monitor CIWA, COWS,  AIMS and Fall Risk screenings as ordered, Perform wound care treatments as ordered.  Evaluation of Outcomes: Progressing   LCSW Treatment Plan for Primary Diagnosis: Schizophrenia, undifferentiated ([place]) Long Term Goal(s): Safe transition to appropriate next level of care at discharge, Engage patient in therapeutic group addressing interpersonal concerns.  Short Term Goals: Engage patient in aftercare planning with referrals and resources, Increase social support, Increase emotional regulation, Identify triggers associated with mental health/substance abuse issues and Increase skills for wellness and recovery  Therapeutic Interventions: Assess for all discharge needs, 1 to 1 time with Social worker, Explore available resources and support systems, Assess for adequacy in community support network, Educate family and significant other(s) on suicide prevention, Complete Psychosocial Assessment, Interpersonal group therapy.  Evaluation of Outcomes: Progressing   Progress in Treatment: Attending groups: No. Participating in groups: No. Taking medication as prescribed: Yes. Toleration medication: Yes. Family/Significant other contact made: No, will contact:  Patient refused Patient understands diagnosis: Yes. Discussing patient identified problems/goals with staff: Yes. Medical problems stabilized or resolved: Yes. Denies suicidal/homicidal ideation: Yes. Issues/concerns per patient self-inventory: No. Other:   New problem(s) identified: No, Describe:  None  New Short Term/Long Term Goal(s): "I want my money."  Patient Goals:  "I want my money."  Discharge Plan or Barriers: Patient will be discharged to old group home, new group home or shelter and will follow up with outpatient provider.  Reason for Continuation of Hospitalization: Medication stabilization  Estimated Length of Stay: 5-7 days    Attendees: Patient: Kenneth [name] 03/18/2018 11:28 AM  Physician: Dr. Vernice Jefferson, MD  03/18/2018 11:28 AM  Nursing: West Pugh RN 03/18/2018 11:28 AM  RN Care Manager: 03/18/2018 11:28 AM  Social Worker: Darin Engels, [place] 03/18/2018 11:28 AM  Recreational Therapist: Isaias Sakai. Marcello Fennel, LRT 03/18/2018 11:28 AM  Other: Derrek Gu, LCSW 03/18/2018 11:28 AM  Other:  03/18/2018 11:28 AM  Other: 03/18/2018 11:28 AM    Scribe for Treatment Team: Darin Engels, LCSW 03/18/2018 11:28 AM

## 2018-03-18 NOTE — Progress Notes (Signed)
Recreation Therapy Notes  INPATIENT RECREATION THERAPY ASSESSMENT  Patient Details Name: Kenneth [name]. MRN: [id number] DOB: Feb 14, 1961 Today's Date: 03/18/2018       Information Obtained From: Patient(Patient is confused and unable to focus on the topic to complete assessment.)  Able to Participate in Assessment/Interview:    Patient Presentation:    Reason for Admission (Per Patient):    Patient Stressors:    Coping Skills:      Leisure Interests (2+):     Frequency of Recreation/Participation:    Awareness of Community Resources:     [employer]:     Current Use:    If no, Barriers?:    Expressed Interest in [place] of Residence:     Patient Main Form of Transportation:    Patient Strengths:     Patient Identified Areas of Improvement:     Patient Goal for Hospitalization:     Current SI (including self-harm):     Current HI:     Current AVH:    Staff Intervention Plan:    Consent to Intern Participation:    Navaya Wiatrek 03/18/2018, 1:12 PM

## 2018-03-18 NOTE — Plan of Care (Signed)
Patient was pleasant when getting morning medications, and was compliant with medications. Patient didn't take nicotine patch sating, "it makes me crave a cigarette". Patient stated he wanted to talk to the doctor about "a lot of things today". Patient stated they slept well and that they had breakfast. Going to continue to monitor patient Q 15 min per unit protocol.   Problem: Education: Goal: Utilization of techniques to improve thought processes will improve Outcome: Progressing Goal: Knowledge of the prescribed therapeutic regimen will improve Outcome: Progressing   Problem: Activity: Goal: Interest or engagement in leisure activities will improve Outcome: Progressing Goal: Imbalance in normal sleep/wake cycle will improve Outcome: Progressing   Problem: Coping: Goal: Coping ability will improve Outcome: Progressing Goal: Will verbalize feelings Outcome: Progressing   Problem: Health Behavior/Discharge Planning: Goal: Ability to make decisions will improve Outcome: Progressing Goal: Compliance with therapeutic regimen will improve Outcome: Progressing   Problem: Role Relationship: Goal: Will demonstrate positive changes in social behaviors and relationships Outcome: Progressing   Problem: Self-Concept: Goal: Will verbalize positive feelings about self Outcome: Progressing Goal: Level of anxiety will decrease Outcome: Progressing

## 2018-03-18 NOTE — Progress Notes (Signed)
Patient was compliant with medications all day long. There is an order for an Invega IM that hasn't come from the pharmacy so the medication has been moved to 2200. A note was sent to the pharmacy about the medication not being on the unit.

## 2018-03-18 NOTE — BHH Group Notes (Signed)
[place] Group Notes:  (Nursing/MHT/Case Management/Adjunct)  Date:  03/18/2018  Time:  9:41 AM  Type of Therapy:  Psychoeducational Skills  Participation Level:  Did Not Attend  Kathi Ludwig 03/18/2018, 9:41 AM

## 2018-03-18 NOTE — Plan of Care (Signed)
  Problem: Education: Goal: Knowledge of the prescribed therapeutic regimen will improve Outcome: Progressing   Problem: Activity: Goal: Interest or engagement in leisure activities will improve Outcome: Progressing   Problem: Coping: Goal: Will verbalize feelings Outcome: Progressing   Problem: Health Behavior/Discharge Planning: Goal: Ability to make decisions will improve Outcome: Progressing   Problem: Role Relationship: Goal: Will demonstrate positive changes in social behaviors and relationships Outcome: Progressing   Problem: Safety: Goal: Ability to disclose and discuss suicidal ideas will improve Outcome: Progressing Goal: Ability to identify and utilize support systems that promote safety will improve Outcome: Progressing   Problem: Self-Concept: Goal: Will verbalize positive feelings about self Outcome: Progressing   Problem: Education: Goal: Knowledge of General Education information will improve Description Including pain rating scale, medication(s)/side effects and non-pharmacologic comfort measures Outcome: Progressing

## 2018-03-18 NOTE — BHH Group Notes (Signed)
  03/18/2018 1:00pm  Type of Therapy and Topic:  Group Therapy:  Feelings around Relapse and Recovery  Participation Level:  Did Not Attend   Description of Group:    Patients in this group will discuss emotions they experience before and after a relapse. They will process how experiencing these feelings, or avoidance of experiencing them, relates to having a relapse. Facilitator will guide patients to explore emotions they have related to recovery. Patients will be encouraged to process which emotions are more powerful. They will be guided to discuss the emotional reaction significant others in their lives may have to their relapse or recovery. Patients will be assisted in exploring ways to respond to the emotions of others without this contributing to a relapse.  Therapeutic Goals: Patient will identify two or more emotions that lead to a relapse for them Patient will identify two emotions that result when they relapse Patient will identify two emotions related to recovery Patient will demonstrate ability to communicate their needs through discussion and/or role plays  Patient did attempt to come to the door and said hi to this worker but then went back to bed.  Ravenne Wayment LCSW

## 2018-03-19 NOTE — Progress Notes (Signed)
[facility] MD Progress Note  03/19/2018 5:05 PM Kenneth [name].  MRN:  [id number]  Subjective:   Kenneth [name] has a long history of schizophrenia. Dr[facility] Mamie Nick [facility] Kenneth [name] walked away from a group home in [place] to arrive in [place]. Very disorganized.  Kenneth [name] is wandering on the hall, and often knock's on the office door or at the RN station. Kenneth [name] has one questions only, which is "when can I leave". Kenneth [name] is told multiple times and repeatedly that Kenneth [name] needs to discuss that with his whole treatment team on Monday.  Kenneth [name] said that Kenneth [name] wants to stay in [place] and Not go back to [place], and again, Kenneth [name] is instructed to discuss that with treatment team on Monday.  But a couple hours later, Kenneth [name] knocked on the office door again asked "do you have any good news for me!"  When Kenneth [name] is redirected, Kenneth [name] shouted at the writer "thanks for the 5 seconds you spent with me!" in a anger sarcastic way.   Otherwise, Kenneth [name] seems to a bit slightly more organized.   Principal Problem: Schizophrenia, undifferentiated ([place]) Diagnosis:   Patient Active Problem List   Diagnosis Date Noted  . Schizophrenia, undifferentiated ([place]) [F20.3] 03/16/2018  . Noncompliance [Z91.19] 03/16/2018  . Tobacco use disorder [F17.200] 03/16/2018   Total Time spent with patient: 20 minutes  Past Psychiatric History: schizophrenia  Past Medical History:  Past Medical History:  Diagnosis Date  . Bipolar 1 disorder ([place])   . Hypertension   . Paranoid schizophrenia ([place])    History reviewed. No pertinent surgical history. Family History: History reviewed. No pertinent family history. Family Psychiatric  History: unknown Social History:  Social History   Substance and Sexual Activity  Alcohol Use Not Currently     Social History   Substance and Sexual Activity  Drug Use Not on file    Social History   Socioeconomic History  . Marital status: Single    Spouse name: Not on file  . Number of  children: Not on file  . Years of education: Not on file  . Highest education level: Not on file  Occupational History  . Not on file  Social Needs  . Financial resource strain: Not on file  . Food insecurity:    Worry: Not on file    Inability: Not on file  . Transportation needs:    Medical: Not on file    Non-medical: Not on file  Tobacco Use  . Smoking status: Current Every Day Smoker    Types: Cigarettes  . Smokeless tobacco: Never Used  Substance and Sexual Activity  . Alcohol use: Not Currently  . Drug use: Not on file  . Sexual activity: Not on file  Lifestyle  . Physical activity:    Days per week: Not on file    Minutes per session: Not on file  . Stress: Not on file  Relationships  . Social connections:    Talks on phone: Not on file    Gets together: Not on file    Attends religious service: Not on file    Active member of club or organization: Not on file    Attends meetings of clubs or organizations: Not on file    Relationship status: Not on file  Other Topics Concern  . Not on file  Social History Narrative  . Not on file   Additional Social History:   Sleep: Fair  Appetite:  Fair  Current Medications: Current Facility-Administered Medications  Medication Dose Route Frequency Provider Last Rate Last Dose  . acetaminophen (TYLENOL) tablet 650 mg  650 mg Oral Q6H PRN Clapacs, John T, MD      . alum & mag hydroxide-simeth (MAALOX/MYLANTA) 200-200-20 MG/5ML suspension 30 mL  30 mL Oral Q4H PRN Clapacs, John T, MD      . divalproex (DEPAKOTE) DR tablet 500 mg  500 mg Oral TID Clapacs, John T, MD   500 mg at 03/19/18 1154  . hydrOXYzine (ATARAX/VISTARIL) tablet 50 mg  50 mg Oral TID PRN Clapacs, Madie Reno, MD   50 mg at 03/17/18 2201  . magnesium hydroxide (MILK OF MAGNESIA) suspension 30 mL  30 mL Oral Daily PRN Clapacs, John T, MD      . nicotine (NICODERM CQ - dosed in mg/24 hours) patch 21 mg  21 mg Transdermal Daily Pucilowska, Jolanta B, MD   21 mg at  03/17/18 0829  . OLANZapine (ZYPREXA) tablet 15 mg  15 mg Oral BID Clapacs, Madie Reno, MD   15 mg at 03/19/18 0755  . paliperidone (INVEGA SUSTENNA) injection 234 mg  234 mg Intramuscular Q28 days Pucilowska, Jolanta B, MD   234 mg at 03/18/18 2326  . risperiDONE (RISPERDAL) tablet 2 mg  2 mg Oral BID Clapacs, Madie Reno, MD   2 mg at 03/19/18 0755  . temazepam (RESTORIL) capsule 15 mg  15 mg Oral QHS Pucilowska, Jolanta B, MD   15 mg at 03/18/18 2132  . traZODone (DESYREL) tablet 100 mg  100 mg Oral QHS PRN Clapacs, Madie Reno, MD   100 mg at 03/16/18 2112    Lab Results:  No results found for this or any previous visit (from the past 48 hour(s)).  Blood Alcohol level:  Lab Results  Component Value Date   [facility] <10 03/15/2018   ETH <11 41/93/7902    Metabolic Disorder Labs: Lab Results  Component Value Date   HGBA1C 5.5 03/15/2018   MPG 111 03/15/2018   No results found for: PROLACTIN Lab Results  Component Value Date   CHOL 175 03/15/2018   TRIG 118 03/15/2018   HDL 35 (L) 03/15/2018   CHOLHDL 5.0 03/15/2018   VLDL 24 03/15/2018   LDLCALC 116 (H) 03/15/2018   LDLCALC 74 02/23/2012    Physical Findings: AIMS: Facial and Oral Movements Muscles of Facial Expression: None, normal Lips and Perioral Area: None, normal Jaw: None, normal Tongue: None, normal,Extremity Movements Upper (arms, wrists, hands, fingers): None, normal Lower (legs, knees, ankles, toes): None, normal, Trunk Movements Neck, shoulders, hips: None, normal, Overall Severity Severity of abnormal movements (highest score from questions above): None, normal Incapacitation due to abnormal movements: None, normal Patient's awareness of abnormal movements (rate only patient's report): No Awareness, Dental Status Current problems with teeth and/or dentures?: Yes Does patient usually wear dentures?: No  CIWA:    COWS:     Musculoskeletal: Strength & Muscle Tone: within normal limits Gait & Station: normal Patient  leans: N/A  Psychiatric Specialty Exam: Physical Exam  Nursing note and vitals reviewed. Psychiatric: His affect is blunt and inappropriate. Kenneth [name] is slowed, withdrawn and actively hallucinating. Thought content is delusional. Kenneth [name] expresses inappropriate judgment. Kenneth [name] is noncommunicative. Kenneth [name] exhibits abnormal recent memory and abnormal remote memory.    Review of Systems  Neurological: Negative.   Psychiatric/Behavioral: Positive for hallucinations and memory loss.  All other systems reviewed and are negative.   Blood pressure 128/84, pulse 100, temperature 98.6 F (37  C), temperature source Oral, resp. rate 16, height 5\' 5"  (1.651 m), weight 76.7 kg, SpO2 97 %.Body mass index is 28.12 kg/m.  General Appearance: Disheveled  Eye Contact:  Fair  Speech:  Clear and Coherent  Volume:  Increased  Mood:  Irritable  Affect:  Congruent, Inappropriate and Labile  Thought Process:  Disorganized, Irrelevant and Descriptions of Associations: Loose  Orientation:  Full (Time, Place, and Person)  Thought Content:  Paranoid Ideation and Rumination  Suicidal Thoughts:  No  Homicidal Thoughts:  No  Memory:  Immediate;   Poor Recent;   Poor Remote;   Poor  Judgement:  Poor  Insight:  Lacking  Psychomotor Activity:  Restlessness  Concentration:  Concentration: Poor and Attention Span: Poor  Recall:  Poor  Fund of Knowledge:  Poor  Language:  Poor  Akathisia:  No  Handed:  Right  AIMS (if indicated):     Assets:  Communication Skills Desire for Improvement Financial Resources/Insurance Physical Health Resilience  ADL's:  Impaired  Cognition:  Impaired,  Severe  Sleep:  Number of Hours: 6     Treatment Plan Summary: Daily contact with patient to assess and evaluate symptoms and progress in treatment and Medication management   Mr. Nusz is a 5 year oldmale with a history of schizophrenia admitted for psychotic break in the context of treatment noncompliance.  #Psychosis,  improving -we continued outpatient regimen -Zyprexa 15 mg BID -Risperdal 2 mg BID (plan to stop in 2-3 week after [place] take into effect).  Lorayne Bender sustenna 234 mg injection, initiated here, and was given on 10/4 -Depakote 500 mg TID as mood stabilizer.   #Insomnia, sleep poorly  -Trazodone 100 mg nightly PRN - continue Restoril 15 mg nightly, consider stop this med after his manic and psychotic Sx is controlled.   #Weight loss -double portions  #Smoking cessation -nicotine patch is available  #Labs  -lipid panel, TSH, A1C are normal -EKG reviewed, sinus tachycardia with QTc 457  #Disposition -TBE -unclear if discharge to his group home.  Pt requested to stay in [place], not to go back to [place].  -follow up with his regular provider   Reneshia Zuccaro, MD 03/19/2018, 5:05 PM

## 2018-03-19 NOTE — Plan of Care (Signed)
Patient is alert and oriented to himself. Confusion noted, patient paced the unit today preoccupied with being discharged and wanted his belongings back so that he can leave. Compliant with medications and meals, denies wanting to harm himself or anyone else at this time. [place] remains safe with q 15 minute safety checks. Will continue to monitor.

## 2018-03-19 NOTE — [facility] Group Notes (Signed)
LCSW Group Therapy Note   03/19/2018 1:15pm   Type of Therapy and Topic:  Group Therapy:  Trust and Honesty  Participation Level:  Did Not Attend  Description of Group:    In this group patients will be asked to explore the value of being honest.  Patients will be guided to discuss their thoughts, feelings, and behaviors related to honesty and trusting in others. Patients will process together how trust and honesty relate to forming relationships with peers, family members, and self. Each patient will be challenged to identify and express feelings of being vulnerable. Patients will discuss reasons why people are dishonest and identify alternative outcomes if one was truthful (to self or others). This group will be process-oriented, with patients participating in exploration of their own experiences, giving and receiving support, and processing challenge from other group members.   Therapeutic Goals: 1. Patient will identify why honesty is important to relationships and how honesty overall affects relationships.  2. Patient will identify a situation where they lied or were lied too and the  feelings, thought process, and behaviors surrounding the situation 3. Patient will identify the meaning of being vulnerable, how that feels, and how that correlates to being honest with self and others. 4. Patient will identify situations where they could have told the truth, but instead lied and explain reasons of dishonesty.   Summary of Patient Progress: Pt was invited to attend group but chose not to attend. CSW will continue to encourage pt to attend group throughout their admission.     Therapeutic Modalities:   Cognitive Behavioral Therapy Solution Focused Therapy Motivational Interviewing Brief Therapy  Sayde Lish  CUEBAS-COLON, LCSW 03/19/2018 11:57 AM

## 2018-03-20 MED ORDER — PALIPERIDONE PALMITATE ER 156 MG/ML IM SUSY
156.0000 mg | PREFILLED_SYRINGE | Freq: Once | INTRAMUSCULAR | Status: AC
  Administered 2018-03-22: 156 mg via INTRAMUSCULAR
  Filled 2018-03-20 (×2): qty 1

## 2018-03-20 MED ORDER — TEMAZEPAM 15 MG PO CAPS
15.0000 mg | ORAL_CAPSULE | Freq: Every evening | ORAL | Status: DC | PRN
  Administered 2018-03-20 – 2018-03-21 (×2): 15 mg via ORAL
  Filled 2018-03-20 (×2): qty 1

## 2018-03-20 MED ORDER — TRAZODONE HCL 100 MG PO TABS
100.0000 mg | ORAL_TABLET | Freq: Every day | ORAL | Status: DC
  Administered 2018-03-20 – 2018-03-22 (×3): 100 mg via ORAL
  Filled 2018-03-20 (×3): qty 1

## 2018-03-20 NOTE — [facility] Group Notes (Signed)
LCSW Group Therapy Note 03/20/2018 1:15pm  Type of Therapy and Topic: Group Therapy: Feelings Around Returning Home & Establishing a Supportive Framework and Supporting Oneself When Supports Not Available  Participation Level: Did Not Attend  Description of Group:  Patients first processed thoughts and feelings about upcoming discharge. These included fears of upcoming changes, lack of change, new living environments, judgements and expectations from others and overall stigma of mental health issues. The group then discussed the definition of a supportive framework, what that looks and feels like, and how do to discern it from an unhealthy non-supportive network. The group identified different types of supports as well as what to do when your family/friends are less than helpful or unavailable  Therapeutic Goals  1. Patient will identify one healthy supportive network that they can use at discharge. 2. Patient will identify one factor of a supportive framework and how to tell it from an unhealthy network. 3. Patient able to identify one coping skill to use when they do not have positive supports from others. 4. Patient will demonstrate ability to communicate their needs through discussion and/or role plays.  Summary of Patient Progress:  Pt was invited to attend group but chose not to attend. CSW will continue to encourage pt to attend group throughout their admission.   Therapeutic Modalities Cognitive Behavioral Therapy Motivational Interviewing   Janett Kamath  CUEBAS-COLON, LCSW 03/20/2018 11:35 AM

## 2018-03-20 NOTE — Plan of Care (Signed)
Patient is constantly redirect and oriented to place and time, patient appear cognitively challenged and needs constant updates and meal times,patient is compliant with medication regimen and present  in group activities, patient voice no concerns st this time, sleep long hours at a time without any interruptions. No noticeable side effect from medication, thought process is poor, no distress , denies any SI/HI and no signs of AVH. Problem: Education: Goal: Utilization of techniques to improve thought processes will improve Outcome: Progressing Goal: Knowledge of the prescribed therapeutic regimen will improve Outcome: Progressing   Problem: Activity: Goal: Interest or engagement in leisure activities will improve Outcome: Progressing Goal: Imbalance in normal sleep/wake cycle will improve Outcome: Progressing   Problem: Coping: Goal: Coping ability will improve Outcome: Progressing Goal: Will verbalize feelings Outcome: Progressing   Problem: Health Behavior/Discharge Planning: Goal: Ability to make decisions will improve Outcome: Progressing Goal: Compliance with therapeutic regimen will improve Outcome: Progressing   Problem: Role Relationship: Goal: Will demonstrate positive changes in social behaviors and relationships Outcome: Progressing   Problem: Self-Concept: Goal: Will verbalize positive feelings about self Outcome: Progressing Goal: Level of anxiety will decrease Outcome: Progressing

## 2018-03-20 NOTE — Progress Notes (Signed)
[facility] MD Progress Note  03/20/2018 2:30 PM Kenneth [name].  MRN:  [id number]  Subjective:   Kenneth [name] has a long history of schizophrenia. Dr[facility] Mamie Nick [facility] [name] walked away from a group home in [place] to arrive in [place]. Very disorganized.  [name] is calm today, but still disorganized. [name] was worried about his belongings in our storage, stated that "the bag was open! But I am sure I tied it up!"  [name] then talks something about Death Truth of Love and stated "if you spell Love backward, it is 'evol', and that is evil!" then [name] laughed.  Overall, still disorganized.   No SI or HI.   Principal Problem: Schizophrenia, undifferentiated ([place]) Diagnosis:   Patient Active Problem List   Diagnosis Date Noted  . Schizophrenia, undifferentiated ([place]) [F20.3] 03/16/2018  . Noncompliance [Z91.19] 03/16/2018  . Tobacco use disorder [F17.200] 03/16/2018   Total Time spent with patient: 20 minutes  Past Psychiatric History: schizophrenia  Past Medical History:  Past Medical History:  Diagnosis Date  . Bipolar 1 disorder ([place])   . Hypertension   . Paranoid schizophrenia ([place])    History reviewed. No pertinent surgical history. Family History: History reviewed. No pertinent family history. Family Psychiatric  History: unknown Social History:  Social History   Substance and Sexual Activity  Alcohol Use Not Currently     Social History   Substance and Sexual Activity  Drug Use Not on file    Social History   Socioeconomic History  . Marital status: Single    Spouse name: Not on file  . Number of children: Not on file  . Years of education: Not on file  . Highest education level: Not on file  Occupational History  . Not on file  Social Needs  . Financial resource strain: Not on file  . Food insecurity:    Worry: Not on file    Inability: Not on file  . Transportation needs:    Medical: Not on file    Non-medical: Not on file   Tobacco Use  . Smoking status: Current Every Day Smoker    Types: Cigarettes  . Smokeless tobacco: Never Used  Substance and Sexual Activity  . Alcohol use: Not Currently  . Drug use: Not on file  . Sexual activity: Not on file  Lifestyle  . Physical activity:    Days per week: Not on file    Minutes per session: Not on file  . Stress: Not on file  Relationships  . Social connections:    Talks on phone: Not on file    Gets together: Not on file    Attends religious service: Not on file    Active member of club or organization: Not on file    Attends meetings of clubs or organizations: Not on file    Relationship status: Not on file  Other Topics Concern  . Not on file  Social History Narrative  . Not on file   Additional Social History:   Sleep: Fair  Appetite:  Fair  Current Medications: Current Facility-Administered Medications  Medication Dose Route Frequency Provider Last Rate Last Dose  . acetaminophen (TYLENOL) tablet 650 mg  650 mg Oral Q6H PRN Clapacs, John T, MD      . alum & mag hydroxide-simeth (MAALOX/MYLANTA) 200-200-20 MG/5ML suspension 30 mL  30 mL Oral Q4H PRN Clapacs, Madie Reno, MD      . divalproex (DEPAKOTE) DR  tablet 500 mg  500 mg Oral TID Clapacs, John T, MD   500 mg at 03/20/18 1120  . hydrOXYzine (ATARAX/VISTARIL) tablet 50 mg  50 mg Oral TID PRN Clapacs, Madie Reno, MD   50 mg at 03/17/18 2201  . magnesium hydroxide (MILK OF MAGNESIA) suspension 30 mL  30 mL Oral Daily PRN Clapacs, John T, MD      . nicotine (NICODERM CQ - dosed in mg/24 hours) patch 21 mg  21 mg Transdermal Daily Pucilowska, Jolanta B, MD   21 mg at 03/17/18 0829  . OLANZapine (ZYPREXA) tablet 15 mg  15 mg Oral BID Clapacs, Madie Reno, MD   15 mg at 03/20/18 0735  . paliperidone (INVEGA SUSTENNA) injection 234 mg  234 mg Intramuscular Q28 days Pucilowska, Jolanta B, MD   234 mg at 03/18/18 2326  . risperiDONE (RISPERDAL) tablet 2 mg  2 mg Oral BID Clapacs, Madie Reno, MD   2 mg at 03/20/18 0735   . temazepam (RESTORIL) capsule 15 mg  15 mg Oral QHS PRN Kenya Shiraishi, MD      . traZODone (DESYREL) tablet 100 mg  100 mg Oral QHS Erin Obando, MD        Lab Results:  No results found for this or any previous visit (from the past 48 hour(s)).  Blood Alcohol level:  Lab Results  Component Value Date   [facility] <10 03/15/2018   ETH <11 23/55/7322    Metabolic Disorder Labs: Lab Results  Component Value Date   HGBA1C 5.5 03/15/2018   MPG 111 03/15/2018   No results found for: PROLACTIN Lab Results  Component Value Date   CHOL 175 03/15/2018   TRIG 118 03/15/2018   HDL 35 (L) 03/15/2018   CHOLHDL 5.0 03/15/2018   VLDL 24 03/15/2018   LDLCALC 116 (H) 03/15/2018   LDLCALC 74 02/23/2012    Physical Findings: AIMS: Facial and Oral Movements Muscles of Facial Expression: None, normal Lips and Perioral Area: None, normal Jaw: None, normal Tongue: None, normal,Extremity Movements Upper (arms, wrists, hands, fingers): None, normal Lower (legs, knees, ankles, toes): None, normal, Trunk Movements Neck, shoulders, hips: None, normal, Overall Severity Severity of abnormal movements (highest score from questions above): None, normal Incapacitation due to abnormal movements: None, normal Patient's awareness of abnormal movements (rate only patient's report): No Awareness, Dental Status Current problems with teeth and/or dentures?: Yes Does patient usually wear dentures?: No  CIWA:    COWS:     Musculoskeletal: Strength & Muscle Tone: within normal limits Gait & Station: normal Patient leans: N/A  Psychiatric Specialty Exam: Physical Exam  Nursing note and vitals reviewed. Psychiatric: His affect is blunt and inappropriate. Cincere Zorn is slowed, withdrawn and actively hallucinating. Thought content is delusional. Dashawn Bartnick expresses inappropriate judgment. Jonathan Kirkendoll is noncommunicative. Sharri Loya exhibits abnormal recent memory and abnormal remote memory.    Review of Systems  Neurological: Negative.    Psychiatric/Behavioral: Positive for hallucinations and memory loss.  All other systems reviewed and are negative.   Blood pressure 125/81, pulse 94, temperature 98.6 F (37 C), temperature source Oral, resp. rate 16, height 5\' 5"  (1.651 m), weight 76.7 kg, SpO2 96 %.Body mass index is 28.12 kg/m.  General Appearance: Disheveled  Eye Contact:  Fair  Speech:  Clear and Coherent  Volume:  Increased  Mood:  Irritable  Affect:  Congruent, Inappropriate and Labile  Thought Process:  Disorganized, Irrelevant and Descriptions of Associations: Loose  Orientation:  Full (Time, Place, and Person)  Thought Content:  Paranoid Ideation and Rumination  Suicidal Thoughts:  No  Homicidal Thoughts:  No  Memory:  Immediate;   Poor Recent;   Poor Remote;   Poor  Judgement:  Poor  Insight:  Lacking  Psychomotor Activity:  Restlessness  Concentration:  Concentration: Poor and Attention Span: Poor  Recall:  Poor  Fund of Knowledge:  Poor  Language:  Poor  Akathisia:  No  Handed:  Right  AIMS (if indicated):     Assets:  Communication Skills Desire for Improvement Financial Resources/Insurance Physical Health Resilience  ADL's:  Impaired  Cognition:  Impaired,  Severe  Sleep:  Number of Hours: 7.15     Treatment Plan Summary: Daily contact with patient to assess and evaluate symptoms and progress in treatment and Medication management   Mr. Hobbins is a 75 year oldmale with a history of schizophrenia admitted for psychotic break in the context of treatment noncompliance.  #Psychosis, improving -we continued outpatient regimen -Zyprexa 15 mg BID -Risperdal 2 mg BID (plan to stop in 2-3 week after [place] take into effect).  Lorayne Bender sustenna 234 mg injection, initiated here, and was given on 10/4 -Depakote 500 mg TID as mood stabilizer.   #Insomnia, sleep poorly  -Trazodone 100 mg nightly PRN - continue Restoril 15 mg nightly, consider stop this med after his manic and  psychotic Sx is controlled.   #Weight loss -double portions  #Smoking cessation -nicotine patch is available  #Labs  -lipid panel, TSH, A1C are normal -EKG reviewed, sinus tachycardia with QTc 457  #Disposition -TBE -unclear if discharge to his group home.  Pt requested to stay in [place], not to go back to [place].  -follow up with his regular provider   Lowell Makara, MD 03/20/2018, 2:30 PM

## 2018-03-20 NOTE — Plan of Care (Signed)
Patient up ad lib ambulating  the unit with steady gait. In room majority of the day lying in bed resting  Some confusion noted when communicating with patient. Patient is preoccupied in getting his items and leaving. This writer explained to patient multiple times of the discharge process. Compliant with medications and meals. Affect flat and sad, denies having any thoughts of self harm, HI and pain. Will continue to monitor.

## 2018-03-20 NOTE — Progress Notes (Addendum)
[facility] MD Progress Note  03/21/2018 4:37 PM Kenneth [name].  MRN:  [id number]  Subjective:   Kenneth [name] improved a lot. He is pleasant and chatty. He takes medications including invega suatenna injection, he is ready to return to his group home which he refused last week. He tells me that he wondered off his group home and got lost. He was waiting for someone too long and lost hope that the staff member will ever return. He lives with a room mate that he likes and likes the food there.   Principal Problem: Schizophrenia, undifferentiated ([place]) Diagnosis:   Patient Active Problem List   Diagnosis Date Noted  . Schizophrenia, undifferentiated ([place]) [F20.3] 03/16/2018    Priority: High  . Noncompliance [Z91.19] 03/16/2018  . Tobacco use disorder [F17.200] 03/16/2018   Total Time spent with patient: 20 minutes  Past Psychiatric History: schizophrenia  Past Medical History:  Past Medical History:  Diagnosis Date  . Bipolar 1 disorder ([place])   . Hypertension   . Paranoid schizophrenia ([place])    History reviewed. No pertinent surgical history. Family History: History reviewed. No pertinent family history. Family Psychiatric  History: unknown Social History:  Social History   Substance and Sexual Activity  Alcohol Use Not Currently     Social History   Substance and Sexual Activity  Drug Use Not on file    Social History   Socioeconomic History  . Marital status: Single    Spouse name: Not on file  . Number of children: Not on file  . Years of education: Not on file  . Highest education level: Not on file  Occupational History  . Not on file  Social Needs  . Financial resource strain: Not on file  . Food insecurity:    Worry: Not on file    Inability: Not on file  . Transportation needs:    Medical: Not on file    Non-medical: Not on file  Tobacco Use  . Smoking status: Current Every Day Smoker    Types: Cigarettes  . Smokeless tobacco: Never Used  Substance and  Sexual Activity  . Alcohol use: Not Currently  . Drug use: Not on file  . Sexual activity: Not on file  Lifestyle  . Physical activity:    Days per week: Not on file    Minutes per session: Not on file  . Stress: Not on file  Relationships  . Social connections:    Talks on phone: Not on file    Gets together: Not on file    Attends religious service: Not on file    Active member of club or organization: Not on file    Attends meetings of clubs or organizations: Not on file    Relationship status: Not on file  Other Topics Concern  . Not on file  Social History Narrative  . Not on file   Additional Social History:                         Sleep: Fair  Appetite:  Good  Current Medications: Current Facility-Administered Medications  Medication Dose Route Frequency Provider Last Rate Last Dose  . acetaminophen (TYLENOL) tablet 650 mg  650 mg Oral Q6H PRN Clapacs, John T, MD      . alum & mag hydroxide-simeth (MAALOX/MYLANTA) 200-200-20 MG/5ML suspension 30 mL  30 mL Oral Q4H PRN Clapacs, John T, MD      . divalproex (DEPAKOTE) DR tablet  500 mg  500 mg Oral TID Clapacs, John T, MD   500 mg at 03/21/18 1611  . hydrOXYzine (ATARAX/VISTARIL) tablet 50 mg  50 mg Oral TID PRN Clapacs, Madie Reno, MD   50 mg at 03/17/18 2201  . magnesium hydroxide (MILK OF MAGNESIA) suspension 30 mL  30 mL Oral Daily PRN Clapacs, John T, MD      . nicotine (NICODERM CQ - dosed in mg/24 hours) patch 21 mg  21 mg Transdermal Daily Banner Huckaba B, MD   21 mg at 03/17/18 0829  . OLANZapine (ZYPREXA) tablet 15 mg  15 mg Oral BID Clapacs, Madie Reno, MD   15 mg at 03/21/18 1611  . [START ON 03/22/2018] paliperidone (INVEGA SUSTENNA) injection 156 mg  156 mg Intramuscular Once Geraline Halberstadt B, MD      . paliperidone (INVEGA SUSTENNA) injection 234 mg  234 mg Intramuscular Q28 days Sibley Rolison B, MD   234 mg at 03/18/18 2326  . risperiDONE (RISPERDAL) tablet 2 mg  2 mg Oral BID Clapacs,  Madie Reno, MD   2 mg at 03/21/18 1611  . temazepam (RESTORIL) capsule 15 mg  15 mg Oral QHS PRN He, Jun, MD   15 mg at 03/20/18 2049  . traZODone (DESYREL) tablet 100 mg  100 mg Oral QHS He, Jun, MD   100 mg at 03/20/18 2049    Lab Results: No results found for this or any previous visit (from the past 48 hour(s)).  Blood Alcohol level:  Lab Results  Component Value Date   [facility] <10 03/15/2018   ETH <11 50/53/9767    Metabolic Disorder Labs: Lab Results  Component Value Date   HGBA1C 5.5 03/15/2018   MPG 111 03/15/2018   No results found for: PROLACTIN Lab Results  Component Value Date   CHOL 175 03/15/2018   TRIG 118 03/15/2018   HDL 35 (L) 03/15/2018   CHOLHDL 5.0 03/15/2018   VLDL 24 03/15/2018   LDLCALC 116 (H) 03/15/2018   LDLCALC 74 02/23/2012    Physical Findings: AIMS: Facial and Oral Movements Muscles of Facial Expression: None, normal Lips and Perioral Area: None, normal Jaw: None, normal Tongue: None, normal,Extremity Movements Upper (arms, wrists, hands, fingers): None, normal Lower (legs, knees, ankles, toes): None, normal, Trunk Movements Neck, shoulders, hips: None, normal, Overall Severity Severity of abnormal movements (highest score from questions above): None, normal Incapacitation due to abnormal movements: None, normal Patient's awareness of abnormal movements (rate only patient's report): No Awareness, Dental Status Current problems with teeth and/or dentures?: Yes Does patient usually wear dentures?: No  CIWA:    COWS:     Musculoskeletal: Strength & Muscle Tone: within normal limits Gait & Station: normal Patient leans: N/A  Psychiatric Specialty Exam: Physical Exam  Nursing note and vitals reviewed. Psychiatric: His affect is labile and inappropriate. His speech is rapid and/or pressured. He is withdrawn and actively hallucinating. Thought content is paranoid and delusional. Cognition and memory are impaired. He expresses impulsivity and  inappropriate judgment.    Review of Systems  Neurological: Negative.   Psychiatric/Behavioral: Positive for hallucinations.  All other systems reviewed and are negative.   Blood pressure 125/81, pulse 94, temperature 98.6 F (37 C), temperature source Oral, resp. rate 16, height 5\' 5"  (1.651 m), weight 76.7 kg, SpO2 96 %.Body mass index is 28.12 kg/m.  General Appearance: Disheveled  Eye Contact:  Good  Speech:  Pressured  Volume:  Normal  Mood:  Dysphoric and Irritable  Affect:  Inappropriate  Thought Process:  Disorganized, Irrelevant and Descriptions of Associations: Loose  Orientation:  Full (Time, Place, and Person)  Thought Content:  Illogical, Delusions and Paranoid Ideation  Suicidal Thoughts:  No  Homicidal Thoughts:  No  Memory:  Immediate;   Poor Recent;   Poor Remote;   Poor  Judgement:  Poor  Insight:  Lacking  Psychomotor Activity:  Increased  Concentration:  Concentration: Poor and Attention Span: Poor  Recall:  Poor  Fund of Knowledge:  Poor  Language:  Poor  Akathisia:  No  Handed:  Right  AIMS (if indicated):     Assets:  Communication Skills Desire for Improvement Financial Resources/Insurance Physical Health Resilience  ADL's:  Intact  Cognition:  WNL  Sleep:  Number of Hours: 5.45     Treatment Plan Summary: Daily contact with patient to assess and evaluate symptoms and progress in treatment and Medication management   Kenneth [name] is a 57 year old male with a history of schizophrenia admitted for psychotic break in the context of treatment noncompliance.  #Psychosis, improving -we continued outpatient regimen -Zyprexa 15 mg BID -Risperdal 2 mg BID (plan to stop in 2-3 week after [place] take into effect).  Lorayne Bender sustenna 234 mg injection, initiated here, and was given on 10/4, next injection on 10/10 -Depakote 500 mg TID as mood stabilizer.   #Insomnia, sleep poorly  -Trazodone 100 mg nightly PRN -change Restoril to PRN    #Weight loss -double portions  #Smoking cessation -nicotine patch is available  #Labs  -lipid panel, TSH, A1C are normal -EKG reviewed, sinus tachycardia with QTc 457  #Disposition -TBE -unclear if discharge to his group home.  Pt requested to stay in [place], not to go back to [place].  -follow up with his regular provider   Orson Slick, MD 03/21/2018, 4:37 PM

## 2018-03-20 NOTE — [facility] Group Notes (Signed)
[facility] Group Notes:  (Nursing/MHT/Case Management/Adjunct)  Date:  03/20/2018  Time:  10:03 PM  Type of Therapy:  Group Therapy  Participation Level:  Did Not Attend    Barnie Mort 03/20/2018, 10:03 PM

## 2018-03-21 NOTE — Progress Notes (Signed)
Recreation Therapy Notes  Date: 03/21/2018  Time: 9:30 am   Location: Craft room   Behavioral response: N/A   Intervention Topic: Self-esteem  Discussion/Intervention: Patient did not attend group.   Clinical Observations/Feedback:  Patient did not attend group.   Lisabeth Mian LRT/CTRS        Marks Scalera 03/21/2018 11:32 AM

## 2018-03-21 NOTE — Progress Notes (Signed)
Patient ID: Kenneth [name]., male   DOB: [birth year], 57 y.o.   MRN: [id number] DAR Note: Pt continue to be confused, disorganized and preoccupied about a phone number in his locker. Pt at assessment endorsed moderate anxiety and so worrying, "I just need those number in my locker."  Pt denied depression, pain, SI/HI or AVA. All Pt's questions and concerns addressed. Pt does not look to be in any acute distress. Support, encouragement, and safe environment provided. Pt was med compliant. 15-minute safety checks continue.

## 2018-03-21 NOTE — [facility] Group Notes (Signed)
[facility] Group Notes:  (Nursing/MHT/Case Management/Adjunct)  Date:  03/21/2018  Time:  10:39 PM  Type of Therapy:  Group Therapy  Participation Level:  Did Not Attend    Barnie Mort 03/21/2018, 10:39 PM

## 2018-03-21 NOTE — [facility] Suicide Risk Assessment (Signed)
[facility] INPATIENT:  Family/Significant Other Suicide Prevention Education  Suicide Prevention Education:  Education Completed; Kathie Dike, [facility] [employer], [phone] has been identified by the patient as the family member/significant other with whom the patient will be residing, and identified as the person(s) who will aid the patient in the event of a mental health crisis (suicidal ideations/suicide attempt).  With written consent from the patient, the family member/significant other has been provided the following suicide prevention education, prior to the and/or following the discharge of the patient.  The suicide prevention education provided includes the following:  Suicide risk factors  Suicide prevention and interventions  National Suicide Hotline telephone number  [facility] assessment telephone number  [facility] Emergency Assistance [place] and/or Residential Mobile Crisis Unit telephone number  Request made of family/significant other to:  Remove weapons (e.g., guns, rifles, knives), all items previously/currently identified as safety concern.    Remove drugs/medications (over-the-counter, prescriptions, illicit drugs), all items previously/currently identified as a safety concern.  The family member/significant other verbalizes understanding of the suicide prevention education information provided.  The family member/significant other agrees to remove the items of safety concern listed above.   CSW spoke with Margette Fast from [place] at [place], [phone]. She reports that he walked off they did not know where he went to. She filed a missing person report, checked the hospitals and jails. She reports that every year when they have their state inspection, he gets triggered due to his status as a sex offender. She reports that he has been great with them and has been living there for longer than 4 years. She reports  that he wants him to be stabilized. She reports that he gets his medications from Dr. Lavera Guise in [place].  She reports that they will be able to come pick him up discharge depending on the time. No other concerns were reported.     Darin Engels 03/21/2018, 3:21 PM

## 2018-03-21 NOTE — Progress Notes (Signed)
[facility] MD Progress Note  03/22/2018 1:50 PM Kenneth [name].  MRN:  [id number]  Subjective:    Kenneth [name] still psychotic. He took a shower fully clothed including a heavy jacket. He is paranoid and conspiratorial. He wants to "talk to me some more" and makes strange gestures around his head. He is veery pleased that he is allowed to return to his group home. He has been there for 6 years without any trouble, avoiding hospitalizations. Apparently, the group home had some state inspection on the day Thane escaped. He has always been nervous around [employer]" as he is a registered sex offender. Group home believes that it was the precipitating factor.   Anticipated discharge tomorrow after second injection of Invega sustenna.  Principal Problem: Schizophrenia, undifferentiated ([place]) Diagnosis:   Patient Active Problem List   Diagnosis Date Noted  . Schizophrenia, undifferentiated ([place]) [F20.3] 03/16/2018    Priority: High  . Noncompliance [Z91.19] 03/16/2018  . Tobacco use disorder [F17.200] 03/16/2018   Total Time spent with patient: 20 minutes  Past Psychiatric History: schizophrenia  Past Medical History:  Past Medical History:  Diagnosis Date  . Bipolar 1 disorder ([place])   . Hypertension   . Paranoid schizophrenia ([place])    History reviewed. No pertinent surgical history. Family History: History reviewed. No pertinent family history. Family Psychiatric  History: unknown Social History:  Social History   Substance and Sexual Activity  Alcohol Use Not Currently     Social History   Substance and Sexual Activity  Drug Use Not on file    Social History   Socioeconomic History  . Marital status: Single    Spouse name: Not on file  . Number of children: Not on file  . Years of education: Not on file  . Highest education level: Not on file  Occupational History  . Not on file  Social Needs  . Financial resource strain: Not on file  . Food insecurity:    Worry: Not  on file    Inability: Not on file  . Transportation needs:    Medical: Not on file    Non-medical: Not on file  Tobacco Use  . Smoking status: Current Every Day Smoker    Types: Cigarettes  . Smokeless tobacco: Never Used  Substance and Sexual Activity  . Alcohol use: Not Currently  . Drug use: Not on file  . Sexual activity: Not on file  Lifestyle  . Physical activity:    Days per week: Not on file    Minutes per session: Not on file  . Stress: Not on file  Relationships  . Social connections:    Talks on phone: Not on file    Gets together: Not on file    Attends religious service: Not on file    Active member of club or organization: Not on file    Attends meetings of clubs or organizations: Not on file    Relationship status: Not on file  Other Topics Concern  . Not on file  Social History Narrative  . Not on file   Additional Social History:                         Sleep: Fair  Appetite:  Fair  Current Medications: Current Facility-Administered Medications  Medication Dose Route Frequency Provider Last Rate Last Dose  . acetaminophen (TYLENOL) tablet 650 mg  650 mg Oral Q6H PRN Clapacs, Madie Reno, MD      .  alum & mag hydroxide-simeth (MAALOX/MYLANTA) 200-200-20 MG/5ML suspension 30 mL  30 mL Oral Q4H PRN Clapacs, John T, MD      . divalproex (DEPAKOTE) DR tablet 500 mg  500 mg Oral TID Clapacs, John T, MD   500 mg at 03/22/18 1221  . hydrOXYzine (ATARAX/VISTARIL) tablet 50 mg  50 mg Oral TID PRN Clapacs, Madie Reno, MD   50 mg at 03/17/18 2201  . magnesium hydroxide (MILK OF MAGNESIA) suspension 30 mL  30 mL Oral Daily PRN Clapacs, John T, MD      . nicotine (NICODERM CQ - dosed in mg/24 hours) patch 21 mg  21 mg Transdermal Daily Iosefa Weintraub B, MD   21 mg at 03/22/18 0829  . OLANZapine (ZYPREXA) tablet 15 mg  15 mg Oral BID Clapacs, Madie Reno, MD   15 mg at 03/22/18 0829  . paliperidone (INVEGA SUSTENNA) injection 234 mg  234 mg Intramuscular Q28 days  Etienne Mowers B, MD   234 mg at 03/18/18 2326  . risperiDONE (RISPERDAL) tablet 2 mg  2 mg Oral BID Clapacs, Madie Reno, MD   2 mg at 03/22/18 0829  . temazepam (RESTORIL) capsule 15 mg  15 mg Oral QHS PRN He, Jun, MD   15 mg at 03/21/18 2118  . traZODone (DESYREL) tablet 100 mg  100 mg Oral QHS He, Jun, MD   100 mg at 03/21/18 2118    Lab Results: No results found for this or any previous visit (from the past 60 hour(s)).  Blood Alcohol level:  Lab Results  Component Value Date   [facility] <10 03/15/2018   ETH <11 29/56/2130    Metabolic Disorder Labs: Lab Results  Component Value Date   HGBA1C 5.5 03/15/2018   MPG 111 03/15/2018   No results found for: PROLACTIN Lab Results  Component Value Date   CHOL 175 03/15/2018   TRIG 118 03/15/2018   HDL 35 (L) 03/15/2018   CHOLHDL 5.0 03/15/2018   VLDL 24 03/15/2018   LDLCALC 116 (H) 03/15/2018   LDLCALC 74 02/23/2012    Physical Findings: AIMS: Facial and Oral Movements Muscles of Facial Expression: None, normal Lips and Perioral Area: None, normal Jaw: None, normal Tongue: None, normal,Extremity Movements Upper (arms, wrists, hands, fingers): None, normal Lower (legs, knees, ankles, toes): None, normal, Trunk Movements Neck, shoulders, hips: None, normal, Overall Severity Severity of abnormal movements (highest score from questions above): None, normal Incapacitation due to abnormal movements: None, normal Patient's awareness of abnormal movements (rate only patient's report): No Awareness, Dental Status Current problems with teeth and/or dentures?: Yes Does patient usually wear dentures?: No  CIWA:    COWS:     Musculoskeletal: Strength & Muscle Tone: within normal limits Gait & Station: normal Patient leans: N/A  Psychiatric Specialty Exam: Physical Exam  Nursing note and vitals reviewed. Psychiatric: His affect is blunt. His speech is slurred. He is slowed and withdrawn. Thought content is paranoid and delusional.  Cognition and memory are impaired. He expresses impulsivity.    Review of Systems  Neurological: Negative.   Psychiatric/Behavioral: Negative.   All other systems reviewed and are negative.   Blood pressure 106/77, pulse (!) 120, temperature 98 F (36.7 C), temperature source Oral, resp. rate 14, height 5\' 5"  (1.651 m), weight 76.7 kg, SpO2 98 %.Body mass index is 28.12 kg/m.  General Appearance: Disheveled  Eye Contact:  Good  Speech:  Slurred  Volume:  Decreased  Mood:  Anxious  Affect:  Flat  Thought  Process:  Disorganized and Irrelevant  Orientation:  Full (Time, Place, and Person)  Thought Content:  Delusions and Paranoid Ideation  Suicidal Thoughts:  No  Homicidal Thoughts:  No  Memory:  Immediate;   Poor Recent;   Poor Remote;   Poor  Judgement:  Poor  Insight:  Lacking  Psychomotor Activity:  Normal  Concentration:  Concentration: Poor and Attention Span: Poor  Recall:  Poor  Fund of Knowledge:  Poor  Language:  Poor  Akathisia:  No  Handed:  Right  AIMS (if indicated):     Assets:  Communication Skills Desire for Improvement Financial Resources/Insurance Housing Physical Health Resilience  ADL's:  Intact  Cognition:  WNL  Sleep:  Number of Hours: 7     Treatment Plan Summary: Daily contact with patient to assess and evaluate symptoms and progress in treatment and Medication management   Mr. Uzzle is a 57 year old male with a history of schizophrenia admitted for psychotic break in the context of treatment noncompliance.  #Psychosis, improving -we continued outpatient regimen -Zyprexa 15 mg BID -Risperdal 2 mg BID (plan to stop in 2-3 week after [place] take into effect).  Lorayne Bender sustenna 234 mg injection, initiated here, and was given on 10/4, next injection on 10/10 -Depakote 500 mg TID as mood stabilizer, VPA level in am   #Insomnia, sleep poorly  -Trazodone 100 mg nightly -discontinue Restoril   #Weight loss -double  portions  #Smoking cessation -nicotine patch is available  #Labs  -lipid panel, TSH, A1C are normal -EKG reviewed, sinus tachycardia with QTc 457  #Disposition -discharge to his group home tomorrow -follow up with  [facility]   Orson Slick, MD 03/22/2018, 1:50 PM

## 2018-03-21 NOTE — Plan of Care (Signed)
D: Pt denies SI/HI/AV hallucinations. Pt is pleasant and cooperative. Pt goal for today is to work on housing when discharge. Patient rated depression 0/10. Hopelessness 0/10 and anxiety 0/10 today.  A: Pt was offered support and encouragement. Pt was given scheduled medications. Pt was encourage to attend groups. Q 15 minute checks were done for safety.  R:Pt attends groups and interacts well with peers and staff. Pt is taking medication. Pt has no complaints.Pt receptive to treatment and safety maintained on unit.    Problem: Education: Goal: Utilization of techniques to improve thought processes will improve Outcome: Progressing Goal: Knowledge of the prescribed therapeutic regimen will improve Outcome: Progressing   Problem: Activity: Goal: Interest or engagement in leisure activities will improve Outcome: Progressing Goal: Imbalance in normal sleep/wake cycle will improve Outcome: Progressing   Problem: Coping: Goal: Coping ability will improve Outcome: Progressing Goal: Will verbalize feelings Outcome: Progressing   Problem: Health Behavior/Discharge Planning: Goal: Ability to make decisions will improve Outcome: Progressing Goal: Compliance with therapeutic regimen will improve Outcome: Progressing   Problem: Role Relationship: Goal: Will demonstrate positive changes in social behaviors and relationships Outcome: Progressing   Problem: Self-Concept: Goal: Will verbalize positive feelings about self Outcome: Progressing Goal: Level of anxiety will decrease Outcome: Progressing

## 2018-03-22 MED ORDER — PALIPERIDONE PALMITATE ER 234 MG/1.5ML IM SUSY: 234.0000 mg | PREFILLED_SYRINGE | INTRAMUSCULAR | 1 refills | Status: DC

## 2018-03-22 NOTE — Plan of Care (Signed)
Patient is not knowledgeable  information received staff continue to redirect information. Given.. Emotional and mental status improved . Voice no concerns around sleep  . No issues  with self control at present .  Continued to have  no programing at present  time . No coping concerns      No safety .Compliant  with medications .  Limited understanding of  unit programing .   Problem: Activity: Goal: Imbalance in normal sleep/wake cycle will improve Outcome: Progressing   Problem: Health Behavior/Discharge Planning: Goal: Compliance with therapeutic regimen will improve Outcome: Progressing   Problem: Self-Concept: Goal: Level of anxiety will decrease Outcome: Progressing

## 2018-03-22 NOTE — [facility] Group Notes (Signed)
03/22/2018 1PM  Type of Therapy/Topic:  Group Therapy:  Feelings about Diagnosis  Participation Level:  Did Not Attend   Description of Group:   This group will allow patients to explore their thoughts and feelings about diagnoses they have received. Patients will be guided to explore their level of understanding and acceptance of these diagnoses. Facilitator will encourage patients to process their thoughts and feelings about the reactions of others to their diagnosis and will guide patients in identifying ways to discuss their diagnosis with significant others in their lives. This group will be process-oriented, with patients participating in exploration of their own experiences, giving and receiving support, and processing challenge from other group members.   Therapeutic Goals: 1. Patient will demonstrate understanding of diagnosis as evidenced by identifying two or more symptoms of the disorder 2. Patient will be able to express two feelings regarding the diagnosis 3. Patient will demonstrate their ability to communicate their needs through discussion and/or role play  Summary of Patient Progress: Patient was encouraged and invited to attend group. Patient did not attend group. Social worker will continue to encourage group participation in the future.        Therapeutic Modalities:   Cognitive Behavioral Therapy Brief Therapy Feelings Identification    Darin Engels, LCSW 03/22/2018 3:44 PM

## 2018-03-22 NOTE — Discharge Summary (Addendum)
Physician Discharge Summary Note  Patient:  Kenneth [name]. is an 57 y.o., male MRN:  [id number] DOB:  09-20-1960 Patient phone:  [phone] (home)  Patient address:   [address] [place] [address],  Total Time spent with patient: 20 minutes plus 15 min on care coordination and documentation  Date of Admission:  03/16/2018 Date of Discharge: 03/23/2018  Reason for Admission:  Psychotic break.  History of Present Illness:   Identifying data. Kenneth [name] is a 57 year old male with a history of schizophrenia.  Chief complaint. "I don't know you."  History of present illness. Information was obtained from the patient and the chart. The patient was brought to the ER from a local truck stop where he was noticed to behave strangely. The patient himself is unable to provide much information. Apparently, he has been staying in a group home in [place] but was told that he is "not a good fit". It is likely that he walked to [place] from [place]. He is utterly confused but did know the address of his group home. He denies any problems. He is unable to answer simple questions, his thinking and speech are completely disorganized. He is trying to write down a phone number for me to call but unable to produce a single digit. He wanders if he could use our address. For what?  Past psychiatric history. The patient does not recognize me but I used to be his psychiatrist at [facility] state facility, in [place] over ten years ago. He was on "long term" unit and at that time, he still had family contact.  Family psychiatric history. Unknown.  Social history. Disabled from mental illness. He has been residing in his group home for 6 years without hospitalization.  Principal Problem: Schizophrenia, undifferentiated ([place]) Discharge Diagnoses: Patient Active Problem List   Diagnosis Date Noted  . Schizophrenia, undifferentiated ([place]) [F20.3] 03/16/2018    Priority: High  .  Noncompliance [Z91.19] 03/16/2018  . Tobacco use disorder [F17.200] 03/16/2018    Past Medical History:  Past Medical History:  Diagnosis Date  . Bipolar 1 disorder ([place])   . Hypertension   . Paranoid schizophrenia ([place])    History reviewed. No pertinent surgical history. Family History: History reviewed. No pertinent family history.  Social History:  Social History   Substance and Sexual Activity  Alcohol Use Not Currently     Social History   Substance and Sexual Activity  Drug Use Not on file    Social History   Socioeconomic History  . Marital status: Single    Spouse name: Not on file  . Number of children: Not on file  . Years of education: Not on file  . Highest education level: Not on file  Occupational History  . Not on file  Social Needs  . Financial resource strain: Not on file  . Food insecurity:    Worry: Not on file    Inability: Not on file  . Transportation needs:    Medical: Not on file    Non-medical: Not on file  Tobacco Use  . Smoking status: Current Every Day Smoker    Types: Cigarettes  . Smokeless tobacco: Never Used  Substance and Sexual Activity  . Alcohol use: Not Currently  . Drug use: Not on file  . Sexual activity: Not on file  Lifestyle  . Physical activity:    Days per week: Not on file    Minutes per session: Not on file  . Stress: Not on file  Relationships  . Social connections:    Talks on phone: Not on file    Gets together: Not on file    Attends religious service: Not on file    Active member of club or organization: Not on file    Attends meetings of clubs or organizations: Not on file    Relationship status: Not on file  Other Topics Concern  . Not on file  Social History Narrative  . Not on file    Hospital Course:    Kenneth [name] is a 57 year old male with a history of schizophrenia admitted for psychotic break in the context of treatment noncompliance. He was restarted on medications and tolerated them  well. At the time of discharge, the patient was nolonger psychotic.  #Psychosis, improved -continue Zyprexa 15 mg BID -we discontinued Risperdal .  -continue Invega sustenna 234 mg injections, next dose on 11/1/019 -Depakote 500 mg TID as mood stabilizer, VPA level 43  #Insomnia, improved with treatment -Trazodone 100 mg nightly -Restoril 30 mg nightly  #Smoking cessation -nicotine patch is available  #Labs  -lipid panel, TSH, A1C are normal -EKG reviewed, sinus tachycardia with QTc 457  #Disposition -discharge to his group home tomorrow -follow up with [facility]  Physical Findings: AIMS: Facial and Oral Movements Muscles of Facial Expression: None, normal Lips and Perioral Area: None, normal Jaw: None, normal Tongue: None, normal,Extremity Movements Upper (arms, wrists, hands, fingers): None, normal Lower (legs, knees, ankles, toes): None, normal, Trunk Movements Neck, shoulders, hips: None, normal, Overall Severity Severity of abnormal movements (highest score from questions above): None, normal Incapacitation due to abnormal movements: None, normal Patient's awareness of abnormal movements (rate only patient's report): No Awareness, Dental Status Current problems with teeth and/or dentures?: Yes Does patient usually wear dentures?: No  CIWA:    COWS:     Musculoskeletal: Strength & Muscle Tone: within normal limits Gait & Station: normal Patient leans: N/A  Psychiatric Specialty Exam: Physical Exam  Nursing note and vitals reviewed. Psychiatric: He has a normal mood and affect. His speech is normal and behavior is normal. Thought content normal. Cognition and memory are normal. He expresses impulsivity.    Review of Systems  Neurological: Negative.   Psychiatric/Behavioral: Negative.   All other systems reviewed and are negative.   Blood pressure 130/83, pulse 94, temperature 98.1 F (36.7 C), temperature source Oral, resp. rate 18, height 5\' 5"   (1.651 m), weight 76.7 kg, SpO2 99 %.Body mass index is 28.12 kg/m.  General Appearance: Casual  Eye Contact:  Good  Speech:  Clear and Coherent  Volume:  Normal  Mood:  Euthymic  Affect:  Flat  Thought Process:  Goal Directed and Descriptions of Associations: Intact  Orientation:  Full (Time, Place, and Person)  Thought Content:  WDL  Suicidal Thoughts:  No  Homicidal Thoughts:  No  Memory:  Immediate;   Poor Recent;   Poor Remote;   Poor  Judgement:  Impaired  Insight:  Shallow  Psychomotor Activity:  Normal  Concentration:  Concentration: Poor and Attention Span: Poor  Recall:  Poor  Fund of Knowledge:  Poor  Language:  Fair  Akathisia:  No  Handed:  Right  AIMS (if indicated):     Assets:  Communication Skills Desire for Improvement Financial Resources/Insurance Housing Physical Health Resilience Social Support  ADL's:  Intact  Cognition:  WNL  Sleep:  Number of Hours: 0.5     Have you used any form of tobacco in the last 30  days? (Cigarettes, Smokeless Tobacco, Cigars, and/or Pipes): Yes  Has this patient used any form of tobacco in the last 30 days? (Cigarettes, Smokeless Tobacco, Cigars, and/or Pipes) Yes, Yes, A prescription for an FDA-approved tobacco cessation medication was offered at discharge and the patient refused  Blood Alcohol level:  Lab Results  Component Value Date   [facility] <10 03/15/2018   ETH <11 41/32/4401    Metabolic Disorder Labs:  Lab Results  Component Value Date   HGBA1C 5.5 03/15/2018   MPG 111 03/15/2018   No results found for: PROLACTIN Lab Results  Component Value Date   CHOL 175 03/15/2018   TRIG 118 03/15/2018   HDL 35 (L) 03/15/2018   CHOLHDL 5.0 03/15/2018   VLDL 24 03/15/2018   LDLCALC 116 (H) 03/15/2018   LDLCALC 74 02/23/2012    See Psychiatric Specialty Exam and Suicide Risk Assessment completed by Attending Physician prior to discharge.  Discharge destination:  Home  Is patient on multiple antipsychotic  therapies at discharge:  Yes,   Do you recommend tapering to monotherapy for antipsychotics?  No   Has Patient had three or more failed trials of antipsychotic monotherapy by history:  Yes,   Antipsychotic medications that previously failed include:   1.  haldol., 2.  zyprexa. and 3.  prolixin.  Recommended Plan for Multiple Antipsychotic Therapies: Additional reason(s) for multiple antispychotic treatment:  inadequate response to a single agent  Discharge Instructions    Diet - low sodium heart healthy   Complete by:  As directed    Increase activity slowly   Complete by:  As directed      Allergies as of 03/23/2018   No Known Allergies     Medication List    STOP taking these medications   risperiDONE 2 MG tablet Commonly known as:  RISPERDAL   Valproic Acid 250 MG/5ML Syrp syrup Commonly known as:  DEPAKENE     TAKE these medications     Indication  clonazePAM 0.5 MG tablet Commonly known as:  KLONOPIN Take 0.5 mg by mouth 2 (two) times daily.  Indication:  Manic-Depression   divalproex 500 MG DR tablet Commonly known as:  DEPAKOTE Take 500 mg by mouth 3 (three) times daily.  Indication:  Schizophrenia   docusate sodium 100 MG capsule Commonly known as:  COLACE Take 100 mg by mouth 2 (two) times daily.  Indication:  Constipation   metoprolol tartrate 25 MG tablet Commonly known as:  LOPRESSOR Take 25 mg by mouth 2 (two) times daily.  Indication:  High Blood Pressure Disorder   OLANZapine 15 MG tablet Commonly known as:  ZYPREXA Take 15 mg by mouth 2 (two) times daily.  Indication:  Schizophrenia   paliperidone 234 MG/1.5ML Susy injection Commonly known as:  INVEGA SUSTENNA Inject 234 mg into the muscle every 28 (twenty-eight) days. Start taking on:  04/15/2018  Indication:  Schizophrenia   temazepam 30 MG capsule Commonly known as:  RESTORIL Take 1 capsule (30 mg total) by mouth at bedtime.  Indication:  Trouble Sleeping   traZODone 100 MG  tablet Commonly known as:  DESYREL Take 100 mg by mouth at bedtime.  Indication:  [place] on 03/24/2018.   Why:  Please follow up with your provider on Thursday March 24, 2018 at [facility]. Please bring your identification, insurance card, discharge summary and all medications that you are taking. Thank you.  Contact information: Address:  [address], [place], [place] [address] Phone: [phone] Fax: [phone]          Follow-up recommendations:  Activity:  as tolerated Diet:  low sodium heart healthy Other:  keep follow up appointments  Comments:    Signed: Orson Slick, MD 03/23/2018, 9:10 AM

## 2018-03-22 NOTE — Progress Notes (Signed)
Patient ID: Kenneth [name]., male   DOB: [birth year], 57 y.o.   MRN: [id number] DAR Note: Pt continue to be confused and disorganized. Pt at assessment continue to endorse moderate anxiety and some worrying, "everything is happening fast."  Pt denied depression, pain, SI/HI or AVA. All Pt's questions and concerns addressed. Pt does not look to be in any acute distress. Support, encouragement, and safe environment provided. Pt was med compliant. 15-minute safety checks continue.

## 2018-03-22 NOTE — BHH Suicide Risk Assessment (Signed)
[facility] Discharge Suicide Risk Assessment   Principal Problem: Schizophrenia, undifferentiated ([place]) Discharge Diagnoses:  Patient Active Problem List   Diagnosis Date Noted  . Schizophrenia, undifferentiated ([place]) [F20.3] 03/16/2018    Priority: High  . Noncompliance [Z91.19] 03/16/2018  . Tobacco use disorder [F17.200] 03/16/2018    Total Time spent with patient: 20 minutes  Musculoskeletal: Strength & Muscle Tone: within normal limits Gait & Station: normal Patient leans: N/A  Psychiatric Specialty Exam: Review of Systems  Neurological: Negative.   Psychiatric/Behavioral: Negative.   All other systems reviewed and are negative.   Blood pressure 130/83, pulse 94, temperature 98.1 F (36.7 C), temperature source Oral, resp. rate 18, height 5\' 5"  (1.651 m), weight 76.7 kg, SpO2 99 %.Body mass index is 28.12 kg/m.  General Appearance: Casual  Eye Contact::  Good  Speech:  Clear and Coherent409  Volume:  Normal  Mood:  Euthymic  Affect:  Appropriate  Thought Process:  Goal Directed and Descriptions of Associations: Intact  Orientation:  Full (Time, Place, and Person)  Thought Content:  WDL  Suicidal Thoughts:  No  Homicidal Thoughts:  No  Memory:  Immediate;   Poor Recent;   Poor Remote;   Poor  Judgement:  Poor  Insight:  Lacking  Psychomotor Activity:  Normal  Concentration:  Poor  Recall:  Poor  Fund of Knowledge:Poor  Language: Fair  Akathisia:  No  Handed:  Right  AIMS (if indicated):     Assets:  Communication Skills Desire for Improvement Financial Resources/Insurance Housing Physical Health Resilience Social Support  Sleep:  Number of Hours: 0.5  Cognition: WNL  ADL's:  Intact   Mental Status Per Nursing Assessment::   On Admission:  NA  Demographic Factors:  Male and Caucasian  Loss Factors: NA  Historical Factors: Prior suicide attempts and Impulsivity  Risk Reduction Factors:   Living with another person, especially a relative,  Positive social support and Positive therapeutic relationship  Continued Clinical Symptoms:  Schizophrenia:   Paranoid or undifferentiated type  Cognitive Features That Contribute To Risk:  None    Suicide Risk:  Minimal: No identifiable suicidal ideation.  Patients presenting with no risk factors but with morbid ruminations; may be classified as minimal risk based on the severity of the depressive symptoms  Follow-up Information    Cletis Athens MD. Go on 03/24/2018.   Why:  Please follow up with your provider on Thursday March 24, 2018 at [facility]. Please bring your identification, insurance card, discharge summary and all medications that you are taking. Thank you.  Contact information: Address: [address], [place], [place] [address] Phone: [phone] Fax: [phone]          Plan Of Care/Follow-up recommendations:  Activity:  as tolerated Diet:  low sodium heart healthy Other:  keep follow up appointments  Orson Slick, MD 03/23/2018, 9:07 AM

## 2018-03-22 NOTE — Progress Notes (Signed)
Continues to experience confusion. Visible in the milieu but unable to program in group activities. Alert and oriented. Denying thoughts of self harm. Thoughtful and soft spoken, patient has episodes of confusion and disorganized thinking. Received a snack and medication and currently in room, awake. Safety precautions reinforced.

## 2018-03-22 NOTE — Plan of Care (Signed)
Mild confusion noted. Incoherent but compliant with treatment. Unable to comprehend hi medication regime

## 2018-03-22 NOTE — BHH Counselor (Signed)
CSW called the patients group home to coordinate discharge plans. Mrs. Kenneth [name] was not in and CSW was told told to call back. CSW will call back later.  Darin Engels, MSW, Latanya Presser, [place] Clinical Social Worker 03/22/2018 2:51 PM

## 2018-03-22 NOTE — Progress Notes (Signed)
D: Patient stated slept good last night .Stated appetite is good and energy level  Is normal. Stated concentration poor Stated on Depression scale 0 , hopeless 0 and anxiety 0 .( low 0-10 high) Denies suicidal  homicidal ideations  .  No auditory hallucinations  No pain concerns . Appropriate ADL'S. Interacting with peers and staff. Patient is not knowledgeable  information received staff continue to redirect information given. Emotional and mental status improved . Voice no concerns around sleep  . No issues  with self control at present . Marland Kitchen No coping concerns      No safety .Compliant  with medications .  Limited understanding of  unit programing . Patient found in room voice of being cold . noted to have on a coat   With wet T Shirt underneath. Limited insight on what to do  Asked for help.   A: Encourage patient participation with unit programming . Instruction  Given on  Medication , verbalize understanding.  R: Voice no other concerns. Staff continue to monitor

## 2018-03-23 LAB — VALPROIC ACID LEVEL: Valproic Acid Lvl: 43 ug/mL — ABNORMAL LOW (ref 50.0–100.0)

## 2018-03-23 MED ORDER — TEMAZEPAM 30 MG PO CAPS: 30.0000 mg | ORAL_CAPSULE | Freq: Every day | ORAL | 1 refills | Status: DC

## 2018-03-23 MED ORDER — TEMAZEPAM 15 MG PO CAPS: 30.0000 mg | ORAL_CAPSULE | Freq: Every day | ORAL | Status: DC

## 2018-03-23 NOTE — Progress Notes (Signed)
Received Kenneth [name] this AM after breakfast, he received his medications. He denied all of the psychiatric symptoms and feels safe to be discharge home. He received his discharge order, the AVS was reviewed and his questions answered. He received his prescriptions and his personal belongings returned. He was discharged at 1320 hrs with his caretaker from the group home without incident.

## 2018-03-23 NOTE — Progress Notes (Signed)
Patient stayed in room. Was in bed but has not been able to sleep. Did not have any major concern and stated "I will be OK". Staff continue to monitor.

## 2018-03-23 NOTE — Progress Notes (Signed)
  [facility] Adult Case Management Discharge Plan :  Will you be returning to the same living situation after discharge:  Yes,  [facility] At discharge, do you have transportation home?: Yes,  Group home or Taxi. Do you have the ability to pay for your medications: Yes,  Insurance  Release of information consent forms completed and in the chart;  Patient's signature needed at discharge.  Patient to Follow up at: Follow-up Information    Cletis Athens MD. Go on 03/24/2018.   Why:  Please follow up with your provider on Thursday March 24, 2018 at [facility]. Please bring your identification, insurance card, discharge summary and all medications that you are taking. Thank you.  Contact information: Address: [address] [place], [place] [address] Phone: [phone] Fax: [phone]          Next level of care provider has access to [place] and Suicide Prevention discussed: Yes,  Completed with patient and Kathie Dike, [facility], [phone]   Have you used any form of tobacco in the last 30 days? (Cigarettes, Smokeless Tobacco, Cigars, and/or Pipes): Yes  Has patient been referred to the Quitline?: Patient refused referral  Patient has been referred for addiction treatment: N/A  Darin Engels, LCSW 03/23/2018, 9:18 AM

## 2018-03-23 NOTE — NC FL2 (Signed)
[place] LEVEL OF CARE SCREENING TOOL     IDENTIFICATION  Patient Name: Kenneth [name]. Birthdate: Mar 26, 1961 Sex: male Admission Date (Current Location): 03/16/2018  [place] and [place] Number:  Selena Lesser [id number] [place] and Address:  [facility], [address], [place], [place] [address]      Provider Number: [id number]  Attending Physician Name and Address:  Clovis Fredrickson, MD  Relative Name and Phone Number:  Kathie Dike, [facility] [place], [phone]    Current Level of Care: Hospital Recommended Level of Care: [facility] Prior Approval Number:    Date Approved/Denied:   PASRR Number:    Discharge Plan: Domiciliary (Rest home)    Current Diagnoses: Patient Active Problem List   Diagnosis Date Noted  . Schizophrenia, undifferentiated ([place]) 03/16/2018  . Noncompliance 03/16/2018  . Tobacco use disorder 03/16/2018    Orientation RESPIRATION BLADDER Height & Weight     Self, Time, Situation, Place  Normal Continent Weight: 169 lb (76.7 kg) Height:  5\' 5"  (165.1 cm)  BEHAVIORAL SYMPTOMS/MOOD NEUROLOGICAL BOWEL NUTRITION STATUS  (N/A) (N/A) Continent Diet  AMBULATORY STATUS COMMUNICATION OF NEEDS Skin   Independent Verbally Normal                       Personal Care Assistance Level of Assistance  Bathing, Feeding, Dressing, Total care Bathing Assistance: Independent Feeding assistance: Independent Dressing Assistance: Independent Total Care Assistance: Independent   Functional Limitations Info  Sight, Speech, Hearing Sight Info: Adequate Hearing Info: Adequate Speech Info: Adequate    SPECIAL CARE FACTORS FREQUENCY  (None)                    Contractures Contractures Info: Not present    Additional Factors Info  Psychotropic(Medications)     Psychotropic Info: See MAR         Current Medications (03/23/2018):  This is the current hospital active medication  list Current Facility-Administered Medications  Medication Dose Route Frequency Provider Last Rate Last Dose  . acetaminophen (TYLENOL) tablet 650 mg  650 mg Oral Q6H PRN Clapacs, John T, MD      . alum & mag hydroxide-simeth (MAALOX/MYLANTA) 200-200-20 MG/5ML suspension 30 mL  30 mL Oral Q4H PRN Clapacs, John T, MD      . divalproex (DEPAKOTE) DR tablet 500 mg  500 mg Oral TID Clapacs, John T, MD   500 mg at 03/23/18 0740  . hydrOXYzine (ATARAX/VISTARIL) tablet 50 mg  50 mg Oral TID PRN Clapacs, Madie Reno, MD   50 mg at 03/17/18 2201  . magnesium hydroxide (MILK OF MAGNESIA) suspension 30 mL  30 mL Oral Daily PRN Clapacs, John T, MD      . nicotine (NICODERM CQ - dosed in mg/24 hours) patch 21 mg  21 mg Transdermal Daily Pucilowska, Jolanta B, MD   21 mg at 03/23/18 0741  . OLANZapine (ZYPREXA) tablet 15 mg  15 mg Oral BID Clapacs, Madie Reno, MD   15 mg at 03/23/18 0740  . paliperidone (INVEGA SUSTENNA) injection 234 mg  234 mg Intramuscular Q28 days Pucilowska, Jolanta B, MD   234 mg at 03/18/18 2326  . risperiDONE (RISPERDAL) tablet 2 mg  2 mg Oral BID Clapacs, Madie Reno, MD   2 mg at 03/23/18 0740  . temazepam (RESTORIL) capsule 30 mg  30 mg Oral QHS Pucilowska, Jolanta B, MD      . traZODone (DESYREL) tablet 100  mg  100 mg Oral QHS He, Jun, MD   100 mg at 03/22/18 2146     Discharge Medications: Please see discharge summary for a list of discharge medications.  Relevant Imaging Results:  Relevant Lab Results:   Additional Information SSN [id number]  Darin Engels, LCSW

## 2018-03-23 NOTE — Progress Notes (Signed)
Recreation Therapy Notes  INPATIENT RECREATION TR PLAN  Patient Details Name: Kenneth [name]. MRN: [id number] DOB: 1961/06/07 Today's Date: 03/23/2018  Rec Therapy Plan Is patient appropriate for Therapeutic Recreation?: Yes Treatment times per week: at least 3 Estimated Length of Stay: 5-7 days TR Treatment/Interventions: Group participation (Comment)  Discharge Criteria Pt will be discharged from therapy if:: Discharged Treatment plan/goals/alternatives discussed and agreed upon by:: Patient/family  Discharge Summary Short term goals set:  Patient will focus on task/topic with 2 prompts from staff within 5 recreation therapy group sessions Short term goals met: Not met Reason goals not met: Patient did not attend any groups Therapeutic equipment acquired: N/A Reason patient discharged from therapy: Discharge from hospital Pt/family agrees with progress & goals achieved: Yes Date patient discharged from therapy: 03/23/18   Devyne Hauger 03/23/2018, 4:23 PM

## 2018-03-23 NOTE — Progress Notes (Signed)
Recreation Therapy Notes  Date: 03/23/2018  Time: 9:30 am   Location: Craft room   Behavioral response: N/A   Intervention Topic: Relaxation  Discussion/Intervention: Patient did not attend group.   Clinical Observations/Feedback:  Patient did not attend group.   Kevonta Phariss LRT/CTRS        Noralyn Karim 03/23/2018 12:18 PM

## 2018-06-23 DIAGNOSIS — M5412 Radiculopathy, cervical region: Secondary | ICD-10-CM | POA: Diagnosis not present

## 2018-06-23 DIAGNOSIS — I10 Essential (primary) hypertension: Secondary | ICD-10-CM | POA: Diagnosis not present

## 2018-07-27 DIAGNOSIS — M5090 Cervical disc disorder, unspecified, unspecified cervical region: Secondary | ICD-10-CM | POA: Diagnosis not present

## 2018-07-27 DIAGNOSIS — M5412 Radiculopathy, cervical region: Secondary | ICD-10-CM | POA: Diagnosis not present

## 2018-07-27 DIAGNOSIS — Z720 Tobacco use: Secondary | ICD-10-CM | POA: Diagnosis not present

## 2018-11-21 ENCOUNTER — Other Ambulatory Visit: Payer: Self-pay

## 2018-11-21 ENCOUNTER — Emergency Department
Admission: EM | Admit: 2018-11-21 | Discharge: 2018-11-21 | Disposition: A | Discharge status: Home / Self Care | Payer: Medicare Other | Attending: Emergency Medicine | Admitting: Emergency Medicine

## 2018-11-21 DIAGNOSIS — F17210 Nicotine dependence, cigarettes, uncomplicated: Secondary | ICD-10-CM | POA: Diagnosis not present

## 2018-11-21 DIAGNOSIS — Z9119 Patient's noncompliance with other medical treatment and regimen: Secondary | ICD-10-CM | POA: Diagnosis not present

## 2018-11-21 DIAGNOSIS — Z79899 Other long term (current) drug therapy: Secondary | ICD-10-CM | POA: Insufficient documentation

## 2018-11-21 DIAGNOSIS — I10 Essential (primary) hypertension: Secondary | ICD-10-CM | POA: Diagnosis not present

## 2018-11-21 DIAGNOSIS — F17200 Nicotine dependence, unspecified, uncomplicated: Secondary | ICD-10-CM

## 2018-11-21 DIAGNOSIS — Z046 Encounter for general psychiatric examination, requested by authority: Secondary | ICD-10-CM | POA: Diagnosis present

## 2018-11-21 DIAGNOSIS — F203 Undifferentiated schizophrenia: Secondary | ICD-10-CM | POA: Diagnosis not present

## 2018-11-21 DIAGNOSIS — F209 Schizophrenia, unspecified: Secondary | ICD-10-CM

## 2018-11-21 LAB — CBC
HCT: 45.1 % (ref 39.0–52.0)
Hemoglobin: 15.2 g/dL (ref 13.0–17.0)
MCH: 29.6 pg (ref 26.0–34.0)
MCHC: 33.7 g/dL (ref 30.0–36.0)
MCV: 87.9 fL (ref 80.0–100.0)
Platelets: 209 10*3/uL (ref 150–400)
RBC: 5.13 MIL/uL (ref 4.22–5.81)
RDW: 12.8 % (ref 11.5–15.5)
WBC: 7.9 10*3/uL (ref 4.0–10.5)
nRBC: 0 % (ref 0.0–0.2)

## 2018-11-21 LAB — URINE DRUG SCREEN, QUALITATIVE (ARMC ONLY)
Amphetamines, Ur Screen: NOT DETECTED
Barbiturates, Ur Screen: NOT DETECTED
Benzodiazepine, Ur Scrn: NOT DETECTED
Cannabinoid 50 Ng, Ur ~~LOC~~: NOT DETECTED
Cocaine Metabolite,Ur ~~LOC~~: NOT DETECTED
MDMA (Ecstasy)Ur Screen: NOT DETECTED
Methadone Scn, Ur: NOT DETECTED
Opiate, Ur Screen: NOT DETECTED
Phencyclidine (PCP) Ur S: NOT DETECTED
Tricyclic, Ur Screen: NOT DETECTED

## 2018-11-21 LAB — SALICYLATE LEVEL: Salicylate Lvl: <7 mg/dL (ref 2.8–30.0)

## 2018-11-21 LAB — COMPREHENSIVE METABOLIC PANEL
ALT: 24 U/L (ref 0–44)
AST: 32 U/L (ref 15–41)
Albumin: 4.4 g/dL (ref 3.5–5.0)
Alkaline Phosphatase: 60 U/L (ref 38–126)
Anion gap: 7 (ref 5–15)
BUN: 8 mg/dL (ref 6–20)
CO2: 27 mmol/L (ref 22–32)
Calcium: 9.1 mg/dL (ref 8.9–10.3)
Chloride: 104 mmol/L (ref 98–111)
Creatinine, Ser: 0.89 mg/dL (ref 0.61–1.24)
GFR calc Af Amer: >60 mL/min (ref >60)
GFR calc non Af Amer: >60 mL/min (ref >60)
Glucose, Bld: 90 mg/dL (ref 70–99)
Potassium: 3.3 mmol/L — ABNORMAL LOW (ref 3.5–5.1)
Sodium: 138 mmol/L (ref 135–145)
Total Bilirubin: 0.5 mg/dL (ref 0.3–1.2)
Total Protein: 7.1 g/dL (ref 6.5–8.1)

## 2018-11-21 LAB — ETHANOL: Alcohol, Ethyl (B): <10 mg/dL (ref <10)

## 2018-11-21 LAB — ACETAMINOPHEN LEVEL: Acetaminophen (Tylenol), Serum: <10 ug/mL — ABNORMAL LOW (ref 10–30)

## 2018-11-21 NOTE — ED Notes (Signed)
Pt transferred into ED BHU room 6  Patient assigned to appropriate care area. Patient oriented to unit/care area: Informed that, for his safety, care areas are designed for safety and monitored by security cameras at all times;  phone times explained to patient. Patient verbalizes understanding, and verbal contract for safety obtained.   He denies pain  Assessment completed  VSS

## 2018-11-21 NOTE — Consult Note (Signed)
[facility] Consult   Reason for Consult: Schizophrenia Referring Physician: Dr. Jimmye Norman Patient Identification: Kenneth [name]. MRN:  [id number] Principal Diagnosis: Schizophrenia, undifferentiated ([place]) Diagnosis:   Patient Active Problem List   Diagnosis Date Noted  . Schizophrenia, undifferentiated ([place]) [F20.3] 03/16/2018  . Noncompliance [Z91.19] 03/16/2018  . Tobacco use disorder [F17.200] 03/16/2018    Total Time spent with patient: 1 hour  Subjective: "I just want to smoke."  HPI:  Kenneth Majerus. is a 58 y.o. male with a history of schizophrenia, bipolar, hypertension who presents voluntarily from his group home.  Patient has been living in this group home for 6 months he does not like it there.  He does not like the 9 PM curfew or the fact that he cannot go outside to smoke.  No verbal or physical abuse.  No suicidal or homicidal ideation, no depression, no hallucinations.  Patient reports that he has been taking his medications.  Patient denies any medical complaints.  Patient called the social services today requesting help to be transferred to a different group home.  Social services forwarded patient's call to the police department who sent an ambulance to bring patient to the emergency room for social work evaluation and possible help with placement at a different group home.  Patient is a smoker but denies alcohol or drug use  On evaluation, patient is calm and cooperative.  He continues to deny suicidal or homicidal ideation.  He reports that he is compliant with medications and his hallucinations are controlled.  He states he has been frustrated with quarantine restrictions due to COVID-19, and now with curfew due to protest demonstrations.  Patient states he would like to be able to smoke in the evenings as he used to.  When discussing the curfew regulations that have been put into place by the governor for maintaining community safety.  He reports  that he understands.  He states he wishes to go back to [facility].  He has had no behavioral problems in the emergency department.  Group home was contacted and agreed for patient to come home.  They deny that he has had any behavioral issues at home.  Per chart review with updates: Social history: Patient says he does not have a guardian but has been living in the [facility].   Medical history: History of high blood pressure.   Substance abuse history: No history of alcohol or drug abuse  Past Psychiatric History: Patient has a history of schizophrenia.  He does have a distant history of suicide attempts and aggression and has had several hospitalizations, last hospitalization at [facility] October 2019.  Risk to Self:  Denies  risk to Others:  Denies Prior Inpatient Therapy:  Yes, last 03/2018 Prior Outpatient Therapy:  Patient uncertain  Past Medical History:  Past Medical History:  Diagnosis Date  . Bipolar 1 disorder ([place])   . Hypertension   . Paranoid schizophrenia ([place])    No past surgical history on file. Family History: No family history on file. Family Psychiatric  History: Does not know of any Social History:  Social History   Substance and Sexual Activity  Alcohol Use Not Currently     Social History   Substance and Sexual Activity  Drug Use Not on file    Social History   Socioeconomic History  . Marital status: Single    Spouse name: Not on file  . Number of children: Not on  file  . Years of education: Not on file  . Highest education level: Not on file  Occupational History  . Not on file  Social Needs  . Financial resource strain: Not on file  . Food insecurity:    Worry: Not on file    Inability: Not on file  . Transportation needs:    Medical: Not on file    Non-medical: Not on file  Tobacco Use  . Smoking status: Current Every Day Smoker    Types: Cigarettes  . Smokeless tobacco: Never Used  Substance and Sexual  Activity  . Alcohol use: Not Currently  . Drug use: Not on file  . Sexual activity: Not on file  Lifestyle  . Physical activity:    Days per week: Not on file    Minutes per session: Not on file  . Stress: Not on file  Relationships  . Social connections:    Talks on phone: Not on file    Gets together: Not on file    Attends religious service: Not on file    Active member of club or organization: Not on file    Attends meetings of clubs or organizations: Not on file    Relationship status: Not on file  Other Topics Concern  . Not on file  Social History Narrative  . Not on file   Additional Social History:  [facility]ives [facility], Carylon Perches is a care provider there.  Patient does not know phone numbers  Allergies:  No Known Allergies  Labs:  Results for orders placed or performed during the hospital encounter of 11/21/18 (from the past 48 hour(s))  Comprehensive metabolic panel     Status: Abnormal   Collection Time: 11/21/18  2:33 AM  Result Value Ref Range   Sodium 138 135 - 145 mmol/L   Potassium 3.3 (L) 3.5 - 5.1 mmol/L   Chloride 104 98 - 111 mmol/L   CO2 27 22 - 32 mmol/L   Glucose, Bld 90 70 - 99 mg/dL   BUN 8 6 - 20 mg/dL   Creatinine, Ser 0.89 0.61 - 1.24 mg/dL   Calcium 9.1 8.9 - 10.3 mg/dL   Total Protein 7.1 6.5 - 8.1 g/dL   Albumin 4.4 3.5 - 5.0 g/dL   AST 32 15 - 41 U/L   ALT 24 0 - 44 U/L   Alkaline Phosphatase 60 38 - 126 U/L   Total Bilirubin 0.5 0.3 - 1.2 mg/dL   GFR calc non Af Amer >60 >60 mL/min   GFR calc Af Amer >60 >60 mL/min   Anion gap 7 5 - 15    Comment: Performed at [facility], [address], [place], [place] [address]  Ethanol     Status: None   Collection Time: 11/21/18  2:33 AM  Result Value Ref Range   Alcohol, Ethyl (B) <10 <10 mg/dL    Comment: (NOTE) Lowest detectable limit for serum alcohol is 10 mg/dL. For medical purposes only. Performed at [facility], [address]  [place], [place] [address]   Salicylate level     Status: None   Collection Time: 11/21/18  2:33 AM  Result Value Ref Range   Salicylate Lvl <0.3 2.8 - 30.0 mg/dL    Comment: Performed at [facility], [address], [place], [place] [address]  Acetaminophen level     Status: Abnormal   Collection Time: 11/21/18  2:33 AM  Result Value Ref Range  Acetaminophen (Tylenol), Serum <10 (L) 10 - 30 ug/mL    Comment: (NOTE) Therapeutic concentrations vary significantly. A range of 10-30 ug/mL  may be an effective concentration for many patients. However, some  are best treated at concentrations outside of this range. Acetaminophen concentrations >150 ug/mL at 4 hours after ingestion  and >50 ug/mL at 12 hours after ingestion are often associated with  toxic reactions. Performed at [facility], [address] [place], [place] [address]   cbc     Status: None   Collection Time: 11/21/18  2:33 AM  Result Value Ref Range   WBC 7.9 4.0 - 10.5 K/uL   RBC 5.13 4.22 - 5.81 MIL/uL   Hemoglobin 15.2 13.0 - 17.0 g/dL   HCT 45.1 39.0 - 52.0 %   MCV 87.9 80.0 - 100.0 fL   MCH 29.6 26.0 - 34.0 pg   MCHC 33.7 30.0 - 36.0 g/dL   RDW 12.8 11.5 - 15.5 %   Platelets 209 150 - 400 K/uL   nRBC 0.0 0.0 - 0.2 %    Comment: Performed at [facility], [address] [place], [place] [address]  Urine Drug Screen, Qualitative     Status: None   Collection Time: 11/21/18  2:33 AM  Result Value Ref Range   Tricyclic, Ur Screen NONE DETECTED NONE DETECTED   Amphetamines, Ur Screen NONE DETECTED NONE DETECTED   MDMA (Ecstasy)Ur Screen NONE DETECTED NONE DETECTED   Cocaine Metabolite,Ur [place] NONE DETECTED NONE DETECTED   Opiate, Ur Screen NONE DETECTED NONE DETECTED   Phencyclidine (PCP) Ur S NONE DETECTED NONE DETECTED   Cannabinoid 50 Ng, Ur [place] NONE DETECTED NONE DETECTED   Barbiturates, Ur Screen NONE DETECTED NONE DETECTED   Benzodiazepine, Ur Scrn NONE DETECTED NONE DETECTED    Methadone Scn, Ur NONE DETECTED NONE DETECTED    Comment: (NOTE) Tricyclics + metabolites, urine    Cutoff 1000 ng/mL Amphetamines + metabolites, urine  Cutoff 1000 ng/mL MDMA (Ecstasy), urine              Cutoff 500 ng/mL Cocaine Metabolite, urine          Cutoff 300 ng/mL Opiate + metabolites, urine        Cutoff 300 ng/mL Phencyclidine (PCP), urine         Cutoff 25 ng/mL Cannabinoid, urine                 Cutoff 50 ng/mL Barbiturates + metabolites, urine  Cutoff 200 ng/mL Benzodiazepine, urine              Cutoff 200 ng/mL Methadone, urine                   Cutoff 300 ng/mL The urine drug screen provides only a preliminary, unconfirmed analytical test result and should not be used for non-medical purposes. Clinical consideration and professional judgment should be applied to any positive drug screen result due to possible interfering substances. A more specific alternate chemical method must be used in order to obtain a confirmed analytical result. Gas chromatography / mass spectrometry (GC/MS) is the preferred confirmat ory method. Performed at [facility], [address] [place], [place] [address]     No current facility-administered medications for this encounter.    Current Outpatient Medications  Medication Sig Dispense Refill  . clonazePAM (KLONOPIN) 0.5 MG tablet Take 0.5 mg by mouth 2 (two) times daily.    . divalproex (DEPAKOTE) 500 MG DR tablet  Take 500 mg by mouth 3 (three) times daily.    Marland Kitchen docusate sodium (COLACE) 100 MG capsule Take 100 mg by mouth 2 (two) times daily.    . metoprolol tartrate (LOPRESSOR) 25 MG tablet Take 25 mg by mouth 2 (two) times daily.    Marland Kitchen OLANZapine (ZYPREXA) 15 MG tablet Take 15 mg by mouth 2 (two) times daily.    . paliperidone (INVEGA SUSTENNA) 234 MG/1.5ML SUSY injection Inject 234 mg into the muscle every 28 (twenty-eight) days. 1.8 mL 1  . temazepam (RESTORIL) 30 MG capsule Take 1 capsule (30 mg total) by mouth at  bedtime. 30 capsule 1  . traZODone (DESYREL) 100 MG tablet Take 100 mg by mouth at bedtime.      Musculoskeletal: Strength & Muscle Tone: within normal limits Gait & Station: normal Patient leans: N/A  Psychiatric Specialty Exam: Physical Exam  Nursing note and vitals reviewed. Constitutional: He appears well-developed and well-nourished.  HENT:  Head: Normocephalic and atraumatic.  Eyes: Pupils are equal, round, and reactive to light. Conjunctivae are normal.  Neck: Normal range of motion.  Cardiovascular: Regular rhythm and normal heart sounds.  Respiratory: Effort normal. No respiratory distress.  GI: Soft.  Musculoskeletal: Normal range of motion.  Neurological: He is alert.  Skin: Skin is warm and dry.  Psychiatric: He has a normal mood and affect. His speech is normal and behavior is normal. Thought content is not paranoid. He expresses no homicidal and no suicidal ideation.    Review of Systems  Constitutional: Negative.   HENT: Negative.   Eyes: Negative.   Respiratory: Negative.   Cardiovascular: Negative.   Gastrointestinal: Negative.   Musculoskeletal: Negative.   Skin: Negative.   Neurological: Negative.   Psychiatric/Behavioral: Negative for depression, hallucinations, memory loss, substance abuse and suicidal ideas. The patient is not nervous/anxious and does not have insomnia.     Blood pressure (!) 145/98, pulse 89, temperature 98.5 F (36.9 C), temperature source Oral, resp. rate 18, height 5\' 11"  (1.803 m), weight 78 kg, SpO2 99 %.Body mass index is 23.99 kg/m.  General Appearance: Casual and Neat  Eye Contact:  Fair  Speech:  Clear and Coherent and Normal Rate  Volume:  Decreased  Mood:  Euthymic  Affect:  Blunt  Thought Process:  Goal Directed  Orientation:  Full (Time, Place, and Person)  Thought Content:  Logical and Hallucinations: None  Suicidal Thoughts:  No  Homicidal Thoughts:  No  Memory:  Immediate;   Fair Recent;   Fair Remote;   Fair   Judgement:  Impaired  Insight:  Shallow  Psychomotor Activity:  Normal  Concentration:  Concentration: Good  Recall:  [place] of Knowledge:  Fair  Language:  Good  Akathisia:  No  Handed:  Right  AIMS (if indicated):     Assets:  Communication Skills Desire for Improvement Financial Resources/Insurance Housing Physical Health Social Support  ADL's:  Intact  Cognition:  WNL  Sleep:   Adequate     Treatment Plan Summary: Plan No change in medication.  Patient is able to verbalize understanding of need to follow quarantine and curfew orders.  He has no behavioral issues and does not pose a threat to himself or others.  He is medication compliant with psychiatric symptoms controlled.  Disposition: No evidence of imminent risk to self or others at present.   Supportive therapy provided about ongoing stressors. Discussed crisis plan, support from social network, calling 911, coming to the Emergency Department, and calling  Suicide Hotline. Patient may be discharged back into the care of his group home.  Lavella Hammock, MD 11/21/2018 10:08 AM

## 2018-11-21 NOTE — ED Notes (Signed)
ED BHU [place] Is the patient under IVC or is there intent for IVC: no Is the patient medically cleared: Yes.   Is there vacancy in the ED BHU: Yes.   Is the population mix appropriate for patient: Yes.   Is the patient awaiting placement in inpatient or outpatient setting: Yes. Return to his group home   Has the patient had a psychiatric consult: Yes.   Survey of unit performed for contraband, proper placement and condition of furniture, tampering with fixtures in bathroom, shower, and each patient room: Yes.  ; Findings:  APPEARANCE/BEHAVIOR Calm and cooperative NEURO ASSESSMENT Orientation: oriented x3  Denies pain Hallucinations: No.None noted (Hallucinations) denies Speech: Normal Gait: normal RESPIRATORY ASSESSMENT Even  Unlabored respirations  CARDIOVASCULAR ASSESSMENT Pulses equal   regular rate  Skin warm and dry   GASTROINTESTINAL ASSESSMENT no GI complaint EXTREMITIES Full ROM  PLAN OF CARE Provide calm/safe environment. Vital signs assessed twice daily. ED BHU Assessment once each 12-hour shift. Collaborate with TTS daily or as condition indicates. Assure the ED provider has rounded once each shift. Provide and encourage hygiene. Provide redirection as needed. Assess for escalating behavior; address immediately and inform ED provider.  Assess family dynamic and appropriateness for visitation as needed: Yes.  ; If necessary, describe findings:  Educate the patient/family about BHU procedures/visitation: Yes.  ; If necessary, describe findings:

## 2018-11-21 NOTE — ED Triage Notes (Signed)
Patient reports not feeling safe at group home due to the way person at the group home talked to him.

## 2018-11-21 NOTE — BH Assessment (Signed)
Writer spoke with [place] ([phone]) and informed them he was ready for discharge. She states she was going to pick him up at approximately 4:00pm-4:30pm. Writer updated patient's nurse (Amy T.).

## 2018-11-21 NOTE — ED Provider Notes (Signed)
[facility] Emergency Department Provider Note  ____________________________________________  Time seen: Approximately 5:35 AM  I have reviewed the triage vital signs and the nursing notes.   HISTORY  Chief Complaint Psychiatric Evaluation   HPI Kenneth [name]. is a 58 y.o. male with a history of schizophrenia, bipolar, hypertension who presents voluntarily from his group home.  Patient has been living in this group home for 6 months he does not like it there.  He does not like the 9 PM curfew or the fact that he cannot go outside to smoke.  No verbal or physical abuse.  No suicidal or homicidal ideation, no depression, no hallucinations.  Patient reports that he has been taking his medications.  Patient denies any medical complaints.  Patient called the social services today requesting help to be transferred to a different group home.  Social services forwarded patient's call to the police department who sent an ambulance to bring patient to the emergency room for social work evaluation and possible help with placement at a different group home.  Patient is a smoker but denies alcohol or drug use  Past Medical History:  Diagnosis Date  . Bipolar 1 disorder ([place])   . Hypertension   . Paranoid schizophrenia [facility])     Patient Active Problem List   Diagnosis Date Noted  . Schizophrenia, undifferentiated ([place]) 03/16/2018  . Noncompliance 03/16/2018  . Tobacco use disorder 03/16/2018    No past surgical history on file.  Prior to Admission medications   Medication Sig Start Date End Date Taking? Authorizing Provider  clonazePAM (KLONOPIN) 0.5 MG tablet Take 0.5 mg by mouth 2 (two) times daily.    [provider]  divalproex (DEPAKOTE) 500 MG DR tablet Take 500 mg by mouth 3 (three) times daily.    [provider]  docusate sodium (COLACE) 100 MG capsule Take 100 mg by mouth 2 (two) times daily.    [provider]  metoprolol  tartrate (LOPRESSOR) 25 MG tablet Take 25 mg by mouth 2 (two) times daily.    [provider]  OLANZapine (ZYPREXA) 15 MG tablet Take 15 mg by mouth 2 (two) times daily.    [provider]  paliperidone (INVEGA SUSTENNA) 234 MG/1.5ML SUSY injection Inject 234 mg into the muscle every 28 (twenty-eight) days. 04/15/18   Pucilowska, Jolanta B, MD  temazepam (RESTORIL) 30 MG capsule Take 1 capsule (30 mg total) by mouth at bedtime. 03/23/18   Pucilowska, Herma Ard B, MD  traZODone (DESYREL) 100 MG tablet Take 100 mg by mouth at bedtime.    [provider]    Allergies Patient has no known allergies.  No family history on file.  Social History Social History   Tobacco Use  . Smoking status: Current Every Day Smoker    Types: Cigarettes  . Smokeless tobacco: Never Used  Substance Use Topics  . Alcohol use: Not Currently  . Drug use: Not on file    Review of Systems  Constitutional: Negative for fever. Eyes: Negative for visual changes. ENT: Negative for sore throat. Neck: No neck pain  Cardiovascular: Negative for chest pain. Respiratory: Negative for shortness of breath. Gastrointestinal: Negative for abdominal pain, vomiting or diarrhea. Genitourinary: Negative for dysuria. Musculoskeletal: Negative for back pain. Skin: Negative for rash. Neurological: Negative for headaches, weakness or numbness. Psych: No SI or HI  ____________________________________________   PHYSICAL EXAM:  VITAL SIGNS: ED Triage Vitals  Enc Vitals Group     BP 11/21/18  0205 (!) 142/100     Pulse Rate 11/21/18 0205 (!) 105     Resp 11/21/18 0205 18     Temp 11/21/18 0205 98.7 F (37.1 C)     Temp Source 11/21/18 0205 Oral     SpO2 11/21/18 0205 96 %     Weight 11/21/18 0200 172 lb (78 kg)     Height 11/21/18 0200 5\' 11"  (1.803 m)     Head Circumference --      Peak Flow --      Pain Score 11/21/18 0200 0     Pain Loc --      Pain Edu? --      Excl. in [place]? --      Constitutional: Alert and oriented. Well appearing and in no apparent distress. HEENT:      Head: Normocephalic and atraumatic.         Eyes: Conjunctivae are normal. Sclera is non-icteric.       Mouth/Throat: Mucous membranes are moist.       Neck: Supple with no signs of meningismus. Cardiovascular: Regular rate and rhythm. No murmurs, gallops, or rubs.  Respiratory: Normal respiratory effort. Lungs are clear to auscultation bilaterally.  Gastrointestinal: Soft, non tender, and non distended with positive bowel sounds. No rebound or guarding. Musculoskeletal: Nontender with normal range of motion in all extremities. No edema, cyanosis, or erythema of extremities. Neurologic: Normal speech and language. Face is symmetric. Moving all extremities. No gross focal neurologic deficits are appreciated. Skin: Skin is warm, dry and intact. No rash noted. Psychiatric: Mood and affect are normal. Speech and behavior are normal.  ____________________________________________   LABS (all labs ordered are listed, but only abnormal results are displayed)  Labs Reviewed  COMPREHENSIVE METABOLIC PANEL - Abnormal; Notable for the following components:      Result Value   Potassium 3.3 (*)    All other components within normal limits  ACETAMINOPHEN LEVEL - Abnormal; Notable for the following components:   Acetaminophen (Tylenol), Serum <10 (*)    All other components within normal limits  ETHANOL  SALICYLATE LEVEL  CBC  URINE DRUG SCREEN, QUALITATIVE ([facility] ONLY)   ____________________________________________  EKG  none  ____________________________________________  RADIOLOGY  none  ____________________________________________   PROCEDURES  Procedure(s) performed: None Procedures Critical Care performed:  None ____________________________________________   INITIAL IMPRESSION / ASSESSMENT AND PLAN / ED COURSE  58 y.o. male with a history of schizophrenia, bipolar, hypertension who  presents voluntarily from his group home requesting help for placement in a different group home since patient does not like the curfew of 9 PM and also the fact that he cannot go outside to smoke.  No indication for IVC placement.  Labs for medical clearance with no significant findings.  Will consult psychiatry.       As part of my medical decision making, I reviewed the following data within the [place] notes reviewed and incorporated, Labs reviewed , Old chart reviewed, A consult was requested and obtained from this/these consultant(s) Psychiatry, Notes from prior ED visits and [place] Controlled Substance Database    Pertinent labs & imaging results that were available during my care of the patient were reviewed by me and considered in my medical decision making (see chart for details).    ____________________________________________   FINAL CLINICAL IMPRESSION(S) / ED DIAGNOSES  Final diagnoses:  Schizophrenia, unspecified type ([place])      NEW MEDICATIONS STARTED DURING THIS VISIT:  ED Discharge Orders  None       Note:  This document was prepared using Dragon voice recognition software and may include unintentional dictation errors.    Alfred Levins, [place], MD 11/21/18 [phone]

## 2018-11-21 NOTE — ED Notes (Signed)
BEHAVIORAL HEALTH ROUNDING Patient sleeping: No. Patient alert and oriented: yes Behavior appropriate: Yes.  ; If no, describe:  Nutrition and fluids offered: yes Toileting and hygiene offered: Yes  Sitter present: q15 minute observations and security camera monitoring Law enforcement present: Yes  ODS  

## 2018-11-21 NOTE — ED Notes (Signed)

## 2018-12-14 DIAGNOSIS — R451 Restlessness and agitation: Secondary | ICD-10-CM | POA: Diagnosis not present

## 2018-12-14 DIAGNOSIS — Z720 Tobacco use: Secondary | ICD-10-CM | POA: Diagnosis not present

## 2018-12-14 DIAGNOSIS — I10 Essential (primary) hypertension: Secondary | ICD-10-CM | POA: Diagnosis not present

## 2018-12-28 DIAGNOSIS — I10 Essential (primary) hypertension: Secondary | ICD-10-CM | POA: Diagnosis not present

## 2018-12-28 DIAGNOSIS — Z720 Tobacco use: Secondary | ICD-10-CM | POA: Diagnosis not present

## 2018-12-28 DIAGNOSIS — R451 Restlessness and agitation: Secondary | ICD-10-CM | POA: Diagnosis not present

## 2019-01-25 DIAGNOSIS — Z79899 Other long term (current) drug therapy: Secondary | ICD-10-CM | POA: Diagnosis not present

## 2019-01-25 DIAGNOSIS — I10 Essential (primary) hypertension: Secondary | ICD-10-CM | POA: Diagnosis not present

## 2019-01-25 DIAGNOSIS — Z720 Tobacco use: Secondary | ICD-10-CM | POA: Diagnosis not present

## 2019-02-08 DIAGNOSIS — Z720 Tobacco use: Secondary | ICD-10-CM | POA: Diagnosis not present

## 2019-02-08 DIAGNOSIS — I10 Essential (primary) hypertension: Secondary | ICD-10-CM | POA: Diagnosis not present

## 2019-06-27 DIAGNOSIS — R05 Cough: Secondary | ICD-10-CM | POA: Diagnosis not present

## 2019-07-03 DIAGNOSIS — Z79899 Other long term (current) drug therapy: Secondary | ICD-10-CM | POA: Diagnosis not present

## 2019-07-03 DIAGNOSIS — I10 Essential (primary) hypertension: Secondary | ICD-10-CM | POA: Diagnosis not present

## 2019-07-03 DIAGNOSIS — R451 Restlessness and agitation: Secondary | ICD-10-CM | POA: Diagnosis not present

## 2019-07-07 DIAGNOSIS — R05 Cough: Secondary | ICD-10-CM | POA: Diagnosis not present

## 2019-07-21 DIAGNOSIS — R05 Cough: Secondary | ICD-10-CM | POA: Diagnosis not present

## 2019-08-11 DIAGNOSIS — R05 Cough: Secondary | ICD-10-CM | POA: Diagnosis not present

## 2019-08-11 DIAGNOSIS — M791 Myalgia, unspecified site: Secondary | ICD-10-CM | POA: Diagnosis not present

## 2019-08-21 DIAGNOSIS — Z9119 Patient's noncompliance with other medical treatment and regimen: Secondary | ICD-10-CM | POA: Diagnosis not present

## 2019-08-21 DIAGNOSIS — I10 Essential (primary) hypertension: Secondary | ICD-10-CM | POA: Diagnosis not present

## 2019-09-08 DIAGNOSIS — J310 Chronic rhinitis: Secondary | ICD-10-CM | POA: Diagnosis not present

## 2019-09-08 DIAGNOSIS — R519 Headache, unspecified: Secondary | ICD-10-CM | POA: Diagnosis not present

## 2019-09-22 DIAGNOSIS — J310 Chronic rhinitis: Secondary | ICD-10-CM | POA: Diagnosis not present

## 2019-09-22 DIAGNOSIS — R05 Cough: Secondary | ICD-10-CM | POA: Diagnosis not present

## 2019-10-06 DIAGNOSIS — J310 Chronic rhinitis: Secondary | ICD-10-CM | POA: Diagnosis not present

## 2019-10-06 DIAGNOSIS — R05 Cough: Secondary | ICD-10-CM | POA: Diagnosis not present

## 2019-10-13 DIAGNOSIS — R05 Cough: Secondary | ICD-10-CM | POA: Diagnosis not present

## 2019-10-13 DIAGNOSIS — J3489 Other specified disorders of nose and nasal sinuses: Secondary | ICD-10-CM | POA: Diagnosis not present

## 2019-11-28 DIAGNOSIS — J310 Chronic rhinitis: Secondary | ICD-10-CM | POA: Diagnosis not present

## 2019-11-28 DIAGNOSIS — R05 Cough: Secondary | ICD-10-CM | POA: Diagnosis not present

## 2020-01-02 DIAGNOSIS — F17210 Nicotine dependence, cigarettes, uncomplicated: Secondary | ICD-10-CM | POA: Diagnosis not present

## 2020-01-02 DIAGNOSIS — I10 Essential (primary) hypertension: Secondary | ICD-10-CM | POA: Diagnosis not present

## 2020-02-13 DIAGNOSIS — F17210 Nicotine dependence, cigarettes, uncomplicated: Secondary | ICD-10-CM | POA: Diagnosis not present

## 2020-02-13 DIAGNOSIS — I10 Essential (primary) hypertension: Secondary | ICD-10-CM | POA: Diagnosis not present

## 2020-03-19 DIAGNOSIS — F17210 Nicotine dependence, cigarettes, uncomplicated: Secondary | ICD-10-CM | POA: Diagnosis not present

## 2020-03-19 DIAGNOSIS — I10 Essential (primary) hypertension: Secondary | ICD-10-CM | POA: Diagnosis not present

## 2020-03-26 ENCOUNTER — Inpatient Hospital Stay: Payer: Medicare Other

## 2020-03-26 ENCOUNTER — Inpatient Hospital Stay
Admission: EM | Admit: 2020-03-26 | Discharge: 2020-03-30 | DRG: 871 | Disposition: A | Discharge status: Home / Self Care | Payer: Medicare Other | Attending: Obstetrics and Gynecology | Admitting: Obstetrics and Gynecology

## 2020-03-26 ENCOUNTER — Emergency Department: Payer: Medicare Other

## 2020-03-26 ENCOUNTER — Inpatient Hospital Stay (HOSPITAL_COMMUNITY)
Admit: 2020-03-26 | Discharge: 2020-03-26 | Disposition: A | Discharge status: Home / Self Care | Payer: Medicare Other | Attending: Internal Medicine | Admitting: Internal Medicine

## 2020-03-26 ENCOUNTER — Other Ambulatory Visit: Payer: Self-pay

## 2020-03-26 DIAGNOSIS — R000 Tachycardia, unspecified: Secondary | ICD-10-CM | POA: Diagnosis not present

## 2020-03-26 DIAGNOSIS — F200 Paranoid schizophrenia: Secondary | ICD-10-CM | POA: Diagnosis present

## 2020-03-26 DIAGNOSIS — J9811 Atelectasis: Secondary | ICD-10-CM | POA: Diagnosis not present

## 2020-03-26 DIAGNOSIS — I5021 Acute systolic (congestive) heart failure: Secondary | ICD-10-CM | POA: Diagnosis present

## 2020-03-26 DIAGNOSIS — F319 Bipolar disorder, unspecified: Secondary | ICD-10-CM | POA: Diagnosis present

## 2020-03-26 DIAGNOSIS — A419 Sepsis, unspecified organism: Principal | ICD-10-CM | POA: Diagnosis present

## 2020-03-26 DIAGNOSIS — R0602 Shortness of breath: Secondary | ICD-10-CM | POA: Diagnosis not present

## 2020-03-26 DIAGNOSIS — J189 Pneumonia, unspecified organism: Secondary | ICD-10-CM | POA: Diagnosis present

## 2020-03-26 DIAGNOSIS — Z79899 Other long term (current) drug therapy: Secondary | ICD-10-CM | POA: Diagnosis not present

## 2020-03-26 DIAGNOSIS — J219 Acute bronchiolitis, unspecified: Secondary | ICD-10-CM | POA: Diagnosis not present

## 2020-03-26 DIAGNOSIS — Z20822 Contact with and (suspected) exposure to covid-19: Secondary | ICD-10-CM | POA: Diagnosis present

## 2020-03-26 DIAGNOSIS — I10 Essential (primary) hypertension: Secondary | ICD-10-CM | POA: Diagnosis present

## 2020-03-26 DIAGNOSIS — J9601 Acute respiratory failure with hypoxia: Principal | ICD-10-CM | POA: Diagnosis present

## 2020-03-26 DIAGNOSIS — R911 Solitary pulmonary nodule: Secondary | ICD-10-CM | POA: Diagnosis present

## 2020-03-26 DIAGNOSIS — F17210 Nicotine dependence, cigarettes, uncomplicated: Secondary | ICD-10-CM | POA: Diagnosis present

## 2020-03-26 DIAGNOSIS — R0600 Dyspnea, unspecified: Secondary | ICD-10-CM

## 2020-03-26 DIAGNOSIS — I110 Hypertensive heart disease with heart failure: Secondary | ICD-10-CM | POA: Diagnosis not present

## 2020-03-26 DIAGNOSIS — J441 Chronic obstructive pulmonary disease with (acute) exacerbation: Secondary | ICD-10-CM | POA: Diagnosis present

## 2020-03-26 DIAGNOSIS — F17200 Nicotine dependence, unspecified, uncomplicated: Secondary | ICD-10-CM | POA: Diagnosis not present

## 2020-03-26 DIAGNOSIS — J9602 Acute respiratory failure with hypercapnia: Secondary | ICD-10-CM | POA: Diagnosis present

## 2020-03-26 DIAGNOSIS — Z743 Need for continuous supervision: Secondary | ICD-10-CM | POA: Diagnosis not present

## 2020-03-26 DIAGNOSIS — J449 Chronic obstructive pulmonary disease, unspecified: Secondary | ICD-10-CM

## 2020-03-26 DIAGNOSIS — J440 Chronic obstructive pulmonary disease with acute lower respiratory infection: Secondary | ICD-10-CM | POA: Diagnosis not present

## 2020-03-26 DIAGNOSIS — R059 Cough, unspecified: Secondary | ICD-10-CM | POA: Diagnosis not present

## 2020-03-26 LAB — CBC WITH DIFFERENTIAL/PLATELET
Abs Immature Granulocytes: 0.07 10*3/uL (ref 0.00–0.07)
Basophils Absolute: 0 10*3/uL (ref 0.0–0.1)
Basophils Relative: 0 %
Eosinophils Absolute: 0 10*3/uL (ref 0.0–0.5)
Eosinophils Relative: 0 %
HCT: 46.3 % (ref 39.0–52.0)
Hemoglobin: 15.2 g/dL (ref 13.0–17.0)
Immature Granulocytes: 0 %
Lymphocytes Relative: 5 %
Lymphs Abs: 0.9 10*3/uL (ref 0.7–4.0)
MCH: 30.3 pg (ref 26.0–34.0)
MCHC: 32.8 g/dL (ref 30.0–36.0)
MCV: 92.2 fL (ref 80.0–100.0)
Monocytes Absolute: 2 10*3/uL — ABNORMAL HIGH (ref 0.1–1.0)
Monocytes Relative: 11 %
Neutro Abs: 14.6 10*3/uL — ABNORMAL HIGH (ref 1.7–7.7)
Neutrophils Relative %: 84 %
Platelets: 234 10*3/uL (ref 150–400)
RBC: 5.02 MIL/uL (ref 4.22–5.81)
RDW: 12.4 % (ref 11.5–15.5)
WBC: 17.6 10*3/uL — ABNORMAL HIGH (ref 4.0–10.5)
nRBC: 0 % (ref 0.0–0.2)

## 2020-03-26 LAB — BLOOD GAS, ARTERIAL
Acid-Base Excess: 3.2 mmol/L — ABNORMAL HIGH (ref 0.0–2.0)
Acid-Base Excess: 6.8 mmol/L — ABNORMAL HIGH (ref 0.0–2.0)
Bicarbonate: 35.6 mmol/L — ABNORMAL HIGH (ref 20.0–28.0)
Bicarbonate: 33.4 mmol/L — ABNORMAL HIGH (ref 20.0–28.0)
Expiratory PAP: 5
FIO2: 0.36
FIO2: 40
Inspiratory PAP: 10
Mechanical Rate: 20
O2 Saturation: 89.1 %
O2 Saturation: 99.4 %
Patient temperature: 37
Patient temperature: 37
pCO2 arterial: 100 mmHg (ref 32.0–48.0)
pCO2 arterial: 54 mmHg — ABNORMAL HIGH (ref 32.0–48.0)
pH, Arterial: 7.16 — CL (ref 7.350–7.450)
pH, Arterial: 7.4 (ref 7.350–7.450)
pO2, Arterial: 72 mmHg — ABNORMAL LOW (ref 83.0–108.0)
pO2, Arterial: 156 mmHg — ABNORMAL HIGH (ref 83.0–108.0)

## 2020-03-26 LAB — PROCALCITONIN: Procalcitonin: <0.1 ng/mL

## 2020-03-26 LAB — COMPREHENSIVE METABOLIC PANEL
ALT: 20 U/L (ref 0–44)
AST: 25 U/L (ref 15–41)
Albumin: 3.8 g/dL (ref 3.5–5.0)
Alkaline Phosphatase: 80 U/L (ref 38–126)
Anion gap: 11 (ref 5–15)
BUN: 15 mg/dL (ref 6–20)
CO2: 33 mmol/L — ABNORMAL HIGH (ref 22–32)
Calcium: 9.3 mg/dL (ref 8.9–10.3)
Chloride: 92 mmol/L — ABNORMAL LOW (ref 98–111)
Creatinine, Ser: 0.8 mg/dL (ref 0.61–1.24)
GFR, Estimated: >60 mL/min (ref >60)
Glucose, Bld: 134 mg/dL — ABNORMAL HIGH (ref 70–99)
Potassium: 4 mmol/L (ref 3.5–5.1)
Sodium: 136 mmol/L (ref 135–145)
Total Bilirubin: 0.5 mg/dL (ref 0.3–1.2)
Total Protein: 7.6 g/dL (ref 6.5–8.1)

## 2020-03-26 LAB — LACTIC ACID, PLASMA: Lactic Acid, Venous: 1.6 mmol/L (ref 0.5–1.9)

## 2020-03-26 LAB — HIV ANTIBODY (ROUTINE TESTING W REFLEX): HIV Screen 4th Generation wRfx: NONREACTIVE

## 2020-03-26 LAB — RESPIRATORY PANEL BY RT PCR (FLU A&B, COVID)
Influenza A by PCR: NEGATIVE
Influenza B by PCR: NEGATIVE
SARS Coronavirus 2 by RT PCR: NEGATIVE

## 2020-03-26 LAB — BRAIN NATRIURETIC PEPTIDE: B Natriuretic Peptide: 174.4 pg/mL — ABNORMAL HIGH (ref 0.0–100.0)

## 2020-03-26 LAB — GLUCOSE, CAPILLARY: Glucose-Capillary: 137 mg/dL — ABNORMAL HIGH (ref 70–99)

## 2020-03-26 LAB — TROPONIN I (HIGH SENSITIVITY)
Troponin I (High Sensitivity): 7 ng/L (ref <18)
Troponin I (High Sensitivity): 8 ng/L (ref <18)

## 2020-03-26 LAB — STREP PNEUMONIAE URINARY ANTIGEN: Strep Pneumo Urinary Antigen: NEGATIVE

## 2020-03-26 MED ORDER — ONDANSETRON HCL 4 MG/2ML IJ SOLN: 4.0000 mg | Freq: Three times a day (TID) | INTRAMUSCULAR | Status: DC | PRN

## 2020-03-26 MED ORDER — SODIUM CHLORIDE 0.9 % IV BOLUS
1000.0000 mL | Freq: Once | INTRAVENOUS | Status: AC
  Administered 2020-03-26: 1000 mL via INTRAVENOUS

## 2020-03-26 MED ORDER — ALBUTEROL SULFATE (2.5 MG/3ML) 0.083% IN NEBU
2.5000 mg | INHALATION_SOLUTION | Freq: Once | RESPIRATORY_TRACT | Status: AC
  Administered 2020-03-26: 2.5 mg via RESPIRATORY_TRACT
  Filled 2020-03-26: qty 3

## 2020-03-26 MED ORDER — IOHEXOL 350 MG/ML SOLN
100.0000 mL | Freq: Once | INTRAVENOUS | Status: AC | PRN
  Administered 2020-03-26: 100 mL via INTRAVENOUS

## 2020-03-26 MED ORDER — SODIUM CHLORIDE 0.9 % IV SOLN
500.0000 mg | INTRAVENOUS | Status: DC
  Administered 2020-03-26 – 2020-03-28 (×3): 500 mg via INTRAVENOUS
  Filled 2020-03-26 (×3): qty 500

## 2020-03-26 MED ORDER — ENOXAPARIN SODIUM 40 MG/0.4ML ~~LOC~~ SOLN
40.0000 mg | SUBCUTANEOUS | Status: DC
  Administered 2020-03-26 – 2020-03-29 (×4): 40 mg via SUBCUTANEOUS
  Filled 2020-03-26 (×4): qty 0.4

## 2020-03-26 MED ORDER — SODIUM CHLORIDE 0.9 % IV BOLUS
500.0000 mL | Freq: Once | INTRAVENOUS | Status: AC
  Administered 2020-03-26: 500 mL via INTRAVENOUS

## 2020-03-26 MED ORDER — ACETAMINOPHEN 325 MG PO TABS: 650.0000 mg | ORAL_TABLET | Freq: Four times a day (QID) | ORAL | Status: DC | PRN

## 2020-03-26 MED ORDER — SODIUM CHLORIDE 0.9 % IV SOLN
2.0000 g | INTRAVENOUS | Status: DC
  Administered 2020-03-26 – 2020-03-29 (×4): 2 g via INTRAVENOUS
  Filled 2020-03-26: qty 2
  Filled 2020-03-26 (×3): qty 20
  Filled 2020-03-26: qty 2

## 2020-03-26 MED ORDER — FUROSEMIDE 10 MG/ML IJ SOLN
20.0000 mg | Freq: Once | INTRAMUSCULAR | Status: AC
  Administered 2020-03-26: 20 mg via INTRAVENOUS
  Filled 2020-03-26: qty 4

## 2020-03-26 MED ORDER — HYDRALAZINE HCL 20 MG/ML IJ SOLN: 5.0000 mg | INTRAMUSCULAR | Status: DC | PRN

## 2020-03-26 MED ORDER — BUDESONIDE 0.5 MG/2ML IN SUSP
0.5000 mg | Freq: Two times a day (BID) | RESPIRATORY_TRACT | Status: DC
  Administered 2020-03-26 – 2020-03-30 (×7): 0.5 mg via RESPIRATORY_TRACT
  Filled 2020-03-26 (×8): qty 2

## 2020-03-26 MED ORDER — LORAZEPAM 2 MG/ML IJ SOLN
0.5000 mg | Freq: Three times a day (TID) | INTRAMUSCULAR | Status: DC | PRN
  Administered 2020-03-26: 0.5 mg via INTRAVENOUS
  Filled 2020-03-26: qty 1

## 2020-03-26 MED ORDER — IPRATROPIUM-ALBUTEROL 0.5-2.5 (3) MG/3ML IN SOLN
3.0000 mL | RESPIRATORY_TRACT | Status: DC
  Administered 2020-03-26 – 2020-03-28 (×10): 3 mL via RESPIRATORY_TRACT
  Filled 2020-03-26 (×10): qty 3

## 2020-03-26 MED ORDER — METHYLPREDNISOLONE SODIUM SUCC 40 MG IJ SOLR
40.0000 mg | Freq: Two times a day (BID) | INTRAMUSCULAR | Status: DC
  Administered 2020-03-26 – 2020-03-28 (×4): 40 mg via INTRAVENOUS
  Filled 2020-03-26 (×4): qty 1

## 2020-03-26 MED ORDER — NICOTINE 21 MG/24HR TD PT24
21.0000 mg | MEDICATED_PATCH | Freq: Every day | TRANSDERMAL | Status: DC
  Filled 2020-03-26 (×2): qty 1

## 2020-03-26 MED ORDER — ALBUTEROL SULFATE (2.5 MG/3ML) 0.083% IN NEBU
2.5000 mg | INHALATION_SOLUTION | RESPIRATORY_TRACT | Status: DC | PRN
  Administered 2020-03-26: 2.5 mg via RESPIRATORY_TRACT
  Filled 2020-03-26 (×2): qty 3

## 2020-03-26 MED ORDER — DM-GUAIFENESIN ER 30-600 MG PO TB12: 1.0000 | ORAL_TABLET | Freq: Two times a day (BID) | ORAL | Status: DC | PRN

## 2020-03-26 NOTE — ED Notes (Signed)
Another RN noted patient calling out for help with restroom. Patient had removed O2 [place] with desats to 65% on RA. Patient tachypneic and diaphoretic. Patient back to bed on 6L [place] to recover. Patient incontinent of urine. Gown and linens changed. Patient recovered well, O2 titrated down, lunch provided. Patient instructed to use call light for further needs.

## 2020-03-26 NOTE — ED Triage Notes (Signed)
Pt to ED via [facility] from [facility] for chief complaint of SHOB/productive cough since Sunday. Ems reports RA SpO2 60's with improvement to 100% on NRB.  20G to R [facility], 125mg  Solumedrol and duoneb given EMS enroute Reports testing neg for covid 1 month ago.  Denies chills, fever, V/D.  Pt speaking in complete sentences.  Denies CP, SHOB at this moment.  Reports dizziness.  Labored breathing noted.

## 2020-03-26 NOTE — Progress Notes (Signed)
Adjustments made to bipap setting appx hour ago once got patient back on.  Patient appeared at the time to understand the importance of bipap per my conversation.  He has since taken off but allowed me to put back on.  ABG better. Will continue to monitor

## 2020-03-26 NOTE — Progress Notes (Signed)
Cross Cover Note Patient admitted with shortness of breath/acute respiratory failure requiring BIPAP. Initial chest xray without acute findings. Initial ABG with respiratory acidosis pH7.16 and CO2 of 100.  Procalcitonin <0.10 Review of past medical record shows multiple outpatient appointments for reported cough.  Past CXR in 2011 shows emphysematous changes, but no official COPD diagnosis found.  Patient with smoking history Repeat ABG  Much improved with PH 7.40 and CO2 now 54 on 40% 10/5 on bipap. O2 156 - suggest weanin off bipap after CT chest done.  Oxygen saturation goal 90% as suspect hypercapnia is acute on chronic

## 2020-03-26 NOTE — Consult Note (Signed)
CODE SEPSIS - PHARMACY COMMUNICATION  **Broad Spectrum Antibiotics should be administered within 1 hour of Sepsis diagnosis**  Time Code Sepsis Called/Page Received: 1208  Antibiotics Ordered:  Ceftriaxone 2g IV q24h Azithromycin 500mg  q24h  Time of 1st antibiotic administration: 1202  Additional action taken by pharmacy: N/A  If necessary, Name of Provider/Nurse Contacted: N/A   Sherilyn Banker, PharmD Pharmacy Resident  03/26/2020 12:15 PM

## 2020-03-26 NOTE — H&P (Addendum)
History and Physical    Kenneth [name]. ZDG:[id number] DOB: 1960/11/07 DOA: 03/26/2020  Referring MD/NP/PA:   PCP: Patient, No Pcp Per   Patient coming from:  The patient is coming from group home.  At baseline, pt is partially dependent for most of ADL.        Chief Complaint: SOB and cough  HPI: Jonavon Trieu. is a 59 y.o. male with medical history significant of hypertension, bipolar, paranoid schizophrenia, tobacco abuse, who presents with cough and shortness breath.  P has been having shortness of breath for more than 3 days, which gradually started and has been progressively worsening.  Patient has productive cough with yellow-colored sputum production. The patient states that he felt like he had a viral upper respiratory syndrome earlier in the weekend, and then developed the more severe shortness of breath in the last few days.  Patient denies chest pain, fever or chills.  Patient does not have nausea, vomiting, diarrhea, abdominal, symptoms of UTI or unilateral weakness.  Patient is calm.  No suicidal or homicidal ideations. He states he has been vaccinated for Covid.   Per report, patient oxygen desaturated to 60s on room air, patient was put on nonrebreather. Pt was given Solu-Medrol and DuoNeb nebulizers by EMS.  Oxygen saturation improved to 91-94% on 3 L oxygen in ED.   ED Course: pt was found to have WBC 17.6, negative COVID-19 PCR, lactic acid 1.6, electrolytes renal function okay, temperature normal, blood pressure 143/77, tachycardia with heart rate of 104, tachypnea with RR 34. I have personally reviewed the chest x-ray, which showed prominent interstitial marker in the left lung base with possible small left pleural effusion. Patient is admitted to [facility] bed as inpatient.  CXR showed: 1. Increased interstitial markings particularly in the LEFT lung base with small LEFT effusion versus pleural-parenchymal scarring. Findings could represent atypical infection  versus asymmetric edema. 2. Small effusion versus pleural and parenchymal scarring at the LEFT lung base. 3. Fractures of ribs 7 through 10 on the LEFT posteriorly appear to show callus formation likely subacute or chronic.    Review of Systems:   General: no fevers, chills, no body weight gain, has fatigue HEENT: no blurry vision, hearing changes or sore throat Respiratory: has dyspnea, coughing, wheezing CV: no chest pain, no palpitations GI: no nausea, vomiting, abdominal pain, diarrhea, constipation GU: no dysuria, burning on urination, increased urinary frequency, hematuria  Ext: no leg edema Neuro: no unilateral weakness, numbness, or tingling, no vision change or hearing loss Skin: no rash, no skin tear. MSK: No muscle spasm, no deformity, no limitation of range of movement in spin Heme: No easy bruising.  Travel history: No recent long distant travel. Psychiatry: No suicidal homicidal ideations.  Patient is calm   Allergy: No Known Allergies  Past Medical History:  Diagnosis Date  . Bipolar 1 disorder ([place])   . Hypertension   . Paranoid schizophrenia ([place])     No past surgical history on file.  Social History:  reports that he has been smoking cigarettes. He has never used smokeless tobacco. He reports previous alcohol use. He reports that he does not use drugs.  Family History: Reviewed with patient exam.  Patient states that all family members are healthy, patient dose not seem know the detail information about family medical history.  Prior to Admission medications   Medication Sig Start Date End Date Taking? Authorizing Provider  clonazePAM (KLONOPIN) 0.5 MG tablet Take 0.5 mg by mouth 2 (  two) times daily.    [provider]  divalproex (DEPAKOTE) 500 MG DR tablet Take 500 mg by mouth 3 (three) times daily.    [provider]  docusate sodium (COLACE) 100 MG capsule Take 100 mg by mouth 2 (two) times daily.    [provider]   metoprolol tartrate (LOPRESSOR) 25 MG tablet Take 25 mg by mouth 2 (two) times daily.    [provider]  OLANZapine (ZYPREXA) 15 MG tablet Take 15 mg by mouth 2 (two) times daily.    [provider]  paliperidone (INVEGA SUSTENNA) 234 MG/1.5ML SUSY injection Inject 234 mg into the muscle every 28 (twenty-eight) days. 04/15/18   Pucilowska, Jolanta B, MD  temazepam (RESTORIL) 30 MG capsule Take 1 capsule (30 mg total) by mouth at bedtime. 03/23/18   Pucilowska, Herma Ard B, MD  traZODone (DESYREL) 100 MG tablet Take 100 mg by mouth at bedtime.    [provider]    Physical Exam: Vitals:   03/26/20 1030 03/26/20 1130 03/26/20 1230 03/26/20 1300  BP: (!) 143/77 138/81 138/85 139/81  Pulse: 96 (!) 103 96 94  Resp: (!) 31 (!) 33 (!) 27 (!) 28  Temp:      TempSrc:      SpO2: 94% 95% 95% 91%  Weight:      Height:       General: Not in acute distress HEENT:       Eyes: PERRL, EOMI, no scleral icterus.       ENT: No discharge from the ears and nose, no pharynx injection, no tonsillar enlargement.        Neck: No JVD, no bruit, no mass felt. Heme: No neck lymph node enlargement. Cardiac: S1/S2, RRR, No murmurs, No gallops or rubs. Respiratory: has coarse breathing sound and wheezing bilaterally GI: Soft, nondistended, nontender, no rebound pain, no organomegaly, BS present. GU: No hematuria Ext: No pitting leg edema bilaterally. 2+DP/PT pulse bilaterally. Musculoskeletal: No joint deformities, No joint redness or warmth, no limitation of ROM in spin. Skin: No rashes.  Neuro: Alert, oriented X3, cranial nerves II-XII grossly intact, moves all extremities normally. Psych: Patient is not psychotic, no suicidal or hemocidal ideation.  Labs on Admission: I have personally reviewed following labs and imaging studies  CBC: Recent Labs  Lab 03/26/20 1006  WBC 17.6*  NEUTROABS 14.6*  HGB 15.2  HCT 46.3  MCV 92.2  PLT 539   Basic Metabolic Panel: Recent Labs   Lab 03/26/20 1006  NA 136  K 4.0  CL 92*  CO2 33*  GLUCOSE 134*  BUN 15  CREATININE 0.80  CALCIUM 9.3   GFR: Estimated Creatinine Clearance: 110.5 mL/min (by C-G formula based on SCr of 0.8 mg/dL). Liver Function Tests: Recent Labs  Lab 03/26/20 1006  AST 25  ALT 20  ALKPHOS 80  BILITOT 0.5  PROT 7.6  ALBUMIN 3.8   No results for input(s): LIPASE, AMYLASE in the last 168 hours. No results for input(s): AMMONIA in the last 168 hours. Coagulation Profile: No results for input(s): INR, PROTIME in the last 168 hours. Cardiac Enzymes: No results for input(s): CKTOTAL, CKMB, CKMBINDEX, TROPONINI in the last 168 hours. BNP (last 3 results) No results for input(s): PROBNP in the last 8760 hours. HbA1C: No results for input(s): HGBA1C in the last 72 hours. CBG: No results for input(s): GLUCAP in the last 168 hours. Lipid Profile: No results for input(s): CHOL, HDL, LDLCALC, TRIG, CHOLHDL, LDLDIRECT in the last 72 hours.  Thyroid Function Tests: No results for input(s): TSH, T4TOTAL, FREET4, T3FREE, THYROIDAB in the last 72 hours. Anemia Panel: No results for input(s): VITAMINB12, FOLATE, FERRITIN, TIBC, IRON, RETICCTPCT in the last 72 hours. Urine analysis:    Component Value Date/Time   COLORURINE AMBER (A) 03/17/2018 0641   APPEARANCEUR CLEAR (A) 03/17/2018 0641   APPEARANCEUR Hazy 05/13/2013 1142   LABSPEC 1.027 03/17/2018 0641   LABSPEC 1.017 05/13/2013 1142   PHURINE 6.0 03/17/2018 0641   GLUCOSEU NEGATIVE 03/17/2018 0641   GLUCOSEU Negative 05/13/2013 1142   HGBUR NEGATIVE 03/17/2018 0641   BILIRUBINUR NEGATIVE 03/17/2018 0641   BILIRUBINUR Negative 05/13/2013 1142   KETONESUR NEGATIVE 03/17/2018 0641   PROTEINUR 30 (A) 03/17/2018 0641   NITRITE NEGATIVE 03/17/2018 0641   LEUKOCYTESUR NEGATIVE 03/17/2018 0641   LEUKOCYTESUR Negative 05/13/2013 1142   Sepsis Labs: @LABRCNTIP (procalcitonin:4,lacticidven:4) ) Recent Results (from the past 240 hour(s))   Respiratory Panel by RT PCR (Flu A&B, Covid) - Nasopharyngeal Swab     Status: None   Collection Time: 03/26/20 10:06 AM   Specimen: Nasopharyngeal Swab  Result Value Ref Range Status   SARS Coronavirus 2 by RT PCR NEGATIVE NEGATIVE Final    Comment: (NOTE) SARS-CoV-2 target nucleic acids are NOT DETECTED.  The SARS-CoV-2 RNA is generally detectable in upper respiratoy specimens during the acute phase of infection. The lowest concentration of SARS-CoV-2 viral copies this assay can detect is 131 copies/mL. A negative result does not preclude SARS-Cov-2 infection and should not be used as the sole basis for treatment or other patient management decisions. A negative result may occur with  improper specimen collection/handling, submission of specimen other than nasopharyngeal swab, presence of viral mutation(s) within the areas targeted by this assay, and inadequate number of viral copies (<131 copies/mL). A negative result must be combined with clinical observations, patient history, and epidemiological information. The expected result is Negative.  Fact Sheet for Patients:  PinkCheek.be  Fact Sheet for Healthcare Providers:  GravelBags.it  This test is no t yet approved or cleared by the [place] FDA and  has been authorized for detection and/or diagnosis of SARS-CoV-2 by FDA under an Emergency Use Authorization (EUA). This EUA will remain  in effect (meaning this test can be used) for the duration of the COVID-19 declaration under Section 564(b)(1) of the Act, 21 U.S.C. section 360bbb-3(b)(1), unless the authorization is terminated or revoked sooner.     Influenza A by PCR NEGATIVE NEGATIVE Final   Influenza B by PCR NEGATIVE NEGATIVE Final    Comment: (NOTE) The Xpert Xpress SARS-CoV-2/FLU/RSV assay is intended as an aid in  the diagnosis of influenza from Nasopharyngeal swab specimens and  should not be used as  a sole basis for treatment. Nasal washings and  aspirates are unacceptable for Xpert Xpress SARS-CoV-2/FLU/RSV  testing.  Fact Sheet for Patients: PinkCheek.be  Fact Sheet for Healthcare Providers: GravelBags.it  This test is not yet approved or cleared by the [place] FDA and  has been authorized for detection and/or diagnosis of SARS-CoV-2 by  FDA under an Emergency Use Authorization (EUA). This EUA will remain  in effect (meaning this test can be used) for the duration of the  Covid-19 declaration under Section 564(b)(1) of the Act, 21  U.S.C. section 360bbb-3(b)(1), unless the authorization is  terminated or revoked. Performed at [facility], [address] [place], [place] [address]      Radiological Exams on Admission: DG Chest Portable 1 View  Result Date: 03/26/2020 CLINICAL  DATA:  Shortness of breath, productive cough since Sunday EXAM: PORTABLE CHEST 1 VIEW COMPARISON:  Examination from 20/12 FINDINGS: Trachea midline.  Cardiomediastinal contours are normal. No lobar consolidation. Increased interstitial markings particularly in the LEFT lung base. Suggestion of LEFT-sided effusion versus pleural-parenchymal scarring Fractures of ribs 7 through 10 on the LEFT posteriorly appear to show callus formation likely subacute or chronic. These were not present on the prior study. IMPRESSION: 1. Increased interstitial markings particularly in the LEFT lung base with small LEFT effusion versus pleural-parenchymal scarring. Findings could represent atypical infection versus asymmetric edema. 2. Small effusion versus pleural and parenchymal scarring at the LEFT lung base. 3. Fractures of ribs 7 through 10 on the LEFT posteriorly appear to show callus formation likely subacute or chronic. Correlate with any trauma history and any pain in this location, findings are new since 2012 without recent comparison. Electronically  Signed   By: Zetta Bills M.D.   On: 03/26/2020 10:30     EKG: I have personally reviewed.  Sinus rhythm, QTC 469, LAD, poor R wave progression   Assessment/Plan Principal Problem:   CAP (community acquired pneumonia) Active Problems:   Tobacco use disorder   Paranoid schizophrenia ([place])   Bipolar 1 disorder ([name])   Hypertension   Sepsis ([place])   Acute respiratory failure with hypoxia ([name])   Sepsis and acute respiratory failure with hypoxia due to possible CAP: Patient has productive cough, shortness of breath, chest x-ray showed prominent interstitial marking, though no obvious consolidation, clinically patient seems to have pneumonia.  Patient is smoker, currently smokes 3 cigarettes/day.  He has wheezing on auscultation, indicating possible undiagnosed COPD with exacerbation.  Patient meets criteria for sepsis with leukocytosis, tachycardia and tachypnea.  Lactic acid is normal.  Currently hemodynamically stable.  - Will admit to med-surg bed as inpt - IV Rocephin and azithromycin - Mucinex for cough  - Bronchodilators - Solu-Medrol 40 mg twice daily - Urine legionella and S. pneumococcal antigen - Follow up blood culture x2, sputum culture - will get Procalcitonin - IVF: total of 2.5L NS bolus  Addendum: In late afternoon, patient developed worsening shortness of breath.  Auscultation showed diffuse crackles, mild wheezing and decreased air movement.  This happened after the patient received 2.5 L normal saline.  ABG with pH of 7.16, PCO2 100, PO2 72 -Will give 20 mg Lasix by IV -Start BiPAP -change bed to SDU -Check BNP - get 2d echo -CAT to r/o PE -Consulted Dr. Mortimer Fries of ICU    Paranoid schizophrenia and Bipolar 1 disorder: Patient is calm.  No suicidal or homicidal ideations. -Patient is receiving Invega q28 days, last dose was on 03/22/2020  Tobacco use disorder -nicotine patch  Hypertension: Blood pressure 143/77.  Patient is not taking medications  currently. -Hydralazine as needed.           DVT ppx:  SQ Lovenox Code Status: Full code Family Communication: not done, no family member is at bed side.   I have called his sister twice without success, I left message. Disposition Plan:  Anticipate discharge back to previous group home environment Consults called:   Admission status: Med-surg bed as inpt     Status is: Inpatient  Remains inpatient appropriate because:Inpatient level of care appropriate due to severity of illness.  Patient has multiple comorbidities, now presents with sepsis acute respiratory failure with hypoxia due to possible community-acquired pneumonia.  His presentation is highly complicated.  Patient is at high risk of deteriorating.  Patient  will need to be treated in hospital for the least 2 days.   Dispo: The patient is from: Group home              Anticipated d/c is to: Group home              Anticipated d/c date is: 2 days              Patient currently is not medically stable to d/c.         Date of Service 03/26/2020    Ivor Costa [facility]   If 7PM-7AM, please contact night-coverage [email or web address] 03/26/2020, 1:30 PM

## 2020-03-26 NOTE — Consult Note (Signed)
Name: Kenneth [name]. MRN: [id number] DOB: Jan 02, 1961    ADMISSION DATE:  03/26/2020 CONSULTATION DATE:  03/26/2020  REFERRING MD :  Dr. Blaine Hamper  CHIEF COMPLAINT:  Acute Respiratory Distress, AMS  BRIEF PATIENT DESCRIPTION:  59 y.o. Male admitted with Sepsis and Acute Hypoxic Hypercapnic Respiratory Failure in the setting of suspected Community Acquired Pneumonia vs. Pulmonary Edema, along with suspected undiagnosed COPD requiring BiPAP.  SIGNIFICANT EVENTS  10/12: Presented to ED; to be admitted to [place] unit by Hospitalist 10/12: While in ED following IVF resuscitation, developed Acute Respiratory Distress and Somnolence, placed on BiPAP with PCCM consultation   STUDIES:  10/12: CXR>>1. Increased interstitial markings particularly in the LEFT lung base with small LEFT effusion versus pleural-parenchymal scarring. Findings could represent atypical infection versus asymmetric edema. 2. Small effusion versus pleural and parenchymal scarring at the LEFT lung base. 3. Fractures of ribs 7 through 10 on the LEFT posteriorly appear to show callus formation likely subacute or chronic. Correlate with any trauma history and any pain in this location, findings are new since 2012 without recent comparison. 10/12: 2D Echocardiogram>> 10/12: CTA Chest>>1. Bronchiolitis. Indeterminate etiology, possibly infectious or inflammatory. Consider trial therapy with follow-up HRCT as outpatient for follow-up. 2. A 1 x 1.4 cm solid pulmonary nodule. Recommend repeat CT in 3 months to evaluate stability. 3. Indeterminate right hilar lymphadenopathy that is likely reactive. Attention on follow-up. 4. No pulmonary embolus.  CULTURES: 10/12: SARS-CoV-2>> negative 10/12: Influenza PCR>> negative 10/12: Blood culture x2>> 10/12: Sputum >> 10/12: Strep pneumo urinary antigen>> 10/12: Legionella urinary antigen>>  ANTIBIOTICS: Azithromycin 10/12>> Ceftriaxone 10/12>>  HISTORY OF PRESENT  ILLNESS:   Kenneth [name] is a 59 y.o. Male with a past medical history significant for hypertension, bipolar, paranoid schizophrenia, tobacco abuse who presented to [facility] ED on 03/26/2020 from his group home due to progressive shortness of breath and cough.  He has been having shortness of breath for approximately 3 days which has progressed, along with productive cough with yellow colored sputum.  He denied chest pain, fever, chills, nausea, vomiting, diarrhea, abdominal pain, dysuria.  He also reports he has been vaccinated for COVID.  On room air he was found to be hypoxic with O2 saturations in the 60's.  EMS gave Solu-Medrol and DuoNebs.  Upon presentation to the ED he was noted to be tachycardic with HR 104, BP 143/77, tachypnea with RR 34.  Initial workup revealed WBC 17.6 with Neutrophilia, lactic acid 1.6, procalcitonin less than 0.10, high sensitivity troponin 7, and BNP 174. He was noted to have wheezing upon auscultation, and notes smoking 3 cigarettes/day.  Chest x-ray with increased interstitial markings, especially in the left lung base with small left pleural effusion (concerning for atypical infection vs. Pulmonary edema). His COVID-19 PCR is negative, and influenza PCR is negative.  He was to be admitted to the Med-Surg unit by the Hospitalist for further workup and treatment of Sepsis and Acute Hypoxic Respiratory Failure in the setting of suspected Community Acquired Pneumonia.  While awaiting bed placement in the ED, he was also given 2.5 L of Normal Saline per sepsis protocol.  Following IVF resuscitation, he developed acute respiratory distress with diffuse crackles.  ABG with pH 7.16 / pCO2 100 / pO2 72 / Bicarb 35.6.  He was placed on BiPAP and given 20 mg IV Lasix.  CTA Chest is currently pending.  His admission status was changed to Stepdown, and PCCM was consulted for further assistance in management of Acute Hypoxic Hypercapnic  Respiratory Failure requiring BiPAP.   PAST  MEDICAL HISTORY :   has a past medical history of Bipolar 1 disorder ([place]), Hypertension, and Paranoid schizophrenia ([place]).  has no past surgical history on file. Prior to Admission medications   Medication Sig Start Date End Date Taking? Authorizing Provider  paliperidone (INVEGA SUSTENNA) 234 MG/1.5ML SUSY injection Inject 234 mg into the muscle every 28 (twenty-eight) days. 04/15/18  Yes Pucilowska, Jolanta B, MD   No Known Allergies  FAMILY HISTORY:  family history is not on file. SOCIAL HISTORY:  reports that he has been smoking cigarettes. He has never used smokeless tobacco. He reports previous alcohol use. He reports that he does not use drugs.   COVID-19 DISASTER DECLARATION:  FULL CONTACT PHYSICAL EXAMINATION WAS NOT POSSIBLE DUE TO TREATMENT OF COVID-19 AND  CONSERVATION OF PERSONAL PROTECTIVE EQUIPMENT, LIMITED EXAM FINDINGS INCLUDE-  Patient assessed or the symptoms described in the history of present illness.  In the context of the Global COVID-19 pandemic, which necessitated consideration that the patient might be at risk for infection with the SARS-CoV-2 virus that causes COVID-19, Institutional protocols and algorithms that pertain to the evaluation of patients at risk for COVID-19 are in a state of rapid change based on information released by regulatory bodies including the CDC and federal and state organizations. These policies and algorithms were followed during the patient's care while in hospital.  REVIEW OF SYSTEMS:  Positives In BOLD Constitutional: Negative for fever, chills, weight loss, malaise/fatigue and diaphoresis.  HENT: Negative for hearing loss, ear pain, nosebleeds, congestion, sore throat, neck pain, tinnitus and ear discharge.   Eyes: Negative for blurred vision, double vision, photophobia, pain, discharge and redness.  Respiratory: Negative for +cough, hemoptysis, +sputum production, +shortness of breath, +wheezing and stridor.   Cardiovascular:  Negative for chest pain, palpitations, orthopnea, claudication, leg swelling and PND.  Gastrointestinal: Negative for heartburn, nausea, vomiting, abdominal pain, diarrhea, constipation, blood in stool and melena.  Genitourinary: Negative for dysuria, urgency, frequency, hematuria and flank pain.  Musculoskeletal: Negative for myalgias, back pain, joint pain and falls.  Skin: Negative for itching and rash.  Neurological: Negative for dizziness, tingling, tremors, sensory change, speech change, focal weakness, seizures, loss of consciousness, weakness and headaches.  Endo/Heme/Allergies: Negative for environmental allergies and polydipsia. Does not bruise/bleed easily.  SUBJECTIVE:  Pt reports SOB (improved with BiPAP), along with productive cough Denies chest pain, fever, chills, abdominal pain, N/V/D On 40% FiO2 on BiPAP, 10/5 Requesting sips of water since his mouth is dry  VITAL SIGNS: Temp:  [97.8 F (36.6 C)-98.4 F (36.9 C)] 97.8 F (36.6 C) (10/12 1717) Pulse Rate:  [72-107] 101 (10/12 1918) Resp:  [25-41] 29 (10/12 1918) BP: (116-151)/(72-93) 140/88 (10/12 1830) SpO2:  [66 %-100 %] 98 % (10/12 1918) Weight:  [77.6 kg] 77.6 kg (10/12 1002)  PHYSICAL EXAMINATION: General:  Acutely ill appearing male, sitting in bed, on BiPAP, in NAD Neuro:  Sleeping, arouses to voice, A&O x4, follows commands, no focal deficits, speech clear HEENT:  Atraumatic, normocephalic, neck supple, no JVD Cardiovascular: Tachycardia, regular rhythm, s1s2, no M/R/G, 1+ distal pulses Lungs:  Coarse breath sounds bilaterally with expiratory wheezing to LLL and RLL, BiPAP assisted, even, no assessory muscle use Abdomen:  Soft, nontender, nondistended, no guarding or rebound tenderness, BS+ x4 Musculoskeletal:  Normal bulk and tone, no deformities, no edema Skin:  Warm and dry.  No obvious rashes, lesions, or ulcerations  Recent Labs  Lab 03/26/20 1006  NA 136  K  4.0  CL 92*  CO2 33*  BUN 15   CREATININE 0.80  GLUCOSE 134*   Recent Labs  Lab 03/26/20 1006  HGB 15.2  HCT 46.3  WBC 17.6*  PLT 234   DG Chest Portable 1 View  Result Date: 03/26/2020 CLINICAL DATA:  Shortness of breath, productive cough since Sunday EXAM: PORTABLE CHEST 1 VIEW COMPARISON:  Examination from 20/12 FINDINGS: Trachea midline.  Cardiomediastinal contours are normal. No lobar consolidation. Increased interstitial markings particularly in the LEFT lung base. Suggestion of LEFT-sided effusion versus pleural-parenchymal scarring Fractures of ribs 7 through 10 on the LEFT posteriorly appear to show callus formation likely subacute or chronic. These were not present on the prior study. IMPRESSION: 1. Increased interstitial markings particularly in the LEFT lung base with small LEFT effusion versus pleural-parenchymal scarring. Findings could represent atypical infection versus asymmetric edema. 2. Small effusion versus pleural and parenchymal scarring at the LEFT lung base. 3. Fractures of ribs 7 through 10 on the LEFT posteriorly appear to show callus formation likely subacute or chronic. Correlate with any trauma history and any pain in this location, findings are new since 2012 without recent comparison. Electronically Signed   By: Zetta Bills M.D.   On: 03/26/2020 10:30    ASSESSMENT / PLAN:  Acute Hypoxic Hypercapnic Respiratory Failure in the setting of suspected Community Acquired Pneumonia vs. Pulmonary Edema Suspect Undiagnosed COPD Para-mediastinal Pulmonary Nodule -Supplemental O2 as needed to maintain O2 sats >92% -BiPAP, wean as tolerated -High risk for intubation -Follow intermittent CXR & ABG as needed -CTA Chest on 04/04/20 negative for PE, was concerning for Bronchiolitis and a 1 x 1.4 cm solid pulmonary nodule  -Received 20 mg IV Lasix 03/26/20 -Bronchodilators -Continue IV Steroids, add Nebulized Steroids -Continue Azithromycin & Rocephin -Repeat CT Chest in 3 months for follow up on  pulmonary nodule   Sepsis secondary to suspected CAP -Monitor fever curve -Trend WBC's & Procalcitonin -Follow Cultures as above -Continue Azithromycin & Rocephin for now          Pt's prognosis is guarded.  He is high risk for intubation.  BEST PRACTICES DISPOSITION: Stepdown GOALS OF CARE: Full Code VTE PROPHYLAXIS: SQ Lovenox CONSULTS: Hospitalist (primary service) UPDATES: Updated pt at bedside 03/26/20  Darel Hong, AGACNP-BC [facility] Pager: [phone]  03/26/2020, 7:46 PM

## 2020-03-26 NOTE — Progress Notes (Signed)
RT to patient bedside for scheduled breathing treatment. Patient agitated and pulling BiPap mask off. Bipap mask adjusted to patient comfort but was again removed by patient shortly afterwards. Patient requesting to take mask off. Patient placed on 4L [place] at this time. Scheduled breathing treatments given. Will continue to monitor. Bipap on standby in patient room.

## 2020-03-26 NOTE — ED Notes (Signed)
This RN walked into pt's room at this time, and noticed pt removed bipap mask. Respiratory at bedside placing mask back on pt at this time.

## 2020-03-26 NOTE — ED Notes (Signed)
Pt transported to [place] with nurse.

## 2020-03-26 NOTE — ED Notes (Signed)
Respiratory to place pt back on bipap at this time.

## 2020-03-26 NOTE — ED Provider Notes (Signed)
[name] Emergency Department Provider Note ____________________________________________   First MD Initiated Contact with Patient 03/26/20 1001     (approximate)  I have reviewed the triage vital signs and the nursing notes.   HISTORY  Chief Complaint Shortness of Breath    HPI Kenneth [name]. is a 59 y.o. male with PMH as noted below who presents with shortness of breath over the last 3 days, gradual onset, worsening course, associated with cough productive of yellow sputum.  The patient states that he felt like he had a viral upper respiratory syndrome earlier in the weekend, and then developed the more severe shortness of breath in the last few days.  He denies any history of lung problems.  He states he has been vaccinated for Covid.  He lives in a group home.  Per EMS, O2 saturation was as low as the sixties on their arrival.  He was placed on a nonrebreather, given duo nebs and Solu-Medrol with improvement.  Past Medical History:  Diagnosis Date  . Bipolar 1 disorder ([place])   . Hypertension   . Paranoid schizophrenia [facility])     Patient Active Problem List   Diagnosis Date Noted  . Acute respiratory failure with hypoxia ([place]) 03/26/2020  . Paranoid schizophrenia ([place])   . Bipolar 1 disorder ([place])   . Hypertension   . CAP (community acquired pneumonia)   . Sepsis ([place])   . Schizophrenia, undifferentiated ([place]) 03/16/2018  . Noncompliance 03/16/2018  . Tobacco use disorder 03/16/2018    No past surgical history on file.  Prior to Admission medications   Medication Sig Start Date End Date Taking? Authorizing Provider  paliperidone (INVEGA SUSTENNA) 234 MG/1.5ML SUSY injection Inject 234 mg into the muscle every 28 (twenty-eight) days. 04/15/18  Yes Pucilowska, Wardell [place], MD    Allergies Patient has no known allergies.  No family history on file.  Social History Social History   Tobacco Use  . Smoking status: Current Every Day  Smoker    Types: Cigarettes  . Smokeless tobacco: Never Used  Substance Use Topics  . Alcohol use: Not Currently  . Drug use: Never    Review of Systems  Constitutional: No fever. Eyes: No redness. ENT: No sore throat. Cardiovascular: Denies chest pain. Respiratory: Positive for shortness of breath. Gastrointestinal: No vomiting or diarrhea.  Genitourinary: Negative for flank pain.  Musculoskeletal: Negative for back pain. Skin: Negative for rash. Neurological: Negative for headache.   ____________________________________________   PHYSICAL EXAM:  VITAL SIGNS: ED Triage Vitals  Enc Vitals Group     BP 03/26/20 1001 (!) 151/93     Pulse Rate 03/26/20 1001 (!) 104     Resp 03/26/20 1001 (!) 30     Temp 03/26/20 1001 98.4 F (36.9 C)     Temp Source 03/26/20 1001 Oral     SpO2 03/26/20 1001 100 %     Weight 03/26/20 1002 171 lb (77.6 kg)     Height 03/26/20 1002 6' (1.829 m)     Head Circumference --      Peak Flow --      Pain Score 03/26/20 1001 0     Pain Loc --      Pain Edu? --      Excl. in [place]? --     Constitutional: Alert and oriented.  Relatively well appearing with increased work of breathing but no acute distress. Eyes: Conjunctivae are normal.  Head: Atraumatic. Nose: No congestion/rhinnorhea. Mouth/Throat: Mucous membranes  are moist.   Neck: Normal range of motion.  Cardiovascular: Tachycardic, regular rhythm. Grossly normal heart sounds.  Good peripheral circulation. Respiratory: Increased respiratory effort.  Diffuse wheezing bilaterally. Gastrointestinal: No distention.  Musculoskeletal: No lower extremity edema.  Extremities warm and well perfused.  Neurologic:  Normal speech and language. No gross focal neurologic deficits are appreciated.  Skin:  Skin is warm and dry. No rash noted. Psychiatric: Mood and affect are normal. Speech and behavior are normal.  ____________________________________________   LABS (all labs ordered are listed,  but only abnormal results are displayed)  Labs Reviewed  COMPREHENSIVE METABOLIC PANEL - Abnormal; Notable for the following components:      Result Value   Chloride 92 (*)    CO2 33 (*)    Glucose, Bld 134 (*)    All other components within normal limits  CBC WITH DIFFERENTIAL/PLATELET - Abnormal; Notable for the following components:   WBC 17.6 (*)    Neutro Abs 14.6 (*)    Monocytes Absolute 2.0 (*)    All other components within normal limits  BRAIN NATRIURETIC PEPTIDE - Abnormal; Notable for the following components:   B Natriuretic Peptide 174.4 (*)    All other components within normal limits  RESPIRATORY PANEL BY RT PCR (FLU A&B, COVID)  CULTURE, BLOOD (ROUTINE X 2)  CULTURE, BLOOD (ROUTINE X 2)  LACTIC ACID, PLASMA  PROCALCITONIN  HIV ANTIBODY (ROUTINE TESTING W REFLEX)  LEGIONELLA PNEUMOPHILA SEROGP 1 UR AG  STREP PNEUMONIAE URINARY ANTIGEN  TROPONIN I (HIGH SENSITIVITY)  TROPONIN I (HIGH SENSITIVITY)   ____________________________________________  EKG  ED ECG REPORT I, Arta Silence, the attending physician, personally viewed and interpreted this ECG.  Date: 03/26/2020 EKG Time: 0959 Rate: 108 Rhythm: Sinus tachycardia QRS Axis: normal Intervals: normal ST/T Wave abnormalities: Nonspecific ST abnormalities Narrative Interpretation: Nonspecific abnormalities with no evidence of acute ischemia  ____________________________________________  RADIOLOGY  CXR interpreted by me shows a left-sided interstitial infiltrate and possible small effusion  ____________________________________________   PROCEDURES  Procedure(s) performed: No  Procedures  Critical Care performed: Yes  CRITICAL CARE Performed by: Arta Silence   Total critical care time: 30 minutes  Critical care time was exclusive of separately billable procedures and treating other patients.  Critical care was necessary to treat or prevent imminent or life-threatening  deterioration.  Critical care was time spent personally by me on the following activities: development of treatment plan with patient and/or surrogate as well as nursing, discussions with consultants, evaluation of patient's response to treatment, examination of patient, obtaining history from patient or surrogate, ordering and performing treatments and interventions, ordering and review of laboratory studies, ordering and review of radiographic studies, pulse oximetry and re-evaluation of patient's condition. ____________________________________________   INITIAL IMPRESSION / ASSESSMENT AND PLAN / ED COURSE  Pertinent labs & imaging results that were available during my care of the patient were reviewed by me and considered in my medical decision making (see chart for details).  59 year old male with PMH as noted above including a history of hypertension but no known lung disease presents with gradual onset of shortness of breath associated with productive cough over the last several days.  He was found to be significantly hypoxic by EMS and was placed on a nonrebreather.  I reviewed the past medical records in Epic; the patient's last ED visit was in 2020 for a mental health complaint.  On exam currently, the patient has increased work of breathing and tachypnea without acute distress.  O2 saturation  is in the low nineties on nasal cannula.  Lung exam reveals diffuse wheezing bilaterally.  He has no peripheral edema.  The exam is otherwise as described above.  Differential includes bacterial pneumonia, COVID-19, new onset COPD or less likely CHF.  Given the diffuse wheezing and a productive cough I have a low suspicion for PE.  We will obtain chest x-ray, lab work-up including COVID-19 swab, and reassess.  ----------------------------------------- 2:38 PM on 03/26/2020 -----------------------------------------  Chest x-ray shows left-sided interstitial opacity with a possible small effusion,  and the WBC count is significantly elevated.  Lactate is normal.  Covid is negative.  Recitation is most consistent with community-acquired pneumonia.  Antibiotics have been initiated.  The patient is stable on 3 L by nasal cannula at this time.  I discussed the case with Dr. Blaine Hamper from the hospitalist service for admission.  __________________________  [name] was evaluated in Emergency Department on 03/26/2020 for the symptoms described in the history of present illness. He was evaluated in the context of the global COVID-19 pandemic, which necessitated consideration that the patient might be at risk for infection with the SARS-CoV-2 virus that causes COVID-19. Institutional protocols and algorithms that pertain to the evaluation of patients at risk for COVID-19 are in a state of rapid change based on information released by regulatory bodies including the CDC and federal and state organizations. These policies and algorithms were followed during the patient's care in the ED.  ____________________________________________   FINAL CLINICAL IMPRESSION(S) / ED DIAGNOSES  Final diagnoses:  Acute respiratory failure with hypoxia ([place])  Community acquired pneumonia, unspecified laterality      NEW MEDICATIONS STARTED DURING THIS VISIT:  New Prescriptions   No medications on file     Note:  This document was prepared using Dragon voice recognition software and may include unintentional dictation errors.    Arta Silence, MD 03/26/20 1439

## 2020-03-26 NOTE — ED Notes (Signed)
Patient with episode of desaturation to 81% on 4L [place] around 1715, diaphoretic, tachypneic to RR40, more somnolent than admission. Remained hypoxic on [place], placed on NRB. PRN neb administered. MD notified. RT called to bedside as well. Patient with minimal air movement, crackles, and wheeze. Patient had received 2.5L bolus throughout day. Orders for lasix received and administered with good output as charted.   Patient eventually placed on bipap with improvement in mental status and WOB. Having difficulty tolerating 2/2 sensation of air blowing. At shift change, patient was back on 4L  2/2 unable to tolerate bipap. MD notified and PRN ativan order received.  Plan for CTA when RN able to transport. ICU to consult 2/2 ABG results. Care transferred to next shift.

## 2020-03-27 ENCOUNTER — Inpatient Hospital Stay: Payer: Medicare Other

## 2020-03-27 LAB — BRAIN NATRIURETIC PEPTIDE: B Natriuretic Peptide: 176.5 pg/mL — ABNORMAL HIGH (ref 0.0–100.0)

## 2020-03-27 LAB — CBC
HCT: 43.4 % (ref 39.0–52.0)
Hemoglobin: 13.8 g/dL (ref 13.0–17.0)
MCH: 30.1 pg (ref 26.0–34.0)
MCHC: 31.8 g/dL (ref 30.0–36.0)
MCV: 94.6 fL (ref 80.0–100.0)
Platelets: 224 10*3/uL (ref 150–400)
RBC: 4.59 MIL/uL (ref 4.22–5.81)
RDW: 12.6 % (ref 11.5–15.5)
WBC: 17.2 10*3/uL — ABNORMAL HIGH (ref 4.0–10.5)
nRBC: 0 % (ref 0.0–0.2)

## 2020-03-27 LAB — ECHOCARDIOGRAM COMPLETE
Area-P 1/2: 4.06 cm2
Height: 72 in
S' Lateral: 2.78 cm
Weight: 2736 oz

## 2020-03-27 LAB — BASIC METABOLIC PANEL
Anion gap: 9 (ref 5–15)
BUN: 11 mg/dL (ref 6–20)
CO2: 33 mmol/L — ABNORMAL HIGH (ref 22–32)
Calcium: 8.7 mg/dL — ABNORMAL LOW (ref 8.9–10.3)
Chloride: 96 mmol/L — ABNORMAL LOW (ref 98–111)
Creatinine, Ser: 0.76 mg/dL (ref 0.61–1.24)
GFR, Estimated: >60 mL/min (ref >60)
Glucose, Bld: 136 mg/dL — ABNORMAL HIGH (ref 70–99)
Potassium: 4.6 mmol/L (ref 3.5–5.1)
Sodium: 138 mmol/L (ref 135–145)

## 2020-03-27 LAB — BLOOD GAS, ARTERIAL
Acid-Base Excess: 11.7 mmol/L — ABNORMAL HIGH (ref 0.0–2.0)
Bicarbonate: 41 mmol/L — ABNORMAL HIGH (ref 20.0–28.0)
FIO2: 0.32
O2 Saturation: 88 %
Patient temperature: 37
pCO2 arterial: 76 mmHg (ref 32.0–48.0)
pH, Arterial: 7.34 — ABNORMAL LOW (ref 7.350–7.450)
pO2, Arterial: 58 mmHg — ABNORMAL LOW (ref 83.0–108.0)

## 2020-03-27 LAB — LEGIONELLA PNEUMOPHILA SEROGP 1 UR AG: L. pneumophila Serogp 1 Ur Ag: NEGATIVE

## 2020-03-27 LAB — PROCALCITONIN: Procalcitonin: <0.1 ng/mL

## 2020-03-27 LAB — GLUCOSE, CAPILLARY: Glucose-Capillary: 104 mg/dL — ABNORMAL HIGH (ref 70–99)

## 2020-03-27 MED ORDER — CHLORHEXIDINE GLUCONATE CLOTH 2 % EX PADS
6.0000 | MEDICATED_PAD | Freq: Every day | CUTANEOUS | Status: DC
  Administered 2020-03-27 – 2020-03-29 (×2): 6 via TOPICAL

## 2020-03-27 MED ORDER — MORPHINE SULFATE (PF) 2 MG/ML IV SOLN: 2.0000 mg | INTRAVENOUS | Status: DC | PRN

## 2020-03-27 NOTE — Progress Notes (Signed)
NAME:  Kenneth Niazi., MRN:  [id number], DOB:  1960/12/15, LOS: 1 ADMISSION DATE:  03/26/2020, CONSULTATION DATE:  03/26/2020 REFERRING MD:  Dr. Blaine Hamper, CHIEF COMPLAINT:  Shortness of breath  Brief History   59 y.o. Male admitted with Sepsis and Acute Hypoxic Hypercapnic Respiratory Failure in the setting of suspected Community Acquired Pneumonia vs. Pulmonary Edema, along with suspected undiagnosed COPD requiring BiPAP  Past Medical History  HTN Paranoid schizophrenia Bipolar disorder  Significant Hospital Events   10/12: Presented to ED; to be admitted to [place] unit by Hospitalist 10/12: While in ED following IVF resuscitation, developed Acute Respiratory Distress and Somnolence, placed on BiPAP with PCCM consultation   Consults:    Procedures:    Significant Diagnostic Tests:  10/12: CXR>>1. Increased interstitial markings particularly in the LEFT lung base with small LEFT effusion versus pleural-parenchymal scarring. Findings could represent atypical infection versus asymmetric edema. 2. Small effusion versus pleural and parenchymal scarring at the LEFT lung base. 3. Fractures of ribs 7 through 10 on the LEFT posteriorly appear to show callus formation likely subacute or chronic. Correlate with any trauma history and any pain in this location, findings are new since 2012 without recent comparison. 10/12: 2D Echocardiogram>> 10/12: CTA Chest>>1. Bronchiolitis. Indeterminate etiology, possibly infectious or inflammatory. Consider trial therapy with follow-up HRCT as outpatient for follow-up. 2. A 1 x 1.4 cm solid pulmonary nodule. Recommend repeat CT in 3 months to evaluate stability. 3. Indeterminate right hilar lymphadenopathy that is likely reactive. Attention on follow-up. 4. No pulmonary embolus.  Micro Data:  10/12: SARS-CoV-2>> negative 10/12: Influenza PCR>> negative 10/12: Blood culture x2>> 10/12: Sputum >> 10/12: Strep pneumo urinary antigen>> 10/12:  Legionella urinary antigen>>  Antimicrobials:  Azithromycin 10/12>> Ceftriaxone 10/12>>  Interim history/subjective:  Patient lying in bed, alert & responsive, dyspneic with abdominal muscle use.  When asked how his breathing feels, "normal- this is how I usually feel at home" Discussed wearing the Bipap PRN and QHS, he said that would be "OK".   Labs/ Imaging personally reviewed Net: +1.5 L Serum CO2: 33 Hgb: 13.8 TMAX/ WBC: 36.9/ 17.2  PCT: < 0.10 BNP: 176.5  ABG: 7.34/ 76/ 58/ 41 CXR 03/27/20: persistent diffuse bilateral reticular and nodular opacities, compatible with inflammatory/infectious bronchiolitis.  Chronic left pleural thickening and scarring  Objective   Blood pressure 132/88, pulse (!) 108, temperature 97.6 F (36.4 C), temperature source Oral, resp. rate (!) 29, height 6' (1.829 m), weight 77.6 kg, SpO2 96 %.        Intake/Output Summary (Last 24 hours) at 03/27/2020 0802 Last data filed at 03/26/2020 1850 Gross per 24 hour  Intake 1750 ml  Output 250 ml  Net 1500 ml   Filed Weights   03/26/20 1002  Weight: 77.6 kg    Examination: General: Adult male, chronically ill appearing, lying in bed on [place] dyspneic  HEENT: MM pink/moist, anicteric  Neuro: Alert/oriented, following commands, PERRL +3, MAE CV: s1s2 RRR, NSR on monitor, no r/m/g Pulm: Regular, non labored on 3 L [place] , breath sounds coarse with expiratory wheezing- BUL & diminished-BLL GI: soft, non tender, bs x 4 GU: urinal use-clear yellow urine Skin: ecchymosis scattered, no rashes/lesions noted Extremities: warm/dry, pulses + 2 R/P, no edema noted  Resolved Hospital Problem list     Assessment & Plan:  Acute Hypoxic Hypercapnic Respiratory Failure in the setting of suspected Community Acquired Pneumonia vs. Pulmonary Edema Suspect Undiagnosed COPD Para-mediastinal Pulmonary Nodule CTA Chest on 04/04/20 negative for PE,  was concerning for Bronchiolitis and a 1 x 1.4 cm solid pulmonary  nodule. Patient has a 15 pack year history, and has not been evaluated for lung disease- per pt report.  - Supplemental O2 as needed to maintain SpO2 > 92% - BIPAP PRN and QHS - Intermittent CXR & ABG PRN - Continue solu medrol 40 mg BID - continue pulmicort BID, duonebs Q 4, additional bronchodilators PRN - continue Azithromycin & Ceftriaxone for CAP coverage - follow up with outpatient CT chest for evaluation of pulmonary nodule   Sepsis secondary to suspected CAP  - monitor WBC/ fever curve - trend PCT - follow cultures - Continue Azithromycin & Ceftriaxone  Best practice:  Diet: Cardiac Pain/Anxiety/Delirium protocol (if indicated): N/A VAP protocol (if indicated): N/A DVT prophylaxis: lovenox SQ GI prophylaxis: N/A Glucose control:  Mobility: mobilize as tolerated Code Status: FULL Family Communication: Patient updated bedside Disposition: SDU  Labs   CBC: Recent Labs  Lab 03/26/20 1006 03/27/20 0359  WBC 17.6* 17.2*  NEUTROABS 14.6*  --   HGB 15.2 13.8  HCT 46.3 43.4  MCV 92.2 94.6  PLT 234 086    Basic Metabolic Panel: Recent Labs  Lab 03/26/20 1006 03/27/20 0359  NA 136 138  K 4.0 4.6  CL 92* 96*  CO2 33* 33*  GLUCOSE 134* 136*  BUN 15 11  CREATININE 0.80 0.76  CALCIUM 9.3 8.7*   GFR: Estimated Creatinine Clearance: 110.5 mL/min (by C-G formula based on SCr of 0.76 mg/dL). Recent Labs  Lab 03/26/20 1004 03/26/20 1006 03/26/20 1356 03/27/20 0359  PROCALCITON  --   --  <0.10 <0.10  WBC  --  17.6*  --  17.2*  LATICACIDVEN 1.6  --   --   --     Liver Function Tests: Recent Labs  Lab 03/26/20 1006  AST 25  ALT 20  ALKPHOS 80  BILITOT 0.5  PROT 7.6  ALBUMIN 3.8   No results for input(s): LIPASE, AMYLASE in the last 168 hours. No results for input(s): AMMONIA in the last 168 hours.  ABG    Component Value Date/Time   PHART 7.40 03/26/2020 2030   PCO2ART 54 (H) 03/26/2020 2030   PO2ART 156 (H) 03/26/2020 2030   HCO3 33.4 (H)  03/26/2020 2030   O2SAT 99.4 03/26/2020 2030     Coagulation Profile: No results for input(s): INR, PROTIME in the last 168 hours.  Cardiac Enzymes: No results for input(s): CKTOTAL, CKMB, CKMBINDEX, TROPONINI in the last 168 hours.  HbA1C: Hgb A1c MFr Bld  Date/Time Value Ref Range Status  03/15/2018 09:53 PM 5.5 4.8 - 5.6 % Final    Comment:    (NOTE)         Prediabetes: 5.7 - 6.4         Diabetes: >6.4         Glycemic control for adults with diabetes: <7.0     CBG: Recent Labs  Lab 03/26/20 2111  GLUCAP 137*    Critical care time:     Domingo Pulse [place], AGACNP-BC [facility]    Please see Amion for pager details.

## 2020-03-27 NOTE — Progress Notes (Addendum)
PROGRESS NOTE    Kenneth [name].  TFT:[id number] DOB: Oct 06, 1960 DOA: 03/26/2020 PCP: Patient, No Pcp Per  Outpatient Specialists: none    Brief Narrative:   Kenneth Cotten. is a 59 y.o. male with medical history significant of hypertension, bipolar, paranoid schizophrenia, tobacco abuse, who presents with cough and shortness breath.  P has been having shortness of breath for more than 3 days, which gradually started and has been progressively worsening.  Patient has productive cough with yellow-colored sputum production.The patient states that he felt like he had a viral upper respiratory syndrome earlier in the weekend, and then developed the more severe shortness of breath in the last few days.  Patient denies chest pain, fever or chills.  Patient does not have nausea, vomiting, diarrhea, abdominal, symptoms of UTI or unilateral weakness.  Patient is calm.  No suicidal or homicidal ideations. He states he has been vaccinated for Covid.   Per report, patient oxygen desaturated to 60s on room air, patient was put on nonrebreather. Pt was given Solu-Medrol and DuoNeb nebulizers by EMS.  Oxygen saturation improved to 91-94% on 3 L oxygen in ED.   ED Course: pt was found to have WBC 17.6, negative COVID-19 PCR, lactic acid 1.6, electrolytes renal function okay, temperature normal, blood pressure 143/77, tachycardia with heart rate of 104, tachypnea with RR 34. I have personally reviewed the chest x-ray, which showed prominent interstitial marker in the left lung base with possible small left pleural effusion. Patient is admitted to [facility] [place] bed as inpatient.  In late afternoon of day of admission, patient developed worsening shortness of breath.  Auscultation showed diffuse crackles, mild wheezing and decreased air movement.  This happened after the patient received 2.5 L normal saline.  ABG with pH of 7.16, PCO2 100, PO2 72. Started on bipap, given lasix 20, critical care  consulted.  Assessment & Plan:   Principal Problem:   CAP (community acquired pneumonia) Active Problems:   Tobacco use disorder   Paranoid schizophrenia ([place])   Bipolar 1 disorder ([place])   Hypertension   Sepsis ([place])   Acute respiratory failure with hypoxia ([place])   Sepsis and acute respiratory failure with hypoxia due to possible CAP: Patient has productive cough, shortness of breath, chest x-ray showed prominent interstitial marking, though no obvious consolidation, clinically patient seems to have pneumonia.  Patient is smoker, currently smokes 3 cigarettes/day.  He has wheezing on auscultation, indicating possible undiagnosed COPD with exacerbation.  Patient meets criteria for sepsis with leukocytosis, tachycardia and tachypnea. Worsening respiratory statuson 10/12, hypoxic and acidemic on abg, placed on bipap in the afternoon and given lasix 20, did not tolerate and self d/c'd in yesterday evening. This morning comfortable but tachypnic, wheezing on exam. TTE performed, unremarkable. - continue step-down status, appreciate critical care recs - cong IV Rocephin and azithromycin. Legionella antigen neg, f/u strep. Blood cultures negative, obtain sputum for culture when able. Flu/covid neg. Ordering respiratory viral panel. procalcitonin low - Mucinex for cough  - duonebs, budesonide per critical care, solu-medrol - will repeat cxr and abg this morning. Given sig wheeze and tachypnea low threshold to place back on bipap   Schizophrenia  On paliperidone IM q 28 days - need to determine last dose  Pulmonary nodule - will need outpt repeat CT in 3 mo   DVT prophylaxis: lovenox Code Status: full Family Communication: none at bedside  Status is: Inpatient  Remains inpatient appropriate because:Inpatient level of care appropriate due to severity of  illness   Dispo: The patient is from: Group home              Anticipated d/c is to: Group home              Anticipated d/c date  is: > 3 days              Patient currently is not medically stable to d/c.        Consultants:  Critical care  Procedures: TTE  Antimicrobials:  Ceftriaxone/azithromycin 10/12 >    Subjective: This morning says breathing is stable. Wheezing and tachypnic. No chest pain. Has appetite, no abd pain, no diarrhea, no vomiting.   Objective: Vitals:   03/26/20 2341 03/27/20 0000 03/27/20 0400 03/27/20 0747  BP:  132/88    Pulse:  (!) 108    Resp:  (!) 29    Temp:  98 F (36.7 C) 97.6 F (36.4 C)   TempSrc:  Oral Oral   SpO2: 92% 97%  96%  Weight:      Height:        Intake/Output Summary (Last 24 hours) at 03/27/2020 0813 Last data filed at 03/26/2020 1850 Gross per 24 hour  Intake 1750 ml  Output 250 ml  Net 1500 ml   Filed Weights   03/26/20 1002  Weight: 77.6 kg    Examination:  General exam: Appears calm and comfortable  Respiratory system: tachypnic, wheezes throughout Cardiovascular system: S1 & S2 heard, soft systolic murmur Gastrointestinal system: Abdomen is nondistended, soft and nontender. No organomegaly or masses felt. Normal bowel sounds heard. Central nervous system: Alert and oriented. No focal neurological deficits. Extremities: Symmetric 5 x 5 power. Skin: No rashes, lesions or ulcers Psychiatry: alert and oriented    Data Reviewed: I have personally reviewed following labs and imaging studies  CBC: Recent Labs  Lab 03/26/20 1006 03/27/20 0359  WBC 17.6* 17.2*  NEUTROABS 14.6*  --   HGB 15.2 13.8  HCT 46.3 43.4  MCV 92.2 94.6  PLT 234 627   Basic Metabolic Panel: Recent Labs  Lab 03/26/20 1006 03/27/20 0359  NA 136 138  K 4.0 4.6  CL 92* 96*  CO2 33* 33*  GLUCOSE 134* 136*  BUN 15 11  CREATININE 0.80 0.76  CALCIUM 9.3 8.7*   GFR: Estimated Creatinine Clearance: 110.5 mL/min (by C-G formula based on SCr of 0.76 mg/dL). Liver Function Tests: Recent Labs  Lab 03/26/20 1006  AST 25  ALT 20  ALKPHOS 80  BILITOT  0.5  PROT 7.6  ALBUMIN 3.8   No results for input(s): LIPASE, AMYLASE in the last 168 hours. No results for input(s): AMMONIA in the last 168 hours. Coagulation Profile: No results for input(s): INR, PROTIME in the last 168 hours. Cardiac Enzymes: No results for input(s): CKTOTAL, CKMB, CKMBINDEX, TROPONINI in the last 168 hours. BNP (last 3 results) No results for input(s): PROBNP in the last 8760 hours. HbA1C: No results for input(s): HGBA1C in the last 72 hours. CBG: Recent Labs  Lab 03/26/20 2111  GLUCAP 137*   Lipid Profile: No results for input(s): CHOL, HDL, LDLCALC, TRIG, CHOLHDL, LDLDIRECT in the last 72 hours. Thyroid Function Tests: No results for input(s): TSH, T4TOTAL, FREET4, T3FREE, THYROIDAB in the last 72 hours. Anemia Panel: No results for input(s): VITAMINB12, FOLATE, FERRITIN, TIBC, IRON, RETICCTPCT in the last 72 hours. Urine analysis:    Component Value Date/Time   COLORURINE AMBER (A) 03/17/2018 0641   APPEARANCEUR CLEAR (A) 03/17/2018  [place] 05/13/2013 1142   LABSPEC 1.027 03/17/2018 0641   LABSPEC 1.017 05/13/2013 1142   PHURINE 6.0 03/17/2018 0641   GLUCOSEU NEGATIVE 03/17/2018 0641   GLUCOSEU Negative 05/13/2013 [place] 03/17/2018 0641   BILIRUBINUR NEGATIVE 03/17/2018 0641   BILIRUBINUR Negative 05/13/2013 1142   KETONESUR NEGATIVE 03/17/2018 0641   PROTEINUR 30 (A) 03/17/2018 0641   NITRITE NEGATIVE 03/17/2018 0641   LEUKOCYTESUR NEGATIVE 03/17/2018 0641   LEUKOCYTESUR Negative 05/13/2013 1142   Sepsis Labs: @LABRCNTIP (procalcitonin:4,lacticidven:4)  ) Recent Results (from the past 240 hour(s))  Respiratory Panel by RT PCR (Flu A&B, Covid) - Nasopharyngeal Swab     Status: None   Collection Time: 03/26/20 10:06 AM   Specimen: Nasopharyngeal Swab  Result Value Ref Range Status   SARS Coronavirus 2 by RT PCR NEGATIVE NEGATIVE Final    Comment: (NOTE) SARS-CoV-2 target nucleic acids are NOT  DETECTED.  The SARS-CoV-2 RNA is generally detectable in upper respiratoy specimens during the acute phase of infection. The lowest concentration of SARS-CoV-2 viral copies this assay can detect is 131 copies/mL. A negative result does not preclude SARS-Cov-2 infection and should not be used as the sole basis for treatment or other patient management decisions. A negative result may occur with  improper specimen collection/handling, submission of specimen other than nasopharyngeal swab, presence of viral mutation(s) within the areas targeted by this assay, and inadequate number of viral copies (<131 copies/mL). A negative result must be combined with clinical observations, patient history, and epidemiological information. The expected result is Negative.  Fact Sheet for Patients:  PinkCheek.be  Fact Sheet for Healthcare Providers:  GravelBags.it  This test is no t yet approved or cleared by the [place] FDA and  has been authorized for detection and/or diagnosis of SARS-CoV-2 by FDA under an Emergency Use Authorization (EUA). This EUA will remain  in effect (meaning this test can be used) for the duration of the COVID-19 declaration under Section 564(b)(1) of the Act, 21 U.S.C. section 360bbb-3(b)(1), unless the authorization is terminated or revoked sooner.     Influenza A by PCR NEGATIVE NEGATIVE Final   Influenza B by PCR NEGATIVE NEGATIVE Final    Comment: (NOTE) The Xpert Xpress SARS-CoV-2/FLU/RSV assay is intended as an aid in  the diagnosis of influenza from Nasopharyngeal swab specimens and  should not be used as a sole basis for treatment. Nasal washings and  aspirates are unacceptable for Xpert Xpress SARS-CoV-2/FLU/RSV  testing.  Fact Sheet for Patients: PinkCheek.be  Fact Sheet for Healthcare Providers: GravelBags.it  This test is not yet  approved or cleared by the [place] FDA and  has been authorized for detection and/or diagnosis of SARS-CoV-2 by  FDA under an Emergency Use Authorization (EUA). This EUA will remain  in effect (meaning this test can be used) for the duration of the  Covid-19 declaration under Section 564(b)(1) of the Act, 21  U.S.C. section 360bbb-3(b)(1), unless the authorization is  terminated or revoked. Performed at [facility], [address] [place], [place] [address]   Culture, blood (Routine X 2) w Reflex to ID Panel     Status: None (Preliminary result)   Collection Time: 03/26/20 12:51 PM   Specimen: BLOOD  Result Value Ref Range Status   Specimen Description BLOOD RIGHT HAND  Final   Special Requests   Final    BOTTLES DRAWN AEROBIC AND ANAEROBIC Blood Culture results may not be optimal due to an inadequate volume  of blood received in culture bottles   Culture   Final    NO GROWTH < 24 HOURS Performed at [facility], [address] [place], [place] [address]    Report Status PENDING  Incomplete  Culture, blood (Routine X 2) w Reflex to ID Panel     Status: None (Preliminary result)   Collection Time: 03/26/20 12:51 PM   Specimen: BLOOD  Result Value Ref Range Status   Specimen Description BLOOD RIGHT ASSIST CONTROL  Final   Special Requests   Final    BOTTLES DRAWN AEROBIC AND ANAEROBIC Blood Culture results may not be optimal due to an inadequate volume of blood received in culture bottles   Culture   Final    NO GROWTH < 24 HOURS Performed at [facility], [address], [place], [place] [address]    Report Status PENDING  Incomplete         Radiology Studies: CT ANGIO CHEST PE W OR WO CONTRAST  Result Date: 03/26/2020 CLINICAL DATA:  SHOB/productive cough since Sunday. EXAM: CT ANGIOGRAPHY CHEST WITH CONTRAST TECHNIQUE: Multidetector CT imaging of the chest was performed using the standard protocol during bolus administration of  intravenous contrast. Multiplanar CT image reconstructions and MIPs were obtained to evaluate the vascular anatomy. CONTRAST:  138mL OMNIPAQUE IOHEXOL 350 MG/ML SOLN COMPARISON:  Chest x-ray 03/26/2020. chest x-ray 06/03/2011 FINDINGS: Cardiovascular: Satisfactory opacification of the pulmonary arteries to the segmental level. No evidence of pulmonary embolism. Normal heart size. No pericardial effusion. Mediastinum/Nodes: There is a an enlarged right hilar lymph node measuring up to 1.9 cm in short axis. No enlarged mediastinal or axillary lymph nodes. The thyroid and esophagus demonstrate no significant findings. Debris within the trachea. Lungs/Pleura: Diffuse, with lower lobe predominance, small centrilobular nodules and tree in bud appearance. Suggestion of cavitation within the right upper and lower lobe (6: 54, 51-52). Similar possible cavitary findings within the left upper lobe (6:21). There is a paramediastinal 1 x 1.4 cm solid pulmonary nodule (4:60). Left base scarring. Upper Abdomen: No acute abnormality. Musculoskeletal: No chest wall abnormality No suspicious lytic or blastic osseous lesions. No acute displaced fracture. Old healed left rib fractures. Multilevel degenerative changes of the spine. Review of the MIP images confirms the above findings. IMPRESSION: 1. Bronchiolitis. Indeterminate etiology, possibly infectious or inflammatory. Consider trial therapy with follow-up HRCT as outpatient for follow-up. 2. A 1 x 1.4 cm solid pulmonary nodule. Recommend repeat CT in 3 months to evaluate stability. 3. Indeterminate right hilar lymphadenopathy that is likely reactive. Attention on follow-up. 4. No pulmonary embolus. Electronically Signed   By: Iven Finn M.D.   On: 03/26/2020 21:27   DG Chest Portable 1 View  Result Date: 03/26/2020 CLINICAL DATA:  Shortness of breath, productive cough since Sunday EXAM: PORTABLE CHEST 1 VIEW COMPARISON:  Examination from 20/12 FINDINGS: Trachea midline.   Cardiomediastinal contours are normal. No lobar consolidation. Increased interstitial markings particularly in the LEFT lung base. Suggestion of LEFT-sided effusion versus pleural-parenchymal scarring Fractures of ribs 7 through 10 on the LEFT posteriorly appear to show callus formation likely subacute or chronic. These were not present on the prior study. IMPRESSION: 1. Increased interstitial markings particularly in the LEFT lung base with small LEFT effusion versus pleural-parenchymal scarring. Findings could represent atypical infection versus asymmetric edema. 2. Small effusion versus pleural and parenchymal scarring at the LEFT lung base. 3. Fractures of ribs 7 through 10 on the LEFT posteriorly appear to show callus formation likely subacute  or chronic. Correlate with any trauma history and any pain in this location, findings are new since 2012 without recent comparison. Electronically Signed   By: Zetta Bills M.D.   On: 03/26/2020 10:30        Scheduled Meds: . budesonide (PULMICORT) nebulizer solution  0.5 mg Nebulization BID  . Chlorhexidine Gluconate Cloth  6 each Topical Daily  . enoxaparin (LOVENOX) injection  40 mg Subcutaneous Q24H  . ipratropium-albuterol  3 mL Nebulization Q4H  . methylPREDNISolone (SOLU-MEDROL) injection  40 mg Intravenous Q12H  . nicotine  21 mg Transdermal Daily   Continuous Infusions: . azithromycin Stopped (03/26/20 1453)  . cefTRIAXone (ROCEPHIN)  IV Stopped (03/26/20 1250)     LOS: 1 day    Time spent: 30 min    Desma Maxim, MD [facility]   If 7PM-7AM, please contact night-coverage [email or web address] Password [facility] 03/27/2020, 8:13 AM

## 2020-03-28 LAB — COMPREHENSIVE METABOLIC PANEL
ALT: 22 U/L (ref 0–44)
AST: 16 U/L (ref 15–41)
Albumin: 2.9 g/dL — ABNORMAL LOW (ref 3.5–5.0)
Alkaline Phosphatase: 76 U/L (ref 38–126)
Anion gap: 7 (ref 5–15)
BUN: 16 mg/dL (ref 6–20)
CO2: 38 mmol/L — ABNORMAL HIGH (ref 22–32)
Calcium: 8.8 mg/dL — ABNORMAL LOW (ref 8.9–10.3)
Chloride: 97 mmol/L — ABNORMAL LOW (ref 98–111)
Creatinine, Ser: 0.57 mg/dL — ABNORMAL LOW (ref 0.61–1.24)
GFR, Estimated: >60 mL/min (ref >60)
Glucose, Bld: 117 mg/dL — ABNORMAL HIGH (ref 70–99)
Potassium: 4.2 mmol/L (ref 3.5–5.1)
Sodium: 142 mmol/L (ref 135–145)
Total Bilirubin: 0.3 mg/dL (ref 0.3–1.2)
Total Protein: 6 g/dL — ABNORMAL LOW (ref 6.5–8.1)

## 2020-03-28 LAB — CBC
HCT: 41.5 % (ref 39.0–52.0)
Hemoglobin: 13.2 g/dL (ref 13.0–17.0)
MCH: 30.1 pg (ref 26.0–34.0)
MCHC: 31.8 g/dL (ref 30.0–36.0)
MCV: 94.5 fL (ref 80.0–100.0)
Platelets: 239 10*3/uL (ref 150–400)
RBC: 4.39 MIL/uL (ref 4.22–5.81)
RDW: 12.5 % (ref 11.5–15.5)
WBC: 14 10*3/uL — ABNORMAL HIGH (ref 4.0–10.5)
nRBC: 0 % (ref 0.0–0.2)

## 2020-03-28 LAB — PROCALCITONIN: Procalcitonin: <0.1 ng/mL

## 2020-03-28 MED ORDER — IPRATROPIUM-ALBUTEROL 0.5-2.5 (3) MG/3ML IN SOLN
3.0000 mL | Freq: Three times a day (TID) | RESPIRATORY_TRACT | Status: DC
  Administered 2020-03-28 – 2020-03-30 (×5): 3 mL via RESPIRATORY_TRACT
  Filled 2020-03-28 (×6): qty 3

## 2020-03-28 MED ORDER — AZITHROMYCIN 250 MG PO TABS
250.0000 mg | ORAL_TABLET | Freq: Every day | ORAL | Status: DC
  Administered 2020-03-28 – 2020-03-30 (×3): 250 mg via ORAL
  Filled 2020-03-28 (×3): qty 1

## 2020-03-28 MED ORDER — IPRATROPIUM-ALBUTEROL 0.5-2.5 (3) MG/3ML IN SOLN
3.0000 mL | Freq: Four times a day (QID) | RESPIRATORY_TRACT | Status: DC
  Administered 2020-03-28: 3 mL via RESPIRATORY_TRACT
  Filled 2020-03-28: qty 3

## 2020-03-28 MED ORDER — PREDNISONE 20 MG PO TABS
40.0000 mg | ORAL_TABLET | Freq: Every day | ORAL | Status: DC
  Administered 2020-03-29 – 2020-03-30 (×2): 40 mg via ORAL
  Filled 2020-03-28: qty 2
  Filled 2020-03-28: qty 4

## 2020-03-28 NOTE — Evaluation (Signed)
Occupational Therapy Evaluation Patient Details Name: Kenneth [name]. MRN: [id number] DOB: 03-08-1961 Today's Date: 03/28/2020    History of Present Illness 59 y.o. male with medical history significant of hypertension, bipolar, paranoid schizophrenia, tobacco abuse, who presented to the ED with cough and shortness breath x3 days, ultimately requiring BiPAP briefly. On 3L O2 at time of OT Evaluation.   Clinical Impression   Pt was seen for OT evaluation this date. Pt pleasant and agreeable to participate. Prior to hospital admission, pt was independent with mobility and basic ADL tasks and laundry. Pt received assist from group home staff for transportation, medication mgt, housekeeping tasks, and meals. Pt lives on the 1st floor of 2 story group home (offices on 2nd floor), ramped entrance, and access to a tub/shower. Pt endorses 1 fall about 1 year ago. Currently pt demonstrates impairments as described below (See OT problem list) which functionally limit *his/her ability to perform ADL/self-care tasks. Pt currently requires supervision for safety for mobility and ADL tasks 2/2 decreased safety awareness (very mildly impulsive) and cardiopulmonary status and desats with exertion (See below for detail). Pt currently on 3L O2, desats to 84-85% on room air with exertion, 3L O2 replaced and pt able to recover to 91-92% ~70min. Pt instructed in energy conservation strategies including activity pacing, work simplification, and pursed lip breathing to support recovery and minimize risk of SOB/over exertion. Pt verbalized understanding, occasionally requiring VC for PLB. Pt would benefit from skilled OT services to address noted impairments and functional limitations (see below for any additional details) in order to maximize safety and independence while minimizing falls risk and caregiver burden. Upon hospital discharge, recommend HHOT to maximize pt safety and return to functional independence during  meaningful occupations of daily life.     Follow Up Recommendations  Home health OT;Supervision/Assistance - 24 hour    Equipment Recommendations  None recommended by OT    Recommendations for Other Services       Precautions / Restrictions Precautions Precautions: Fall Precaution Comments: monitor O2 sats; per MD note "wean as able" Restrictions Weight Bearing Restrictions: No      Mobility Bed Mobility Overal bed mobility: Independent                Transfers Overall transfer level: Independent                    Balance Overall balance assessment: No apparent balance deficits (not formally assessed)                                         ADL either performed or assessed with clinical judgement   ADL Overall ADL's : Independent                                       General ADL Comments: supervision for safety and to monitor O2 sats but no physical difficulty to perform     Vision Baseline Vision/History: Wears glasses Wears Glasses: Reading only Patient Visual Report: No change from baseline Vision Assessment?: No apparent visual deficits     Perception     Praxis      Pertinent Vitals/Pain Pain Assessment: No/denies pain     Hand Dominance Right   Extremity/Trunk Assessment Upper Extremity Assessment Upper Extremity Assessment: Overall WFL for  tasks assessed (pt reports hx of intermittent sharp/shooting R shoulder pain; none at time of evaluation)   Lower Extremity Assessment Lower Extremity Assessment: Overall WFL for tasks assessed   Cervical / Trunk Assessment Cervical / Trunk Assessment: Normal   Communication Communication Communication: No difficulties   Cognition Arousal/Alertness: Awake/alert Behavior During Therapy: WFL for tasks assessed/performed Overall Cognitive Status: Within Functional Limits for tasks assessed                                 General Comments:  alert and oriented, follows all commands   General Comments  Pt on 3L at start of session, O2 sats 91%. Denied SOB. On 3L able to perform bed mobility, transfers, and march in place with O2 sats down to 88-89%. On room air pt desats to 84-85% and unable to recover within 30min, ultimately requiring 3L with improvement to 91-92% with >79min and cues for pursed lip breathing    Exercises Other Exercises Other Exercises: Pt instructed in energy conservation strategies including activity pacing, work simplification, and pursed lip breathing to support recovery and minimize risk of SOB/over exertion. Pt verbalized understanding, occasionally requiring VC for PLB.   Shoulder Instructions      Home Living Family/patient expects to be discharged to:: Group home                                 Additional Comments: Pt reports living on 1st fl of 2 story group home with ramped entrance. Access to tub/shower.      Prior Functioning/Environment Level of Independence: Needs assistance  Gait / Transfers Assistance Needed: Indep with mobility, endorses 1 fall in Oct 2020 resulting in L rib fractures, no falls since ADL's / Homemaking Assistance Needed: Pt indep with basic ADL tasks, laundry; pt reports group home staff assist with medication mgt, meals, transportation, and housekeeping tasks            OT Problem List: Cardiopulmonary status limiting activity;Decreased activity tolerance;Decreased safety awareness      OT Treatment/Interventions: Self-care/ADL training;Therapeutic exercise;Therapeutic activities;Energy conservation;DME and/or AE instruction;Patient/family education    OT Goals(Current goals can be found in the care plan section) Acute Rehab OT Goals Patient Stated Goal: go back to group home OT Goal Formulation: With patient Time For Goal Achievement: 04/11/20 Potential to Achieve Goals: Good ADL Goals Additional ADL Goal #1: Pt will utilize learned ECS to support  participation in ADL and minimize SOB/over exertion with PRN verbal cues to initiate. Additional ADL Goal #2: Pt will verbalize plan to implement at least 1 learned falls prevention strategy to maximize safety during ADL and IADL tasks at his group home.  OT Frequency: Min 1X/week   Barriers to D/C:            Co-evaluation              AM-PAC OT "6 Clicks" Daily Activity     Outcome Measure Help from another person eating meals?: None Help from another person taking care of personal grooming?: None Help from another person toileting, which includes using toliet, bedpan, or urinal?: None Help from another person bathing (including washing, rinsing, drying)?: A Little Help from another person to put on and taking off regular upper body clothing?: None Help from another person to put on and taking off regular lower body clothing?: None 6 Click Score: 23  End of Session    Activity Tolerance: Patient tolerated treatment well Patient left: in bed;with call bell/phone within reach  OT Visit Diagnosis: Other abnormalities of gait and mobility (R26.89)                Time: 9150-4136 OT Time Calculation (min): 20 min Charges:  OT General Charges $OT Visit: 1 Visit OT Evaluation $OT Eval Low Complexity: 1 Low OT Treatments $Self Care/Home Management : 8-22 mins  Jeni Salles, MPH, MS, OTR/L ascom [phone] 03/28/20, 3:41 PM

## 2020-03-28 NOTE — Progress Notes (Addendum)
NAME:  Kenneth Mainwaring., MRN:  [id number], DOB:  07-13-1960, LOS: 2 ADMISSION DATE:  03/26/2020, CONSULTATION DATE:  03/26/2020 REFERRING MD:  Dr. Blaine Hamper, CHIEF COMPLAINT:  Shortness of breath  Brief History   59 y.o. Male admitted with Sepsis and Acute Hypoxic Hypercapnic Respiratory Failure in the setting of suspected Community Acquired Pneumonia vs. Pulmonary Edema, along with suspected undiagnosed COPD requiring BiPAP  Past Medical History  HTN Paranoid schizophrenia Bipolar disorder  Significant Hospital Events   10/12: Presented to ED; to be admitted to [facility] unit by Hospitalist 10/12: While in ED following IVF resuscitation, developed Acute Respiratory Distress and Somnolence, placed on BiPAP with PCCM consultation  10/13: More alert, PCO2 76, BIPAP PRN during the day, 3 L [place] overnight  Consults:    Procedures:    Significant Diagnostic Tests:  10/12: CXR>>1. Increased interstitial markings particularly in the LEFT lung base with small LEFT effusion versus pleural-parenchymal scarring. Findings could represent atypical infection versus asymmetric edema. 2. Small effusion versus pleural and parenchymal scarring at the LEFT lung base. 3. Fractures of ribs 7 through 10 on the LEFT posteriorly appear to show callus formation likely subacute or chronic. Correlate with any trauma history and any pain in this location, findings are new since 2012 without recent comparison. 10/12: 2D Echocardiogram>> LVEF 60-65%, no wall motion abnormalities, trivial mitral valve regurgitation 10/12: CTA Chest>>1. Bronchiolitis. Indeterminate etiology, possibly infectious or inflammatory. Consider trial therapy with follow-up HRCT as outpatient for follow-up. 2. A 1 x 1.4 cm solid pulmonary nodule. Recommend repeat CT in 3 months to evaluate stability. 3. Indeterminate right hilar lymphadenopathy that is likely reactive. Attention on follow-up.4. No pulmonary embolus.  Micro Data:  10/12:  SARS-CoV-2>> negative 10/12: Influenza PCR>> negative 10/12: Blood culture x2>> 10/12: Sputum >> 10/12: Strep pneumo urinary antigen>> negative 10/12: Legionella urinary antigen>> negative 10/13: Respiratory Panel PCR >>  Antimicrobials:  Azithromycin 10/12>> Ceftriaxone 10/12>>  Interim history/subjective:  Patient is sitting up in bed, watching TV.  His only complaint is the desire to go home and get back to his routine.  Discussed the need for completing his medication regimen and working him up for the possibility of home oxygen.  Labs/ Imaging personally reviewed Net: + 1.9 L since admit  BUN/Cr.: 16/ 0.57 Hgb: 13.2 PCT: < 0.10 TMAX/ WBC: 37.1/ 14.0  Objective   Blood pressure 121/69, pulse 90, temperature 98.6 F (37 C), temperature source Oral, resp. rate (!) 21, height 6' (1.829 m), weight 77.6 kg, SpO2 100 %.        Intake/Output Summary (Last 24 hours) at 03/28/2020 7169 Last data filed at 03/27/2020 1800 Gross per 24 hour  Intake 1400 ml  Output 1000 ml  Net 400 ml   Filed Weights   03/26/20 1002  Weight: 77.6 kg    Examination: General: Adult male, lying in bed, NAD HEENT: MM pink/moist, anicteric  Neuro: Alert & oriented, following commands, PERRL +3, MAE CV: s1s2 RRR, NSR on monitor, no r/m/g Pulm: Regular, non labored on 3 L [place] , breath sounds coarse-BUL & diminished-BLL GI: soft, non tender , bs x 4 GU: voiding with urinal, clear yellow urine Skin: intact with scattered ecchymosis no rashes/lesions noted Extremities: warm/dry, pulses + 2 R/P, no edema noted  Resolved Hospital Problem list     Assessment & Plan:  Acute Hypoxic Hypercapnic Respiratory Failure in the setting of suspected Community Acquired Pneumonia vs. Pulmonary Edema Suspect Undiagnosed COPD Para-mediastinal Pulmonary Nodule CTA Chest on 04/04/20 negative  for PE, was concerning for Bronchiolitis and a 1 x 1.4 cm solid pulmonary nodule. Patient has a 15 pack year history, and  has not been evaluated for lung disease- per pt report. - Supplemental O2 PRN to maintain SpO2 > 88% - BIPAP QHS  - intermittent CXR & ABG PRN - continue pulmicort BID, duonebs Q 4, additional bronchodilators PRN - Continue Azithromycin for COPD exacerbation, consider discontinuing Ceftriaxone  - was evaluated for home O2, dropped SpO2 to 85% on room air, qualifies for home O2 - F/u appointment made with Dr. Patsey Berthold at [facility] on 04/15/2020 at 1:30 pm - recommend prednisone 40 mg daily for 5 days & Zithromax at time of discharge  - f/u outpatient CT chest for evaluation of pulmonary nodule in 3 months  Sepsis secondary to suspected CAP - monitor WBC/ fever curve - follow cultures - continue Azithromycin for COPD exacerbation, consider d/c Ceftriaxone if cultures are negative   Best practice:  Diet: Cardiac Pain/Anxiety/Delirium protocol (if indicated): N/A VAP protocol (if indicated): N/A DVT prophylaxis: lovenox SQ GI prophylaxis: N/A Glucose control:  Mobility: mobilize as tolerated Code Status: FULL Family Communication: Patient updated bedside- 10/14 Disposition: SDU  Labs   CBC: Recent Labs  Lab 03/26/20 1006 03/27/20 0359 03/28/20 0428  WBC 17.6* 17.2* 14.0*  NEUTROABS 14.6*  --   --   HGB 15.2 13.8 13.2  HCT 46.3 43.4 41.5  MCV 92.2 94.6 94.5  PLT 234 224 301    Basic Metabolic Panel: Recent Labs  Lab 03/26/20 1006 03/27/20 0359 03/28/20 0428  NA 136 138 142  K 4.0 4.6 4.2  CL 92* 96* 97*  CO2 33* 33* 38*  GLUCOSE 134* 136* 117*  BUN 15 11 16   CREATININE 0.80 0.76 0.57*  CALCIUM 9.3 8.7* 8.8*   GFR: Estimated Creatinine Clearance: 110.5 mL/min (A) (by C-G formula based on SCr of 0.57 mg/dL (L)). Recent Labs  Lab 03/26/20 1004 03/26/20 1006 03/26/20 1356 03/27/20 0359 03/28/20 0428  PROCALCITON  --   --  <0.10 <0.10 <0.10  WBC  --  17.6*  --  17.2* 14.0*  LATICACIDVEN 1.6  --   --   --   --     Liver Function Tests: Recent Labs   Lab 03/26/20 1006 03/28/20 0428  AST 25 16  ALT 20 22  ALKPHOS 80 76  BILITOT 0.5 0.3  PROT 7.6 6.0*  ALBUMIN 3.8 2.9*   No results for input(s): LIPASE, AMYLASE in the last 168 hours. No results for input(s): AMMONIA in the last 168 hours.  ABG    Component Value Date/Time   PHART 7.34 (L) 03/27/2020 0832   PCO2ART 76 (HH) 03/27/2020 0832   PO2ART 58 (L) 03/27/2020 0832   HCO3 41.0 (H) 03/27/2020 0832   O2SAT 88.0 03/27/2020 0832     Coagulation Profile: No results for input(s): INR, PROTIME in the last 168 hours.  Cardiac Enzymes: No results for input(s): CKTOTAL, CKMB, CKMBINDEX, TROPONINI in the last 168 hours.  HbA1C: Hgb A1c MFr Bld  Date/Time Value Ref Range Status  03/15/2018 09:53 PM 5.5 4.8 - 5.6 % Final    Comment:    (NOTE)         Prediabetes: 5.7 - 6.4         Diabetes: >6.4         Glycemic control for adults with diabetes: <7.0     CBG: Recent Labs  Lab 03/26/20 2111 03/27/20 1552  GLUCAP 137* 104*  Critical care time:     Domingo Pulse Rust[place], AGACNP-BC [facility]    Please see Amion for pager details.

## 2020-03-28 NOTE — Evaluation (Addendum)
Physical Therapy Evaluation Patient Details Name: Kenneth [name]. MRN: [id number] DOB: 1960/06/16 Today's Date: 03/28/2020   History of Present Illness  Pt is a 59 y/o M admitted on 03/26/2020 with c/o SOB x 3 days& cough. Pt found to have CAP. Pt with PMH significant for HTN, bipolar, paranoid schizoprenia, tobacco abuse.  Clinical Impression  PT returned to complete evaluation & pt more pleasant & agreeable to tx. Pt ambulates around room with supervision with AD, standing at sink ~4 minutes to complete hand hygiene/clean nails. Pt reports SOB at times with SpO2 = 88-91% throughout session on 3L/min via nasal cannula. PT educates pt on pursed lip breathing. Pt with decreased awareness of oxygen tube management during functional mobility. Pt would benefit from continued skilled PT treatment to address balance & endurance deficits & to improve cardiopulmonary endurance.  Addendum: Pt declines further ambulation/exercises at this time.    Follow Up Recommendations Home health PT;Supervision - Intermittent    Equipment Recommendations  None recommended by PT    Recommendations for Other Services       Precautions / Restrictions Precautions Precautions: Fall Precaution Comments: monitor O2 sats; per MD note "wean as able" Restrictions Weight Bearing Restrictions: No      Mobility  Bed Mobility Overal bed mobility: Independent                Transfers Overall transfer level: Independent                  Ambulation/Gait Ambulation/Gait assistance: Supervision Gait Distance (Feet): 25 Feet Assistive device: None          Stairs            Wheelchair Mobility    Modified Rankin (Stroke Patients Only)       Balance Overall balance assessment: Mild deficits observed, not formally tested         Standing balance support: No upper extremity supported;During functional activity Standing balance-Leahy Scale: Good                                Pertinent Vitals/Pain Pain Assessment: No/denies pain    Home Living Family/patient expects to be discharged to:: Group home                 Additional Comments: Pt reports living on 1st fl of 2 story group home with ramped entrance. Access to tub/shower.    Prior Function Level of Independence: Needs assistance   Gait / Transfers Assistance Needed: Indep with mobility, endorses 1 fall in Oct 2020 resulting in L rib fractures, no falls since  ADL's / Homemaking Assistance Needed: Pt indep with basic ADL tasks, laundry; pt reports group home staff assist with medication mgt, meals, transportation, and housekeeping tasks        Hand Dominance   Dominant Hand: Right    Extremity/Trunk Assessment   Upper Extremity Assessment Upper Extremity Assessment: Overall WFL for tasks assessed    Lower Extremity Assessment Lower Extremity Assessment: Overall WFL for tasks assessed    Cervical / Trunk Assessment Cervical / Trunk Assessment: Normal  Communication   Communication: No difficulties  Cognition Arousal/Alertness: Awake/alert Behavior During Therapy: WFL for tasks assessed/performed Overall Cognitive Status: Within Functional Limits for tasks assessed  General Comments: alert and oriented, follows all commands      General Comments General comments (skin integrity, edema, etc.): pt requests to wash hands/clean nails standing at sink with pt electing to lean on windowsill at times.        Assessment/Plan    PT Assessment Patient needs continued PT services  PT Problem List Cardiopulmonary status limiting activity;Decreased mobility;Decreased activity tolerance;Decreased safety awareness       PT Treatment Interventions Functional mobility training;Balance training;Patient/family education;Neuromuscular re-education;Gait training;Therapeutic activities;Stair training;Therapeutic exercise    PT Goals  (Current goals can be found in the Care Plan section)  Acute Rehab PT Goals Patient Stated Goal: go back to group home PT Goal Formulation: With patient Time For Goal Achievement: 04/11/20 Potential to Achieve Goals: Good    Frequency Min 2X/week   Barriers to discharge Inaccessible home environment;Decreased caregiver support lives in group home    Co-evaluation               AM-PAC PT "6 Clicks" Mobility  Outcome Measure Help needed turning from your back to your side while in a flat bed without using bedrails?: None Help needed moving from lying on your back to sitting on the side of a flat bed without using bedrails?: None Help needed moving to and from a bed to a chair (including a wheelchair)?: A Little Help needed standing up from a chair using your arms (e.g., wheelchair or bedside chair)?: None Help needed to walk in hospital room?: A Little Help needed climbing 3-5 steps with a railing? : A Little 6 Click Score: 21    End of Session Equipment Utilized During Treatment: Oxygen Activity Tolerance: Patient tolerated treatment well;Patient limited by fatigue Patient left: in bed;with bed alarm set;with call bell/phone within reach   PT Visit Diagnosis: Difficulty in walking, not elsewhere classified (R26.2);Unsteadiness on feet (R26.81)    Time: 8088-1103 PT Time Calculation (min) (ACUTE ONLY): 11 min   Charges:   PT Evaluation $PT Eval Moderate Complexity: [place], PT, DPT 03/28/20, 4:34 PM

## 2020-03-28 NOTE — Progress Notes (Signed)
PT Cancellation Note  Patient Details Name: Kenneth [name]. MRN: [id number] DOB: [birth year]   Cancelled Treatment:    Reason Eval/Treat Not Completed: Patient declined, no reason specified   Attempted to see pt for PT evaluation. Pt received in bed on 3L/min supplemental oxygen via nasal cannula, SPO2 = 90% with pt c/o SOB with talking & PT educating him on pursed lip breathing. Pt does provide some PLOF/home set up information but then repeatedly states he wants to rest with pt ultimately ignoring therapist with eyes closed and not responding to questions. PT encouraged OOB mobility but pt only sits up to move coffee closer to him, returns supine & reports "I want to nap". Pt left in bed with alarm set, all needs at hand. Will attempt to see pt later to complete PT evaluation as pt is agreeable.   Waunita Schooner, PT, DPT 03/28/2020, 2:06 PM

## 2020-03-28 NOTE — Progress Notes (Addendum)
PROGRESS NOTE    [name]  NGE:[id number] DOB: [birth year] DOA: 03/26/2020 PCP: Patient, No Pcp Per  Outpatient Specialists: none    Brief Narrative:   Bejamin Hackbart. is a 59 y.o. male with medical history significant of hypertension, bipolar, paranoid schizophrenia, tobacco abuse, who presents with cough and shortness breath.  P has been having shortness of breath for more than 3 days, which gradually started and has been progressively worsening.  Patient has productive cough with yellow-colored sputum production.The patient states that he felt like he had a viral upper respiratory syndrome earlier in the weekend, and then developed the more severe shortness of breath in the last few days.  Patient denies chest pain, fever or chills.  Patient does not have nausea, vomiting, diarrhea, abdominal, symptoms of UTI or unilateral weakness.  Patient is calm.  No suicidal or homicidal ideations. He states he has been vaccinated for Covid.   Per report, patient oxygen desaturated to 60s on room air, patient was put on nonrebreather. Pt was given Solu-Medrol and DuoNeb nebulizers by EMS.  Oxygen saturation improved to 91-94% on 3 L oxygen in ED.   ED Course: pt was found to have WBC 17.6, negative COVID-19 PCR, lactic acid 1.6, electrolytes renal function okay, temperature normal, blood pressure 143/77, tachycardia with heart rate of 104, tachypnea with RR 34. I have personally reviewed the chest x-ray, which showed prominent interstitial marker in the left lung base with possible small left pleural effusion. Patient is admitted to [place] bed as inpatient.  In late afternoon of day of admission, patient developed worsening shortness of breath.  Auscultation showed diffuse crackles, mild wheezing and decreased air movement.  This happened after the patient received 2.5 L normal saline.  ABG with pH of 7.16, PCO2 100, PO2 72. Started on bipap, given lasix 20, critical care  consulted.  Assessment & Plan:   Principal Problem:   CAP (community acquired pneumonia) Active Problems:   Tobacco use disorder   Paranoid schizophrenia ([place])   Bipolar 1 disorder ([place])   Hypertension   Sepsis ([place])   Acute respiratory failure with hypoxia ([place])   Sepsis and acute respiratory failure with hypoxia due to possible CAP vs COPD exacerbation: Patient has productive cough, shortness of breath, chest x-ray showed prominent interstitial marking, though no obvious consolidation. Patient is smoker, currently smokes 3 cigarettes/day.  He has wheezing on auscultation, indicating possible undiagnosed COPD with exacerbation, and given normal trend of procalcitonin do now favor copd exacerbation.  Patient meets criteria for sepsis with leukocytosis, tachycardia and tachypnea. Worsening respiratory statuson 10/12, hypoxic and acidemic on abg, placed on bipap in the afternoon and given lasix 20, did not tolerate and self d/c'd in evening of 10/12. 10/13 wheezing and tachypnic, slowly improving throughout the day and night. This morning respiratory status much improved.  - transfer to progressive bed - cont IV Rocephin and azithromycin (10/12> ). Legionella antigen neg, strep antigen neg. Blood cultures ngtd, pt unable to express sputum for culture. Flu/covid neg. - Mucinex for cough  - duonebs, budesonide, solu-medrol (10/12>10/14 ). Start oral prednisone 40 qd tomorrow - F/u appointment made with Dr. Patsey Berthold at [facility] on 04/15/2020 at 1:30 pm - wean [place] O2 as able currently on 3 L - ordering pt/ot consult   Schizophrenia  On paliperidone IM q 28 days. Pt says last dose was on or around 03/20/20  Pulmonary nodule - will need outpt repeat CT in 3 mo  DVT prophylaxis: lovenox Code Status: full Family Communication: none at bedside  Status is: Inpatient  Remains inpatient appropriate because:Inpatient level of care appropriate due to severity of illness   Dispo: The  patient is from: Group home              Anticipated d/c is to: Group home              Anticipated d/c date is: 10/16              Patient currently is not medically stable to d/c.        Consultants:  Critical care  Procedures: TTE  Antimicrobials:  Ceftriaxone/azithromycin 10/12 >    Subjective: This morning says breathing is good. Denies dyspnea at rest. No fevers or chest pain. Has an appetite. Says feels close to baseline.   Objective: Vitals:   03/27/20 1951 03/27/20 2000 03/28/20 0000 03/28/20 0400  BP:    121/69  Pulse:      Resp:      Temp:  98.6 F (37 C) 98.8 F (37.1 C) 98.6 F (37 C)  TempSrc:  Oral Oral Oral  SpO2: 100%     Weight:      Height:        Intake/Output Summary (Last 24 hours) at 03/28/2020 0734 Last data filed at 03/27/2020 1800 Gross per 24 hour  Intake 1400 ml  Output 1000 ml  Net 400 ml   Filed Weights   03/26/20 1002  Weight: 77.6 kg    Examination:  General exam: Appears calm and comfortable  Respiratory system: rhonchi throughout, scattered exp wheeze. No tachypnea Cardiovascular system: S1 & S2 heard, soft systolic murmur Gastrointestinal system: Abdomen is nondistended, soft and nontender. No organomegaly or masses felt. Normal bowel sounds heard. Central nervous system: Alert and oriented. No focal neurological deficits. Extremities: Symmetric 5 x 5 power. Skin: No rashes, lesions or ulcers Psychiatry: alert and oriented    Data Reviewed: I have personally reviewed following labs and imaging studies  CBC: Recent Labs  Lab 03/26/20 1006 03/27/20 0359 03/28/20 0428  WBC 17.6* 17.2* 14.0*  NEUTROABS 14.6*  --   --   HGB 15.2 13.8 13.2  HCT 46.3 43.4 41.5  MCV 92.2 94.6 94.5  PLT 234 224 580   Basic Metabolic Panel: Recent Labs  Lab 03/26/20 1006 03/27/20 0359 03/28/20 0428  NA 136 138 142  K 4.0 4.6 4.2  CL 92* 96* 97*  CO2 33* 33* 38*  GLUCOSE 134* 136* 117*  BUN 15 11 16   CREATININE 0.80  0.76 0.57*  CALCIUM 9.3 8.7* 8.8*   GFR: Estimated Creatinine Clearance: 110.5 mL/min (A) (by C-G formula based on SCr of 0.57 mg/dL (L)). Liver Function Tests: Recent Labs  Lab 03/26/20 1006 03/28/20 0428  AST 25 16  ALT 20 22  ALKPHOS 80 76  BILITOT 0.5 0.3  PROT 7.6 6.0*  ALBUMIN 3.8 2.9*   No results for input(s): LIPASE, AMYLASE in the last 168 hours. No results for input(s): AMMONIA in the last 168 hours. Coagulation Profile: No results for input(s): INR, PROTIME in the last 168 hours. Cardiac Enzymes: No results for input(s): CKTOTAL, CKMB, CKMBINDEX, TROPONINI in the last 168 hours. BNP (last 3 results) No results for input(s): PROBNP in the last 8760 hours. HbA1C: No results for input(s): HGBA1C in the last 72 hours. CBG: Recent Labs  Lab 03/26/20 2111 03/27/20 1552  GLUCAP 137* 104*   Lipid Profile: No results for input(s):  CHOL, HDL, LDLCALC, TRIG, CHOLHDL, LDLDIRECT in the last 72 hours. Thyroid Function Tests: No results for input(s): TSH, T4TOTAL, FREET4, T3FREE, THYROIDAB in the last 72 hours. Anemia Panel: No results for input(s): VITAMINB12, FOLATE, FERRITIN, TIBC, IRON, RETICCTPCT in the last 72 hours. Urine analysis:    Component Value Date/Time   COLORURINE AMBER (A) 03/17/2018 0641   APPEARANCEUR CLEAR (A) 03/17/2018 0641   APPEARANCEUR Hazy 05/13/2013 1142   LABSPEC 1.027 03/17/2018 0641   LABSPEC 1.017 05/13/2013 1142   PHURINE 6.0 03/17/2018 0641   GLUCOSEU NEGATIVE 03/17/2018 0641   GLUCOSEU Negative 05/13/2013 1142   HGBUR NEGATIVE 03/17/2018 0641   BILIRUBINUR NEGATIVE 03/17/2018 0641   BILIRUBINUR Negative 05/13/2013 1142   KETONESUR NEGATIVE 03/17/2018 0641   PROTEINUR 30 (A) 03/17/2018 0641   NITRITE NEGATIVE 03/17/2018 0641   LEUKOCYTESUR NEGATIVE 03/17/2018 0641   LEUKOCYTESUR Negative 05/13/2013 1142   Sepsis Labs: @LABRCNTIP (procalcitonin:4,lacticidven:4)  ) Recent Results (from the past 240 hour(s))  Respiratory Panel  by RT PCR (Flu A&B, Covid) - Nasopharyngeal Swab     Status: None   Collection Time: 03/26/20 10:06 AM   Specimen: Nasopharyngeal Swab  Result Value Ref Range Status   SARS Coronavirus 2 by RT PCR NEGATIVE NEGATIVE Final    Comment: (NOTE) SARS-CoV-2 target nucleic acids are NOT DETECTED.  The SARS-CoV-2 RNA is generally detectable in upper respiratoy specimens during the acute phase of infection. The lowest concentration of SARS-CoV-2 viral copies this assay can detect is 131 copies/mL. A negative result does not preclude SARS-Cov-2 infection and should not be used as the sole basis for treatment or other patient management decisions. A negative result may occur with  improper specimen collection/handling, submission of specimen other than nasopharyngeal swab, presence of viral mutation(s) within the areas targeted by this assay, and inadequate number of viral copies (<131 copies/mL). A negative result must be combined with clinical observations, patient history, and epidemiological information. The expected result is Negative.  Fact Sheet for Patients:  PinkCheek.be  Fact Sheet for Healthcare Providers:  GravelBags.it  This test is no t yet approved or cleared by the [place] FDA and  has been authorized for detection and/or diagnosis of SARS-CoV-2 by FDA under an Emergency Use Authorization (EUA). This EUA will remain  in effect (meaning this test can be used) for the duration of the COVID-19 declaration under Section 564(b)(1) of the Act, 21 U.S.C. section 360bbb-3(b)(1), unless the authorization is terminated or revoked sooner.     Influenza A by PCR NEGATIVE NEGATIVE Final   Influenza B by PCR NEGATIVE NEGATIVE Final    Comment: (NOTE) The Xpert Xpress SARS-CoV-2/FLU/RSV assay is intended as an aid in  the diagnosis of influenza from Nasopharyngeal swab specimens and  should not be used as a sole basis for  treatment. Nasal washings and  aspirates are unacceptable for Xpert Xpress SARS-CoV-2/FLU/RSV  testing.  Fact Sheet for Patients: PinkCheek.be  Fact Sheet for Healthcare Providers: GravelBags.it  This test is not yet approved or cleared by the [place] FDA and  has been authorized for detection and/or diagnosis of SARS-CoV-2 by  FDA under an Emergency Use Authorization (EUA). This EUA will remain  in effect (meaning this test can be used) for the duration of the  Covid-19 declaration under Section 564(b)(1) of the Act, 21  U.S.C. section 360bbb-3(b)(1), unless the authorization is  terminated or revoked. Performed at [facility], [address], [place], [place] [address]   Culture, blood (Routine X 2)  w Reflex to ID Panel     Status: None (Preliminary result)   Collection Time: 03/26/20 12:51 PM   Specimen: BLOOD  Result Value Ref Range Status   Specimen Description BLOOD RIGHT HAND  Final   Special Requests   Final    BOTTLES DRAWN AEROBIC AND ANAEROBIC Blood Culture results may not be optimal due to an inadequate volume of blood received in culture bottles   Culture   Final    NO GROWTH 2 DAYS Performed at [facility], [address], [place], [place] [address]    Report Status PENDING  Incomplete  Culture, blood (Routine X 2) w Reflex to ID Panel     Status: None (Preliminary result)   Collection Time: 03/26/20 12:51 PM   Specimen: BLOOD  Result Value Ref Range Status   Specimen Description BLOOD RIGHT ASSIST CONTROL  Final   Special Requests   Final    BOTTLES DRAWN AEROBIC AND ANAEROBIC Blood Culture results may not be optimal due to an inadequate volume of blood received in culture bottles   Culture   Final    NO GROWTH 2 DAYS Performed at [facility], [name], [place], [place] [address]    Report Status PENDING  Incomplete         Radiology Studies: CT ANGIO  CHEST PE W OR WO CONTRAST  Result Date: 03/26/2020 CLINICAL DATA:  SHOB/productive cough since Sunday. EXAM: CT ANGIOGRAPHY CHEST WITH CONTRAST TECHNIQUE: Multidetector CT imaging of the chest was performed using the standard protocol during bolus administration of intravenous contrast. Multiplanar CT image reconstructions and MIPs were obtained to evaluate the vascular anatomy. CONTRAST:  112mL OMNIPAQUE IOHEXOL 350 MG/ML SOLN COMPARISON:  Chest x-ray 03/26/2020. chest x-ray 06/03/2011 FINDINGS: Cardiovascular: Satisfactory opacification of the pulmonary arteries to the segmental level. No evidence of pulmonary embolism. Normal heart size. No pericardial effusion. Mediastinum/Nodes: There is a an enlarged right hilar lymph node measuring up to 1.9 cm in short axis. No enlarged mediastinal or axillary lymph nodes. The thyroid and esophagus demonstrate no significant findings. Debris within the trachea. Lungs/Pleura: Diffuse, with lower lobe predominance, small centrilobular nodules and tree in bud appearance. Suggestion of cavitation within the right upper and lower lobe (6: 54, 51-52). Similar possible cavitary findings within the left upper lobe (6:21). There is a paramediastinal 1 x 1.4 cm solid pulmonary nodule (4:60). Left base scarring. Upper Abdomen: No acute abnormality. Musculoskeletal: No chest wall abnormality No suspicious lytic or blastic osseous lesions. No acute displaced fracture. Old healed left rib fractures. Multilevel degenerative changes of the spine. Review of the MIP images confirms the above findings. IMPRESSION: 1. Bronchiolitis. Indeterminate etiology, possibly infectious or inflammatory. Consider trial therapy with follow-up HRCT as outpatient for follow-up. 2. A 1 x 1.4 cm solid pulmonary nodule. Recommend repeat CT in 3 months to evaluate stability. 3. Indeterminate right hilar lymphadenopathy that is likely reactive. Attention on follow-up. 4. No pulmonary embolus. Electronically  Signed   By: Iven Finn M.D.   On: 03/26/2020 21:27   DG Chest Port 1 View  Result Date: 03/27/2020 CLINICAL DATA:  Dyspnea.  Shortness of breath and cough. EXAM: PORTABLE CHEST 1 VIEW COMPARISON:  03/26/2020 FINDINGS: Normal heart size. Pleuroparenchymal scarring with blunting of the left costophrenic angle is again noted. Diffuse reticulonodular opacities are noted throughout both lungs. No lobar consolidation or atelectasis. No acute bone abnormality noted. Remote left posterior rib fractures. IMPRESSION: 1. Persistent diffuse bilateral reticular and nodular opacities compatible  with inflammatory/infectious bronchiolitis. 2. Chronic left pleural thickening and scarring. Electronically Signed   By: Kerby Moors M.D.   On: 03/27/2020 10:20   DG Chest Portable 1 View  Result Date: 03/26/2020 CLINICAL DATA:  Shortness of breath, productive cough since Sunday EXAM: PORTABLE CHEST 1 VIEW COMPARISON:  Examination from 20/12 FINDINGS: Trachea midline.  Cardiomediastinal contours are normal. No lobar consolidation. Increased interstitial markings particularly in the LEFT lung base. Suggestion of LEFT-sided effusion versus pleural-parenchymal scarring Fractures of ribs 7 through 10 on the LEFT posteriorly appear to show callus formation likely subacute or chronic. These were not present on the prior study. IMPRESSION: 1. Increased interstitial markings particularly in the LEFT lung base with small LEFT effusion versus pleural-parenchymal scarring. Findings could represent atypical infection versus asymmetric edema. 2. Small effusion versus pleural and parenchymal scarring at the LEFT lung base. 3. Fractures of ribs 7 through 10 on the LEFT posteriorly appear to show callus formation likely subacute or chronic. Correlate with any trauma history and any pain in this location, findings are new since 2012 without recent comparison. Electronically Signed   By: Geoffrey  Wile M.D.   On: 03/26/2020 10:30    ECHOCARDIOGRAM COMPLETE  Result Date: 03/27/2020    ECHOCARDIOGRAM REPORT   Patient Name:   Demarkus E Nordgren Jr. Date of Exam: 03/26/2020 Medical Rec #:  [id number]           Height:       72.0 in Accession #:    [id number]          Weight:       171.0 lb Date of Birth:  07/16/1960            BSA:          1.993 m Patient Age:    59 years            BP:           140/88 mmHg Patient Gender: M                   HR:           90  bpm. Exam Location:  [facility] Procedure: 2D Echo, Cardiac Doppler and Color Doppler Indications:     V88.67 Acute Systolic CHF  History:         Patient has no prior history of Echocardiogram examinations.                  Risk Factors:Hypertension.  Sonographer:     Wilford Sports Rodgers-Jones Referring Phys:  Unknown Foley NIU Diagnosing Phys: Nelva Bush MD IMPRESSIONS  1. Left ventricular ejection fraction, by estimation, is 60 to 65%. The left ventricle has normal function. The left ventricle has no regional wall motion abnormalities. Left ventricular diastolic parameters were normal.  2. Right ventricular systolic function is normal. The right ventricular size is mildly enlarged. There is mildly elevated pulmonary artery systolic pressure.  3. The mitral valve is normal in structure. Trivial mitral valve regurgitation. No evidence of mitral stenosis.  4. The aortic valve has an indeterminant number of cusps. Aortic valve regurgitation is not visualized. No aortic stenosis is present.  5. The inferior vena cava is dilated in size with >50% respiratory variability, suggesting right atrial pressure of 8 mmHg. FINDINGS  Left Ventricle: Left ventricular ejection fraction, by estimation, is 60 to 65%. The left ventricle has normal function. The left ventricle has no regional wall motion abnormalities. The left ventricular internal cavity size  was normal in size. There is  no left ventricular hypertrophy. Left ventricular diastolic parameters were normal. Right Ventricle: The right ventricular size  is mildly enlarged. No increase in right ventricular wall thickness. Right ventricular systolic function is normal. There is mildly elevated pulmonary artery systolic pressure. The tricuspid regurgitant velocity is 2.99 m/s, and with an assumed right atrial pressure of 8 mmHg, the estimated right ventricular systolic pressure is 09.8 mmHg. Left Atrium: Left atrial size was normal in size. Right Atrium: Right atrial size was normal in size. Pericardium: There is no evidence of pericardial effusion. Mitral Valve: The mitral valve is normal in structure. Trivial mitral valve regurgitation. No evidence of mitral valve stenosis. Tricuspid Valve: The tricuspid valve is normal in structure. Tricuspid valve regurgitation is trivial. Aortic Valve: The aortic valve has an indeterminant number of cusps. Aortic valve regurgitation is not visualized. No aortic stenosis is present. Pulmonic Valve: The pulmonic valve was not well visualized. Pulmonic valve regurgitation is not visualized. No evidence of pulmonic stenosis. Aorta: The aortic root is normal in size and structure. Pulmonary Artery: The pulmonary artery is not well seen. Venous: The inferior vena cava is dilated in size with greater than 50% respiratory variability, suggesting right atrial pressure of 8 mmHg. IAS/Shunts: No atrial level shunt detected by color flow Doppler.  LEFT VENTRICLE PLAX 2D LVIDd:         4.58 cm  Diastology LVIDs:         2.78 cm  LV e' medial:    11.70 cm/s LV PW:         0.82 cm  LV E/e' medial:  10.7 LV IVS:        0.60 cm  LV e' lateral:   12.60 cm/s LVOT diam:     2.30 cm  LV E/e' lateral: 9.9 LV SV:         116 LV SV Index:   58 LVOT Area:     4.15 cm  RIGHT VENTRICLE             IVC RV Basal diam:  4.80 cm     IVC diam: 1.91 cm RV S prime:     19.20 cm/s TAPSE (M-mode): 2.9 cm LEFT ATRIUM           Index       RIGHT ATRIUM           Index LA diam:      3.40 cm 1.71 cm/m  RA Area:     16.40 cm LA Vol (A2C): 54.5 ml 27.34 ml/m RA  Volume:   49.70 ml  24.93 ml/m LA Vol (A4C): 37.1 ml 18.61 ml/m  AORTIC VALVE LVOT Vmax:   152.00 cm/s LVOT Vmean:  105.000 cm/s LVOT VTI:    0.278 m  AORTA Ao Root diam: 3.40 cm MITRAL VALVE                TRICUSPID VALVE MV Area (PHT): 4.06 cm     TR Peak grad:   35.8 mmHg MV Decel Time: 187 msec     TR Vmax:        299.00 cm/s MV E velocity: 125.00 cm/s MV A velocity: 103.00 cm/s  SHUNTS MV E/A ratio:  1.21         Systemic VTI:  0.28 m                             Systemic  Diam: 2.30 cm Nelva Bush MD Electronically signed by Nelva Bush MD Signature Date/Time: 03/27/2020/9:01:21 AM    Final         Scheduled Meds: . budesonide (PULMICORT) nebulizer solution  0.5 mg Nebulization BID  . Chlorhexidine Gluconate Cloth  6 each Topical Daily  . enoxaparin (LOVENOX) injection  40 mg Subcutaneous Q24H  . ipratropium-albuterol  3 mL Nebulization Q6H  . methylPREDNISolone (SOLU-MEDROL) injection  40 mg Intravenous Q12H  . nicotine  21 mg Transdermal Daily   Continuous Infusions: . azithromycin Stopped (03/27/20 1314)  . cefTRIAXone (ROCEPHIN)  IV Stopped (03/27/20 1158)     LOS: 2 days    Time spent: 20 min    Desma Maxim, MD [facility]   If 7PM-7AM, please contact night-coverage [email or web address] Password [facility] 03/28/2020, 7:34 AM

## 2020-03-28 NOTE — Progress Notes (Signed)
Pt became agitated and restless, removed all dx leads and tubing. Requested to leave AMA. Dr. Si Raider notified, in speaking with pt at this time.

## 2020-03-28 NOTE — Progress Notes (Signed)
Pt agreed to stay, agreed to wear n/c but refusing BP cuff and other monitoring devices. Sitting at bedside, calm and otherwise cooperative.

## 2020-03-28 NOTE — Progress Notes (Signed)
Patient rested quietly all afternoon. Ate his meals without issues.

## 2020-03-29 LAB — COMPREHENSIVE METABOLIC PANEL
ALT: 31 U/L (ref 0–44)
AST: 20 U/L (ref 15–41)
Albumin: 3.4 g/dL — ABNORMAL LOW (ref 3.5–5.0)
Alkaline Phosphatase: 90 U/L (ref 38–126)
Anion gap: 7 (ref 5–15)
BUN: 14 mg/dL (ref 6–20)
CO2: 36 mmol/L — ABNORMAL HIGH (ref 22–32)
Calcium: 9 mg/dL (ref 8.9–10.3)
Chloride: 96 mmol/L — ABNORMAL LOW (ref 98–111)
Creatinine, Ser: 0.81 mg/dL (ref 0.61–1.24)
GFR, Estimated: >60 mL/min (ref >60)
Glucose, Bld: 106 mg/dL — ABNORMAL HIGH (ref 70–99)
Potassium: 4.1 mmol/L (ref 3.5–5.1)
Sodium: 139 mmol/L (ref 135–145)
Total Bilirubin: 0.5 mg/dL (ref 0.3–1.2)
Total Protein: 6.8 g/dL (ref 6.5–8.1)

## 2020-03-29 LAB — CBC
HCT: 47.1 % (ref 39.0–52.0)
Hemoglobin: 15.1 g/dL (ref 13.0–17.0)
MCH: 30.4 pg (ref 26.0–34.0)
MCHC: 32.1 g/dL (ref 30.0–36.0)
MCV: 95 fL (ref 80.0–100.0)
Platelets: 263 10*3/uL (ref 150–400)
RBC: 4.96 MIL/uL (ref 4.22–5.81)
RDW: 12.6 % (ref 11.5–15.5)
WBC: 12.7 10*3/uL — ABNORMAL HIGH (ref 4.0–10.5)
nRBC: 0 % (ref 0.0–0.2)

## 2020-03-29 MED ORDER — SPIRIVA HANDIHALER 18 MCG IN CAPS: 18.0000 ug | ORAL_CAPSULE | Freq: Every day | RESPIRATORY_TRACT | 2 refills | Status: DC

## 2020-03-29 NOTE — NC FL2 (Signed)
[place] LEVEL OF CARE SCREENING TOOL     IDENTIFICATION  Patient Name: Kenneth [name]. Birthdate: 1961/03/21 Sex: male Admission Date (Current Location): 03/26/2020  [place] and [place] Number:  Engineering geologist and Address:  [facility], [address] [place], [place] [address]      Provider Number: [id number]  Attending Physician Name and Address:  Gwynne Edinger, MD  Relative Name and Phone Number:  Saha,JENNIFER A (Sister) [phone]    Current Level of Care: Hospital Recommended Level of Care: Other (Comment) (Group Home) Prior Approval Number:    Date Approved/Denied:   PASRR Number:    Discharge Plan: Other (Comment) (Group Home)    Current Diagnoses: Patient Active Problem List   Diagnosis Date Noted  . Acute respiratory failure with hypoxia ([place]) 03/26/2020  . Paranoid schizophrenia ([place])   . Bipolar 1 disorder ([place])   . Hypertension   . CAP (community acquired pneumonia)   . Sepsis ([place])   . Schizophrenia, undifferentiated ([place]) 03/16/2018  . Noncompliance 03/16/2018  . Tobacco use disorder 03/16/2018    Orientation RESPIRATION BLADDER Height & Weight     Self, Time, Situation, Place  Normal Continent Weight: 171 lb (77.6 kg) Height:  6' (182.9 cm)  BEHAVIORAL SYMPTOMS/MOOD NEUROLOGICAL BOWEL NUTRITION STATUS      Continent Diet (heart diet, thin liquids)  AMBULATORY STATUS COMMUNICATION OF NEEDS Skin   Independent Verbally Normal                       Personal Care Assistance Level of Assistance   (Supervision for safety with ADLS)           Functional Limitations Info             SPECIAL CARE FACTORS FREQUENCY  PT (By licensed PT), OT (By licensed OT) ([place])                    Contractures      Additional Factors Info  Code Status, Allergies Code Status Info: Full Code Allergies Info: NKA           Current Medications (03/29/2020):  This  is the current hospital active medication list Current Facility-Administered Medications  Medication Dose Route Frequency Provider Last Rate Last Admin  . acetaminophen (TYLENOL) tablet 650 mg  650 mg Oral Q6H PRN Ivor Costa, MD      . albuterol (PROVENTIL) (2.5 MG/3ML) 0.083% nebulizer solution 2.5 mg  2.5 mg Nebulization Q4H PRN Ivor Costa, MD   2.5 mg at 03/26/20 1722  . azithromycin (ZITHROMAX) tablet 250 mg  250 mg Oral Daily Gwynne Edinger, MD   250 mg at 03/29/20 1016  . budesonide (PULMICORT) nebulizer solution 0.5 mg  0.5 mg Nebulization BID Darel Hong D, NP   0.5 mg at 03/29/20 4709  . cefTRIAXone (ROCEPHIN) 2 g in sodium chloride 0.9 % 100 mL IVPB  2 g Intravenous Q24H Ivor Costa, MD 200 mL/hr at 03/29/20 1138 2 g at 03/29/20 1138  . Chlorhexidine Gluconate Cloth 2 % PADS 6 each  6 each Topical Daily Gwynne Edinger, MD   6 each at 03/27/20 2146  . dextromethorphan-guaiFENesin (MUCINEX DM) 30-600 MG per 12 hr tablet 1 tablet  1 tablet Oral BID PRN Ivor Costa, MD      . enoxaparin (LOVENOX) injection 40 mg  40 mg Subcutaneous Q24H Ivor Costa, MD   40 mg  at 03/28/20 1630  . hydrALAZINE (APRESOLINE) injection 5 mg  5 mg Intravenous Q2H PRN Ivor Costa, MD      . ipratropium-albuterol (DUONEB) 0.5-2.5 (3) MG/3ML nebulizer solution 3 mL  3 mL Nebulization TID Si Raider Ailene Rud, MD   3 mL at 03/29/20 1415  . LORazepam (ATIVAN) injection 0.5 mg  0.5 mg Intravenous Q8H PRN Ivor Costa, MD   0.5 mg at 03/26/20 1916  . morphine 2 MG/ML injection 2 mg  2 mg Intravenous Q1H PRN Rust-[place], Britton L, NP      . nicotine (NICODERM CQ - dosed in mg/24 hours) patch 21 mg  21 mg Transdermal Daily Ivor Costa, MD      . ondansetron [facility]) injection 4 mg  4 mg Intravenous Q8H PRN Ivor Costa, MD      . predniSONE (DELTASONE) tablet 40 mg  40 mg Oral Q breakfast Gwynne Edinger, MD   40 mg at 03/29/20 2423     Discharge Medications: Please see discharge summary for a list of discharge  medications.  Relevant Imaging Results:  Relevant Lab Results:   Additional Information SSN [id number]  Xabi Wittler E Evelean Bigler, LCSW

## 2020-03-29 NOTE — Progress Notes (Signed)
PROGRESS NOTE    Kenneth [name].  WPY:[id number] DOB: 26-Oct-1960 DOA: 03/26/2020 PCP: Patient, No Pcp Per  Outpatient Specialists: none    Brief Narrative:   Kenneth [name]. is a 59 y.o. male with medical history significant of hypertension, bipolar, paranoid schizophrenia, tobacco abuse, who presents with cough and shortness breath.  P has been having shortness of breath for more than 3 days, which gradually started and has been progressively worsening.  Patient has productive cough with yellow-colored sputum production.The patient states that he felt like he had a viral upper respiratory syndrome earlier in the weekend, and then developed the more severe shortness of breath in the last few days.  Patient denies chest pain, fever or chills.  Patient does not have nausea, vomiting, diarrhea, abdominal, symptoms of UTI or unilateral weakness.  Patient is calm.  No suicidal or homicidal ideations. He states he has been vaccinated for Covid.   Per report, patient oxygen desaturated to 60s on room air, patient was put on nonrebreather. Pt was given Solu-Medrol and DuoNeb nebulizers by EMS.  Oxygen saturation improved to 91-94% on 3 L oxygen in ED.   ED Course: pt was found to have WBC 17.6, negative COVID-19 PCR, lactic acid 1.6, electrolytes renal function okay, temperature normal, blood pressure 143/77, tachycardia with heart rate of 104, tachypnea with RR 34. I have personally reviewed the chest x-ray, which showed prominent interstitial marker in the left lung base with possible small left pleural effusion. Patient is admitted to [place] bed as inpatient.  In late afternoon of day of admission, patient developed worsening shortness of breath.  Auscultation showed diffuse crackles, mild wheezing and decreased air movement.  This happened after the patient received 2.5 L normal saline.  ABG with pH of 7.16, PCO2 100, PO2 72. Started on bipap, given lasix 20, critical care  consulted.  Assessment & Plan:   Principal Problem:   CAP (community acquired pneumonia) Active Problems:   Tobacco use disorder   Paranoid schizophrenia ([place])   Bipolar 1 disorder ([place])   Hypertension   Sepsis ([place])   Acute respiratory failure with hypoxia ([place])   Sepsis and acute respiratory failure with hypoxia due to possible CAP vs COPD exacerbation: Patient has productive cough, shortness of breath, chest x-ray showed prominent interstitial marking, though no obvious consolidation. Patient is smoker, currently smokes 3 cigarettes/day.  He has wheezing on auscultation, indicating possible undiagnosed COPD with exacerbation, and given normal trend of procalcitonin do now favor copd exacerbation.  Patient meets criteria for sepsis with leukocytosis, tachycardia and tachypnea. Worsening respiratory statuson 10/12, hypoxic and acidemic on abg, placed on bipap in the afternoon and given lasix 20, did not tolerate and self d/c'd in evening of 10/12. 10/13 wheezing and tachypnic, slowly improving throughout the day and night. This morning much improved. Has refused oxygen, is breathing comfortably off it. - cont IV Rocephin and azithromycin (10/12> ). Legionella antigen neg, strep antigen neg. Blood cultures ngtd, pt unable to express sputum for culture. Flu/covid neg. Will discharge on oral azithromycin to continue 5 day course - Mucinex for cough  - duonebs, budesonide, solu-medrol (10/12>10/14). On oral prednisone with plan for 5-7 day course - F/u appointment made with Dr. Patsey Berthold at [facility] on 04/15/2020 at 1:30 pm - PT/OT no barriers to discharge - having nursing conduct O2 assessment    Schizophrenia  On paliperidone IM q 28 days. Pt says last dose was on or around 03/20/20  Pulmonary nodule -  will need outpt repeat CT in 3 mo   DVT prophylaxis: lovenox Code Status: full Family Communication: none at bedside  Status is: Inpatient  Remains inpatient appropriate  because:Unsafe d/c plan and Inpatient level of care appropriate due to severity of illness   Dispo: The patient is from: Group home              Anticipated d/c is to: Group home              Anticipated d/c date is: 10/16              Patient currently is medically stable to d/c., awaiting approval from group home to return      Consultants:  Critical care signed off  Procedures: TTE  Antimicrobials:  Ceftriaxone/azithromycin 10/12 >    Subjective: This morning says breathing is good. Denies dyspnea at rest. No fevers or chest pain. Has an appetite. Says feels like normal self.  Objective: Vitals:   03/29/20 0832 03/29/20 0900 03/29/20 1000 03/29/20 1418  BP:      Pulse: 87 82 (!) 108   Resp: (!) 24 18 (!) 25   Temp:      TempSrc:      SpO2: 91% 95% 94% 94%  Weight:      Height:        Intake/Output Summary (Last 24 hours) at 03/29/2020 1425 Last data filed at 03/29/2020 1049 Gross per 24 hour  Intake --  Output 801 ml  Net -801 ml   Filed Weights   03/26/20 1002  Weight: 77.6 kg    Examination:  General exam: Appears calm and comfortable  Respiratory system: rhonchi throughout, scattered exp wheeze. No tachypnea. Improved from prior. Cardiovascular system: S1 & S2 heard, soft systolic murmur Gastrointestinal system: Abdomen is nondistended, soft and nontender. No organomegaly or masses felt. Normal bowel sounds heard. Central nervous system: Alert and oriented. No focal neurological deficits. Extremities: Symmetric 5 x 5 power. Skin: No rashes, lesions or ulcers Psychiatry: alert and oriented    Data Reviewed: I have personally reviewed following labs and imaging studies  CBC: Recent Labs  Lab 03/26/20 1006 03/27/20 0359 03/28/20 0428 03/29/20 0623  WBC 17.6* 17.2* 14.0* 12.7*  NEUTROABS 14.6*  --   --   --   HGB 15.2 13.8 13.2 15.1  HCT 46.3 43.4 41.5 47.1  MCV 92.2 94.6 94.5 95.0  PLT 234 224 239 811   Basic Metabolic Panel: Recent  Labs  Lab 03/26/20 1006 03/27/20 0359 03/28/20 0428 03/29/20 0623  NA 136 138 142 139  K 4.0 4.6 4.2 4.1  CL 92* 96* 97* 96*  CO2 33* 33* 38* 36*  GLUCOSE 134* 136* 117* 106*  BUN 15 11 16 14   CREATININE 0.80 0.76 0.57* 0.81  CALCIUM 9.3 8.7* 8.8* 9.0   GFR: Estimated Creatinine Clearance: 109.1 mL/min (by C-G formula based on SCr of 0.81 mg/dL). Liver Function Tests: Recent Labs  Lab 03/26/20 1006 03/28/20 0428 03/29/20 0623  AST 25 16 20   ALT 20 22 31   ALKPHOS 80 76 90  BILITOT 0.5 0.3 0.5  PROT 7.6 6.0* 6.8  ALBUMIN 3.8 2.9* 3.4*   No results for input(s): LIPASE, AMYLASE in the last 168 hours. No results for input(s): AMMONIA in the last 168 hours. Coagulation Profile: No results for input(s): INR, PROTIME in the last 168 hours. Cardiac Enzymes: No results for input(s): CKTOTAL, CKMB, CKMBINDEX, TROPONINI in the last 168 hours. BNP (last 3 results) No  results for input(s): PROBNP in the last 8760 hours. HbA1C: No results for input(s): HGBA1C in the last 72 hours. CBG: Recent Labs  Lab 03/26/20 2111 03/27/20 1552  GLUCAP 137* 104*   Lipid Profile: No results for input(s): CHOL, HDL, LDLCALC, TRIG, CHOLHDL, LDLDIRECT in the last 72 hours. Thyroid Function Tests: No results for input(s): TSH, T4TOTAL, FREET4, T3FREE, THYROIDAB in the last 72 hours. Anemia Panel: No results for input(s): VITAMINB12, FOLATE, FERRITIN, TIBC, IRON, RETICCTPCT in the last 72 hours. Urine analysis:    Component Value Date/Time   COLORURINE AMBER (A) 03/17/2018 0641   APPEARANCEUR CLEAR (A) 03/17/2018 0641   APPEARANCEUR Hazy 05/13/2013 1142   LABSPEC 1.027 03/17/2018 0641   LABSPEC 1.017 05/13/2013 1142   PHURINE 6.0 03/17/2018 0641   GLUCOSEU NEGATIVE 03/17/2018 0641   GLUCOSEU Negative 05/13/2013 1142   HGBUR NEGATIVE 03/17/2018 0641   BILIRUBINUR NEGATIVE 03/17/2018 0641   BILIRUBINUR Negative 05/13/2013 1142   KETONESUR NEGATIVE 03/17/2018 0641   PROTEINUR 30 (A)  03/17/2018 0641   NITRITE NEGATIVE 03/17/2018 0641   LEUKOCYTESUR NEGATIVE 03/17/2018 0641   LEUKOCYTESUR Negative 05/13/2013 1142   Sepsis Labs: @LABRCNTIP (procalcitonin:4,lacticidven:4)  ) Recent Results (from the past 240 hour(s))  Respiratory Panel by RT PCR (Flu A&B, Covid) - Nasopharyngeal Swab     Status: None   Collection Time: 03/26/20 10:06 AM   Specimen: Nasopharyngeal Swab  Result Value Ref Range Status   SARS Coronavirus 2 by RT PCR NEGATIVE NEGATIVE Final    Comment: (NOTE) SARS-CoV-2 target nucleic acids are NOT DETECTED.  The SARS-CoV-2 RNA is generally detectable in upper respiratoy specimens during the acute phase of infection. The lowest concentration of SARS-CoV-2 viral copies this assay can detect is 131 copies/mL. A negative result does not preclude SARS-Cov-2 infection and should not be used as the sole basis for treatment or other patient management decisions. A negative result may occur with  improper specimen collection/handling, submission of specimen other than nasopharyngeal swab, presence of viral mutation(s) within the areas targeted by this assay, and inadequate number of viral copies (<131 copies/mL). A negative result must be combined with clinical observations, patient history, and epidemiological information. The expected result is Negative.  Fact Sheet for Patients:  PinkCheek.be  Fact Sheet for Healthcare Providers:  GravelBags.it  This test is no t yet approved or cleared by the [place] FDA and  has been authorized for detection and/or diagnosis of SARS-CoV-2 by FDA under an Emergency Use Authorization (EUA). This EUA will remain  in effect (meaning this test can be used) for the duration of the COVID-19 declaration under Section 564(b)(1) of the Act, 21 U.S.C. section 360bbb-3(b)(1), unless the authorization is terminated or revoked sooner.     Influenza A by PCR  NEGATIVE NEGATIVE Final   Influenza B by PCR NEGATIVE NEGATIVE Final    Comment: (NOTE) The Xpert Xpress SARS-CoV-2/FLU/RSV assay is intended as an aid in  the diagnosis of influenza from Nasopharyngeal swab specimens and  should not be used as a sole basis for treatment. Nasal washings and  aspirates are unacceptable for Xpert Xpress SARS-CoV-2/FLU/RSV  testing.  Fact Sheet for Patients: PinkCheek.be  Fact Sheet for Healthcare Providers: GravelBags.it  This test is not yet approved or cleared by the [place] FDA and  has been authorized for detection and/or diagnosis of SARS-CoV-2 by  FDA under an Emergency Use Authorization (EUA). This EUA will remain  in effect (meaning this test can be used) for the duration of the  Covid-19 declaration under Section 564(b)(1) of the Act, 21  U.S.C. section 360bbb-3(b)(1), unless the authorization is  terminated or revoked. Performed at [facility], [address] [place], [place] [address]   Culture, blood (Routine X 2) w Reflex to ID Panel     Status: None (Preliminary result)   Collection Time: 03/26/20 12:51 PM   Specimen: BLOOD  Result Value Ref Range Status   Specimen Description BLOOD RIGHT HAND  Final   Special Requests   Final    BOTTLES DRAWN AEROBIC AND ANAEROBIC Blood Culture results may not be optimal due to an inadequate volume of blood received in culture bottles   Culture   Final    NO GROWTH 3 DAYS Performed at [facility], [address] [place],  [address]    Report Status PENDING  Incomplete  Culture, blood (Routine X 2) w Reflex to ID Panel     Status: None (Preliminary result)   Collection Time: 03/26/20 12:51 PM   Specimen: BLOOD  Result Value Ref Range Status   Specimen Description BLOOD RIGHT ASSIST CONTROL  Final   Special Requests   Final    BOTTLES DRAWN AEROBIC AND ANAEROBIC Blood Culture results may not be optimal  due to an inadequate volume of blood received in culture bottles   Culture   Final    NO GROWTH 3 DAYS Performed at [facility], [address], [place],  [address]    Report Status PENDING  Incomplete         Radiology Studies: No results found.      Scheduled Meds: . azithromycin  250 mg Oral Daily  . budesonide (PULMICORT) nebulizer solution  0.5 mg Nebulization BID  . Chlorhexidine Gluconate Cloth  6 each Topical Daily  . enoxaparin (LOVENOX) injection  40 mg Subcutaneous Q24H  . ipratropium-albuterol  3 mL Nebulization TID  . nicotine  21 mg Transdermal Daily  . predniSONE  40 mg Oral Q breakfast   Continuous Infusions: . cefTRIAXone (ROCEPHIN)  IV 2 g (03/29/20 1138)     LOS: 3 days    Time spent: 20 min    Desma Maxim, MD [facility]   If 7PM-7AM, please contact night-coverage [email or web address] Password [email or web address] 03/29/2020, 2:25 PM

## 2020-03-29 NOTE — TOC Initial Note (Addendum)
Transition of Care (TOC) - Initial/Assessment Note    Patient Details  Name: Sharmarke Cicio. MRN: [id number] Date of Birth: 1960/12/06  Transition of Care [facility]) CM/SW Contact:    Magnus Ivan, LCSW Phone Number: 03/29/2020, 2:42 PM  Clinical Narrative:          SW called [facility] [phone]) about plan for discharge per Dr. Si Raider.   Randal Buba reported they could take patient back tomorrow. She asked if patient's new prescriptions can be sent to [facility] in [place], [place] so they can be sure to get the medicines in time for tomorrow. Dr. Si Raider sent prescriptions today.   Randal Buba reported she will need a new FL2 and would like to make sure all of patient's meds are listed on the FL2. Weekend TOC will need to fax [facility] and Discharge Summary to [facility] at [phone].  PT and OT recommend home health PT and OT. No agency preference per [place]. CSW reached to to [facility], [facility], and [facility] to see if they can accept him. None of these agencies can accept patient. Reached out to [facility], [facility] (waiting to hear back), and [facility] (unable to accept patient). Cheryl with [facility] accepted patient for HHPT and [place]. Asked Dr. Si Raider to put in [facility] orders.  Randal Buba reported patient does have a PCP, uses [facility] for primary care.  Randal Buba said one of her workers, Gerald Stabs, can transport patient back to group home tomorrow at discharge. She asked for staff to call her when patient is ready to discharge tomorrow.            Expected Discharge Plan: Group Home ([facility]) Barriers to Discharge: Continued Medical Work up   Patient Goals and CMS Choice Patient states their goals for this hospitalization and ongoing recovery are:: to return to group home CMS Medicare.gov Compare Post Acute Care list provided to:: Patient Represenative (must comment) Choice offered to / list presented to :  (Group Home  Director)  Expected Discharge Plan and Services Expected Discharge Plan: Group Home ([place])       Living arrangements for the past 2 months: Group Home                           HH Arranged: PT, OT   Date HH Agency Contacted: 03/29/20      Prior Living Arrangements/Services Living arrangements for the past 2 months: [place] Lives with:: Facility Resident Patient language and need for interpreter reviewed:: Yes        Need for Family Participation in Patient Care: No (Comment) Care giver support system in place?: Yes (comment)   Criminal Activity/Legal Involvement Pertinent to Current Situation/Hospitalization: No - Comment as needed  Activities of Daily Living      Permission Sought/Granted   Permission granted to share information with : Yes, Verbal Permission Granted     Permission granted to share info w AGENCY: Home Health        Emotional Assessment         Alcohol / Substance Use: Not Applicable Psych Involvement: No (comment)  Admission diagnosis:  CAP (community acquired pneumonia) [J18.9] Acute respiratory failure with hypoxia ([place]) [J96.01] Community acquired pneumonia, unspecified laterality [J18.9] Patient Active Problem List   Diagnosis Date Noted  . Acute respiratory failure with hypoxia ([place]) 03/26/2020  . Paranoid schizophrenia ([place])   . Bipolar 1 disorder ([place])   . Hypertension   .  CAP (community acquired pneumonia)   . Sepsis ([place])   . Schizophrenia, undifferentiated ([place]) 03/16/2018  . Noncompliance 03/16/2018  . Tobacco use disorder 03/16/2018   PCP:  Patient, No Pcp Per Pharmacy:   [facility], [place] [address] Phone: [phone] Fax: [phone]     Social Determinants of Health (SDOH) Interventions    Readmission Risk Interventions No flowsheet data found.

## 2020-03-29 NOTE — Progress Notes (Signed)
Pt ambulated around the nursing station on room air stating 90-91% on room air, once back in room and pt at rest O2 stats 92% on room air.

## 2020-03-29 NOTE — Progress Notes (Signed)
Resting quietly in room, he has had no issues today to make him upset or want to leave.  Called report to [facility] on 1A. Will transport there after he is done eating.

## 2020-03-30 DIAGNOSIS — J449 Chronic obstructive pulmonary disease, unspecified: Secondary | ICD-10-CM

## 2020-03-30 DIAGNOSIS — J441 Chronic obstructive pulmonary disease with (acute) exacerbation: Secondary | ICD-10-CM

## 2020-03-30 LAB — CBC
HCT: 45.2 % (ref 39.0–52.0)
Hemoglobin: 14.8 g/dL (ref 13.0–17.0)
MCH: 29.9 pg (ref 26.0–34.0)
MCHC: 32.7 g/dL (ref 30.0–36.0)
MCV: 91.3 fL (ref 80.0–100.0)
Platelets: 282 10*3/uL (ref 150–400)
RBC: 4.95 MIL/uL (ref 4.22–5.81)
RDW: 12.4 % (ref 11.5–15.5)
WBC: 10.9 10*3/uL — ABNORMAL HIGH (ref 4.0–10.5)
nRBC: 0 % (ref 0.0–0.2)

## 2020-03-30 LAB — COMPREHENSIVE METABOLIC PANEL WITH GFR
ALT: 27 U/L (ref 0–44)
AST: 14 U/L — ABNORMAL LOW (ref 15–41)
Albumin: 3.1 g/dL — ABNORMAL LOW (ref 3.5–5.0)
Alkaline Phosphatase: 73 U/L (ref 38–126)
Anion gap: 9 (ref 5–15)
BUN: 16 mg/dL (ref 6–20)
CO2: 31 mmol/L (ref 22–32)
Calcium: 9.1 mg/dL (ref 8.9–10.3)
Chloride: 99 mmol/L (ref 98–111)
Creatinine, Ser: 0.71 mg/dL (ref 0.61–1.24)
GFR, Estimated: >60 mL/min
Glucose, Bld: 101 mg/dL — ABNORMAL HIGH (ref 70–99)
Potassium: 3.8 mmol/L (ref 3.5–5.1)
Sodium: 139 mmol/L (ref 135–145)
Total Bilirubin: 0.5 mg/dL (ref 0.3–1.2)
Total Protein: 6.4 g/dL — ABNORMAL LOW (ref 6.5–8.1)

## 2020-03-30 MED ORDER — IPRATROPIUM-ALBUTEROL 0.5-2.5 (3) MG/3ML IN SOLN: 3.0000 mL | Freq: Two times a day (BID) | RESPIRATORY_TRACT | Status: DC

## 2020-03-30 MED ORDER — ALBUTEROL SULFATE HFA 108 (90 BASE) MCG/ACT IN AERS: 2.0000 | INHALATION_SPRAY | Freq: Four times a day (QID) | RESPIRATORY_TRACT | 2 refills | Status: DC | PRN

## 2020-03-30 NOTE — Discharge Summary (Addendum)
[place] KZL:[id number] DOB: Dec 18, 1960 DOA: 03/26/2020  PCP: Patient, No Pcp Per  Admit date: 03/26/2020 Discharge date: 03/30/2020  Time spent: 25 minutes  Recommendations for Outpatient Follow-up:  - F/[facility] appointmentmadewithDr. Pat[facility] - will need repeat CT of lungs in 3 months to f/u pulmonary nodule   Discharge Diagnoses:  Principal Problem:   CAP (community acquired pneumonia) Active Problems:   Tobacco use disorder   Paranoid schizophrenia ([place])   Bipolar 1 disorder ([place])   Hypertension   Sepsis ([place])   Acute respiratory failure with hypoxia ([place])   Discharge Condition: good  Diet recommendation: regular  Filed Weights   03/26/20 1002  Weight: 77.6 kg    History of present illness:  Kenneth Peckis a 59 y.o.malewith medical history significant ofhypertension, bipolar, paranoid schizophrenia, tobacco abuse, who presents with cough and shortness breath.  Phas been having shortness of breath formore than 3 days, which gradually startedandhas been progressively worsening.Patient has productive cough with yellow-colored sputum production.The patient states that he felt like he had a viral upper respiratory syndrome earlier in the weekend, and then developed the more severe shortness of breath in the last few days.Patient denies chest pain, fever or chills. Patient does not have nausea, vomiting, diarrhea, abdominal, symptoms of UTI or unilateral weakness. Patient is calm. No suicidal or homicidal ideations. He states he has been vaccinated for Covid.  Per report, patient oxygen desaturated to 60s on room air,patient was put on nonrebreather. Pt wasgiven Solu-Medrol and DuoNeb nebulizersby EMS. Oxygen saturation improved to91-94% on 3 L oxygen in ED.   ED Course:pt was found to have WBC 17.6, negative COVID-19 PCR, lactic acid 1.6, electrolytes renal function okay, temperature normal,  blood pressure 143/77, tachycardia with heart rate of 104, tachypnea with RR 34. I havepersonally reviewed the chest x-ray, which showed prominent interstitial marker in the left lungbasewith possible small left pleural effusion. Patient is admitted to [facility] bed as inpatient.  In late afternoon of day of admission, patient developed worsening shortness of breath.Auscultation showed diffuse crackles, mild wheezing and decreased air movement.This happened after thepatient received 2.5 L normal saline. ABG with pH of 7.16, PCO2 100, PO2 72. Started on bipap, given lasix 20, critical care consulted.  Hospital Course:  Sepsis and acute respiratory failure with hypoxiadue to possible CAP vs COPD exacerbation:Patient presented with productive cough, shortness of breath, chest x-ray showed prominent interstitial marking,though no obvious consolidation.Patient is smoker, currently smokes 3 cigarettes/day. He has wheezing on auscultation, indicating possible undiagnosed COPD with exacerbation, and given normal trend of procalcitonin do now favor copd exacerbation. Patient met criteria for sepsis with leukocytosis, tachycardia and tachypnea. Worsening respiratory statuson 10/12, hypoxic and acidemic on abg, placed on bipap in the afternoon and given lasix 20, did not tolerate and self d/c'd in evening of 10/12. 10/13 wheezing and tachypnic, slowly improving throughout the day and night. Much improved at time of discharge, off O2. - received ceftriaxone/azithromycin for 5 days as well as 5 days of steroids. - F/[facility]made[facility]Dr. [facility] - PT/OT no barriers to discharge - start daily spiriva, also ordering prn albuterol inhaler as doesn't appear had one   Schizophrenia  On paliperidone IM q 28 days. Pt says last dose was on or around 03/20/20  Pulmonary nodule - will need outpt repeat CT in 3 mo  Procedures:  none    Consultations:  Critical care  Discharge Exam: Vitals:  03/30/20 0001 03/30/20 0822  BP: 125/86 (!) 140/94  Pulse: 77 85  Resp: 20 17  Temp: 98.1 F (36.7 C) 97.9 F (36.6 C)  SpO2: (!) 88% 96%    General exam: Appears calm and comfortable  Respiratory system: few scattered rhonchi Cardiovascular system: S1 & S2 heard, soft systolic murmur Gastrointestinal system: Abdomen is nondistended, soft and nontender. No organomegaly or masses felt. Normal bowel sounds heard. Central nervous system: Alert and oriented. No focal neurological deficits. Extremities: Symmetric 5 x 5 power. Skin: No rashes, lesions or ulcers Psychiatry: alert and oriented  Discharge Instructions   Discharge Instructions    Call MD for:  difficulty breathing, headache or visual disturbances   Complete by: As directed    Call MD for:  extreme fatigue   Complete by: As directed    Call MD for:  hives   Complete by: As directed    Call MD for:  persistant dizziness or light-headedness   Complete by: As directed    Call MD for:  persistant nausea and vomiting   Complete by: As directed    Call MD for:  redness, tenderness, or signs of infection (pain, swelling, redness, odor or green/yellow discharge around incision site)   Complete by: As directed    Call MD for:  severe uncontrolled pain   Complete by: As directed    Call MD for:  temperature >100.4   Complete by: As directed    Diet general   Complete by: As directed    Increase activity slowly   Complete by: As directed      Allergies as of 03/30/2020   No Known Allergies     Medication List    TAKE these medications   albuterol 108 (90 Base) MCG/ACT inhaler Commonly known as: VENTOLIN HFA Inhale 2 puffs into the lungs every 6 (six) hours as needed for wheezing or shortness of breath.   paliperidone 234 MG/1.5ML Susy injection Commonly known as: INVEGA SUSTENNA Inject 234 mg into the muscle every 28 (twenty-eight) days.    Spiriva HandiHaler 18 MCG inhalation capsule Generic drug: tiotropium Place 1 capsule (18 mcg total) into inhaler and inhale daily.      No Known Allergies  Follow-up Information    Tyler Pita, MD Follow up.   Specialty: Pulmonary Disease Why: You have an appointment scheduled on November 1st, 2021, at 1:30 PM. Please call to confirm the appointment. Contact information: [place] [address] [phone]                The results of significant diagnostics from this hospitalization (including imaging, microbiology, ancillary and laboratory) are listed below for reference.    Significant Diagnostic Studies: CT ANGIO CHEST PE W OR WO CONTRAST  Result Date: 03/26/2020 CLINICAL DATA:  SHOB/productive cough since Sunday. EXAM: CT ANGIOGRAPHY CHEST WITH CONTRAST TECHNIQUE: Multidetector CT imaging of the chest was performed using the standard protocol during bolus administration of intravenous contrast. Multiplanar CT image reconstructions and MIPs were obtained to evaluate the vascular anatomy. CONTRAST:  116m OMNIPAQUE IOHEXOL 350 MG/ML SOLN COMPARISON:  Chest x-ray 03/26/2020. chest x-ray 06/03/2011 FINDINGS: Cardiovascular: Satisfactory opacification of the pulmonary arteries to the segmental level. No evidence of pulmonary embolism. Normal heart size. No pericardial effusion. Mediastinum/Nodes: There is a an enlarged right hilar lymph node measuring up to 1.9 cm in short axis. No enlarged mediastinal or axillary lymph nodes. The thyroid and esophagus demonstrate no significant findings. Debris within  the trachea. Lungs/Pleura: Diffuse, with lower lobe predominance, small centrilobular nodules and tree in bud appearance. Suggestion of cavitation within the right upper and lower lobe (6: 54, 51-52). Similar possible cavitary findings within the left upper lobe (6:21). There is a paramediastinal 1 x 1.4 cm solid pulmonary nodule (4:60). Left base  scarring. Upper Abdomen: No acute abnormality. Musculoskeletal: No chest wall abnormality No suspicious lytic or blastic osseous lesions. No acute displaced fracture. Old healed left rib fractures. Multilevel degenerative changes of the spine. Review of the MIP images confirms the above findings. IMPRESSION: 1. Bronchiolitis. Indeterminate etiology, possibly infectious or inflammatory. Consider trial therapy with follow-up HRCT as outpatient for follow-up. 2. A 1 x 1.4 cm solid pulmonary nodule. Recommend repeat CT in 3 months to evaluate stability. 3. Indeterminate right hilar lymphadenopathy that is likely reactive. Attention on follow-up. 4. No pulmonary embolus. Electronically Signed   By: Iven Finn M.D.   On: 03/26/2020 21:27   DG Chest Port 1 View  Result Date: 03/27/2020 CLINICAL DATA:  Dyspnea.  Shortness of breath and cough. EXAM: PORTABLE CHEST 1 VIEW COMPARISON:  03/26/2020 FINDINGS: Normal heart size. Pleuroparenchymal scarring with blunting of the left costophrenic angle is again noted. Diffuse reticulonodular opacities are noted throughout both lungs. No lobar consolidation or atelectasis. No acute bone abnormality noted. Remote left posterior rib fractures. IMPRESSION: 1. Persistent diffuse bilateral reticular and nodular opacities compatible with inflammatory/infectious bronchiolitis. 2. Chronic left pleural thickening and scarring. Electronically Signed   By: Kerby Moors M.D.   On: 03/27/2020 10:20   DG Chest Portable 1 View  Result Date: 03/26/2020 CLINICAL DATA:  Shortness of breath, productive cough since _0 0/88 mmHg Patient Gender: M                   HR:  90 bpm. Exam Location:  [facility] Procedure: 2D Echo, Cardiac Doppler and Color Doppler Indications:     I50.21 Acute Systolic CHF  History:         Patient has no prior history of Echocardiogram examinations.                  Risk Factors:Hypertension.  Sonographer:     Sedonia Small Rodgers-Jones Referring Phys:  Wynona Neat NIU Diagnosing Phys: Yvonne Kendall MD IMPRESSIONS  1. Left ventricular ejection fraction, by estimation, is 60 to 65%. The left ventricle has normal function. The left ventricle has no regional wall motion abnormalities. Left ventricular diastolic parameters were normal.  2. Right ventricular systolic function is normal. The right ventricular size is mildly enlarged. There is mildly elevated pulmonary artery systolic pressure.  3. The mitral valve is normal in structure. Trivial mitral valve regurgitation.  No evidence of mitral stenosis.  4. The aortic valve has an indeterminant number of cusps. Aortic valve regurgitation is not visualized. No aortic stenosis is present.  5. The inferior vena cava is dilated in size with >50% respiratory variability, suggesting right atrial pressure of 8 mmHg. FINDINGS  Left Ventricle: Left ventricular ejection fraction, by estimation, is 60 to 65%. The left ventricle has normal function. The left ventricle has no regional wall motion abnormalities. The left ventricular internal cavity size was normal in size. There is  no left ventricular hypertrophy. Left ventricular diastolic parameters were normal. Right Ventricle: The right ventricular size is mildly enlarged. No increase in right ventricular wall thickness. Right ventricular systolic function is normal. There is mildly elevated pulmonary artery systolic pressure. The tricuspid regurgitant velocity is 2.99 m/s, and with an assumed right atrial pressure of 8 mmHg, the estimated right ventricular systolic pressure is 43.8 mmHg. Left Atrium: Left atrial size was normal in size. Right Atrium: Right atrial size was normal in size. Pericardium: There is no evidence of pericardial effusion. Mitral Valve: The mitral valve is normal in structure. Trivial mitral valve regurgitation. No evidence of mitral valve stenosis. Tricuspid Valve: The tricuspid valve is normal in structure. Tricuspid valve regurgitation is trivial. Aortic Valve: The aortic valve has an indeterminant number of cusps. Aortic valve regurgitation is not visualized. No aortic stenosis is present. Pulmonic Valve: The pulmonic valve was not well visualized. Pulmonic valve regurgitation is not visualized. No evidence of pulmonic stenosis. Aorta: The aortic root is normal in size and structure. Pulmonary Artery: The pulmonary artery is not well seen. Venous: The inferior vena cava is dilated in size with greater than 50% respiratory variability, suggesting right atrial  pressure of 8 mmHg. IAS/Shunts: No atrial level shunt detected by color flow Doppler.  LEFT VENTRICLE PLAX 2D LVIDd:         4.58 cm  Diastology LVIDs:         2.78 cm  LV e' medial:    11.70 cm/s LV PW:         0.82 cm  LV E/e' medial:  10.7 LV IVS:        0.60 cm  LV e' lateral:   12.60 cm/s LVOT diam:     2.30 cm  LV E/e' lateral: 9.9 LV SV:         116 LV SV Index:   58 LVOT Area:     4.15 cm  RIGHT VENTRICLE             IVC RV Basal diam:  4.80 cm     IVC diam:  1.91 cm RV S prime:     19.20 cm/s TAPSE (M-mode): 2.9 cm LEFT ATRIUM           Index       RIGHT ATRIUM           Index LA diam:      3.40 cm 1.71 cm/m  RA Area:     16.40 cm LA Vol (A2C): 54.5 ml 27.34 ml/m RA Volume:   49.70 ml  24.93 ml/m LA Vol (A4C): 37.1 ml 18.61 ml/m  AORTIC VALVE LVOT Vmax:   152.00 cm/s LVOT Vmean:  105.000 cm/s LVOT VTI:    0.278 m  AORTA Ao Root diam: 3.40 cm MITRAL VALVE                TRICUSPID VALVE MV Area (PHT): 4.06 cm     TR Peak grad:   35.8 mmHg MV Decel Time: 187 msec     TR Vmax:        299.00 cm/s MV E velocity: 125.00 cm/s MV A velocity: 103.00 cm/s  SHUNTS MV E/A ratio:  1.21         Systemic VTI:  0.28 m                             Systemic Diam: 2.30 cm Nelva Bush MD Electronically signed by Nelva Bush MD Signature Date/Time: 03/27/2020/9:01:21 AM    Final     Microbiology: Recent Results (from the past 240 hour(s))  Respiratory Panel by RT PCR (Flu A&B, Covid) - Nasopharyngeal Swab     Status: None   Collection Time: 03/26/20 10:06 AM   Specimen: Nasopharyngeal Swab  Result Value Ref Range Status   SARS Coronavirus 2 by RT PCR NEGATIVE NEGATIVE Final    Comment: (NOTE) SARS-CoV-2 target nucleic acids are NOT DETECTED.  The SARS-CoV-2 RNA is generally detectable in upper respiratoy specimens during the acute phase of infection. The lowest concentration of SARS-CoV-2 viral copies this assay can detect is 131 copies/mL. A negative result does not preclude SARS-Cov-2 infection  and should not be used as the sole basis for treatment or other patient management decisions. A negative result may occur with  improper specimen collection/handling, submission of specimen other than nasopharyngeal swab, presence of viral mutation(s) within the areas targeted by this assay, and inadequate number of viral copies (<131 copies/mL). A negative result must be combined with clinical observations, patient history, and epidemiological information. The expected result is Negative.  Fact Sheet for Patients:  PinkCheek.be  Fact Sheet for Healthcare Providers:  GravelBags.it  This test is no t yet approved or cleared by the [place] FDA and  has been authorized for detection and/or diagnosis of SARS-CoV-2 by FDA under an Emergency Use Authorization (EUA). This EUA will remain  in effect (meaning this test can be used) for the duration of the COVID-19 declaration under Section 564(b)(1) of the Act, 21 U.S.C. section 360bbb-3(b)(1), unless the authorization is terminated or revoked sooner.     Influenza A by PCR NEGATIVE NEGATIVE Final   Influenza B by PCR NEGATIVE NEGATIVE Final    Comment: (NOTE) The Xpert Xpress SARS-CoV-2/FLU/RSV assay is intended as an aid in  the diagnosis of influenza from Nasopharyngeal swab specimens and  should not be used as a sole basis for treatment. Nasal washings and  aspirates are unacceptable for Xpert Xpress SARS-CoV-2/FLU/RSV  testing.  Fact Sheet for Patients: PinkCheek.be  Fact Sheet  for Healthcare Providers: GravelBags.it  This test is not yet approved or cleared by the [place] and  has been authorized for detection and/or diagnosis of SARS-CoV-2 by  FDA under an Emergency Use Authorization (EUA). This EUA will remain  in effect (meaning this test can be used) for the duration of the  Covid-19 declaration  under Section 564(b)(1) of the Act, 21  U.S.C. section 360bbb-3(b)(1), unless the authorization is  terminated or revoked. Performed at [facility], [address] [place], [place] [address]   Culture, blood (Routine X 2) w Reflex to ID Panel     Status: None (Preliminary result)   Collection Time: 03/26/20 12:51 PM   Specimen: BLOOD  Result Value Ref Range Status   Specimen Description BLOOD RIGHT HAND  Final   Special Requests   Final    BOTTLES DRAWN AEROBIC AND ANAEROBIC Blood Culture results may not be optimal due to an inadequate volume of blood received in culture bottles   Culture   Final    NO GROWTH 4 DAYS Performed at [facility], [address] [place], [place] [address]    Report Status PENDING  Incomplete  Culture, blood (Routine X 2) w Reflex to ID Panel     Status: None (Preliminary result)   Collection Time: 03/26/20 12:51 PM   Specimen: BLOOD  Result Value Ref Range Status   Specimen Description BLOOD RIGHT ASSIST CONTROL  Final   Special Requests   Final    BOTTLES DRAWN AEROBIC AND ANAEROBIC Blood Culture results may not be optimal due to an inadequate volume of blood received in culture bottles   Culture   Final    NO GROWTH 4 DAYS Performed at [facility], [address] [place], [place] [address]    Report Status PENDING  Incomplete     Labs: Basic Metabolic Panel: Recent Labs  Lab 03/26/20 1006 03/27/20 0359 03/28/20 0428 03/29/20 0623 03/30/20 0334  NA 136 138 142 139 139  K 4.0 4.6 4.2 4.1 3.8  CL 92* 96* 97* 96* 99  CO2 33* 33* 38* 36* 31  GLUCOSE 134* 136* 117* 106* 101*  BUN _0 CREATININE 0.80 0.76 0.57* 0.81 0.71  CALCIUM 9.3 8.7* 8.8* 9.0 9.1   Liver Function Tests: Recent Labs  Lab 03/26/20 1006 03/28/20 0428 03/29/20 0623 03/30/20 0334  AST _1 14*  ALT _2 ALKPHOS 80 76 90 73  BILITOT 0.5 0.3 0.5 0.5  PROT 7.6 6.0* 6.8 6.4*  ALBUMIN 3.8 2.9* 3.4* 3.1*   No  results for input(s): LIPASE, AMYLASE in the last 168 hours. No results for input(s): AMMONIA in the last 168 hours. CBC: Recent Labs  Lab 03/26/20 1006 03/27/20 0359 03/28/20 0428 03/29/20 0623 03/30/20 0334  WBC 17.6* 17.2* 14.0* 12.7* 10.9*  NEUTROABS 14.6*  --   --   --   --   HGB 15.2 13.8 13.2 15.1 14.8  HCT 46.3 43.4 41.5 47.1 45.2  MCV 92.2 94.6 94.5 95.0 91.3  PLT 234 224 239 263 282   Cardiac Enzymes: No results for input(s): CKTOTAL, CKMB, CKMBINDEX, TROPONINI in the last 168 hours. BNP: BNP (last 3 results) Recent Labs    03/26/20 1006 03/27/20 0359  BNP 174.4* 176.5*    ProBNP (last 3 results) No results for input(s): PROBNP in the last 8760 hours.  CBG: Recent Labs  Lab 03/26/20 2111 03/27/20 1552  GLUCAP 137*  104*       Signed:  Desma Maxim MD.  [facility] 03/30/2020, 9:53 AM

## 2020-03-30 NOTE — Plan of Care (Signed)

## 2020-03-30 NOTE — TOC Transition Note (Signed)
Transition of Care [facility]) - CM/SW Discharge Note   Patient Details  Name: Kenneth [name]. MRN: [id number] Date of Birth: 23-Feb-1961  [facility] CM/SW Contact:  Boris Sharper, LCSW Phone Number: 03/30/2020, 11:43 AM   Clinical Narrative:    Pt is medically stable for discharge per MD. Pt will be transported by [place] back to the group home facility. CSW faxed FL2 and Discharge Summary to [facility] at [place].     Final next level of care: Group Home Barriers to Discharge: No Barriers Identified   Patient Goals and CMS Choice Patient states their goals for this hospitalization and ongoing recovery are:: to return to group home CMS Medicare.gov Compare Post Acute Care list provided to:: Patient Represenative (must comment) Choice offered to / list presented to :  (Group Home Director)  Discharge Placement                Patient to be transferred to facility by: Group home Name of family member notified: [place] Patient and family notified of of transfer: 03/30/20  Discharge Plan and Services                          HH Arranged: PT, OT [facility] Agency: [place] Date [place]: 03/30/20 Time [place]: 1140 Representative spoke with at [place]: [place] ([place]) Interventions     Readmission Risk Interventions No flowsheet data found.

## 2020-03-31 LAB — CULTURE, BLOOD (ROUTINE X 2)
Culture: NO GROWTH
Culture: NO GROWTH

## 2020-04-09 DIAGNOSIS — F17210 Nicotine dependence, cigarettes, uncomplicated: Secondary | ICD-10-CM | POA: Diagnosis not present

## 2020-04-09 DIAGNOSIS — I10 Essential (primary) hypertension: Secondary | ICD-10-CM | POA: Diagnosis not present

## 2020-04-15 ENCOUNTER — Institutional Professional Consult (permissible substitution): Payer: Medicare Other | Admitting: Pulmonary Disease

## 2020-05-07 DIAGNOSIS — F17210 Nicotine dependence, cigarettes, uncomplicated: Secondary | ICD-10-CM | POA: Diagnosis not present

## 2020-05-07 DIAGNOSIS — I10 Essential (primary) hypertension: Secondary | ICD-10-CM | POA: Diagnosis not present

## 2020-06-04 ENCOUNTER — Ambulatory Visit: Payer: Medicare Other | Attending: Internal Medicine

## 2020-06-04 DIAGNOSIS — Z23 Encounter for immunization: Secondary | ICD-10-CM

## 2020-06-04 NOTE — Progress Notes (Signed)
   [id number]  Name:  Kenneth [name].    MRN: [id number] DOB: 1960/12/04  06/04/2020  Mr. Kenneth [name] was observed post Covid-19 immunization for 15 min  without incident. He was provided with Vaccine Information Sheet and instruction to access the V-Safe system.   Mr. Kenneth [name] was instructed to call 911 with any severe reactions post vaccine: Marland Kitchen Difficulty breathing  . Swelling of face and throat  . A fast heartbeat  . A bad rash all over body  . Dizziness and weakness   Immunizations Administered    Name Date Dose VIS Date Route   Moderna Covid-19 Booster Vaccine 06/04/2020 12:03 PM 0.25 mL 04/03/2020 Intramuscular   Manufacturer: Levan Hurst   Lot: [id number]   [place]: [phone]

## 2020-06-24 DIAGNOSIS — R059 Cough, unspecified: Secondary | ICD-10-CM | POA: Diagnosis not present

## 2020-06-24 DIAGNOSIS — J310 Chronic rhinitis: Secondary | ICD-10-CM | POA: Diagnosis not present

## 2020-07-29 DIAGNOSIS — F17210 Nicotine dependence, cigarettes, uncomplicated: Secondary | ICD-10-CM | POA: Diagnosis not present

## 2020-07-29 DIAGNOSIS — I10 Essential (primary) hypertension: Secondary | ICD-10-CM | POA: Diagnosis not present

## 2020-07-29 DIAGNOSIS — Z79899 Other long term (current) drug therapy: Secondary | ICD-10-CM | POA: Diagnosis not present

## 2020-07-29 DIAGNOSIS — R000 Tachycardia, unspecified: Secondary | ICD-10-CM | POA: Diagnosis not present

## 2020-08-27 DIAGNOSIS — J310 Chronic rhinitis: Secondary | ICD-10-CM | POA: Diagnosis not present

## 2020-08-27 DIAGNOSIS — R519 Headache, unspecified: Secondary | ICD-10-CM | POA: Diagnosis not present

## 2020-08-27 DIAGNOSIS — J018 Other acute sinusitis: Secondary | ICD-10-CM | POA: Diagnosis not present

## 2020-09-26 ENCOUNTER — Other Ambulatory Visit: Payer: Self-pay | Admitting: Obstetrics and Gynecology

## 2020-09-27 DIAGNOSIS — R519 Headache, unspecified: Secondary | ICD-10-CM | POA: Diagnosis not present

## 2020-09-27 DIAGNOSIS — J310 Chronic rhinitis: Secondary | ICD-10-CM | POA: Diagnosis not present

## 2020-10-08 ENCOUNTER — Other Ambulatory Visit: Payer: Self-pay | Admitting: Obstetrics and Gynecology

## 2020-10-28 DIAGNOSIS — M791 Myalgia, unspecified site: Secondary | ICD-10-CM | POA: Diagnosis not present

## 2020-11-13 DIAGNOSIS — J45909 Unspecified asthma, uncomplicated: Secondary | ICD-10-CM | POA: Diagnosis not present

## 2020-11-13 DIAGNOSIS — G4701 Insomnia due to medical condition: Secondary | ICD-10-CM | POA: Diagnosis not present

## 2020-11-21 DIAGNOSIS — R5383 Other fatigue: Secondary | ICD-10-CM | POA: Diagnosis not present

## 2020-11-21 DIAGNOSIS — I10 Essential (primary) hypertension: Secondary | ICD-10-CM | POA: Diagnosis not present

## 2020-11-21 DIAGNOSIS — E7800 Pure hypercholesterolemia, unspecified: Secondary | ICD-10-CM | POA: Diagnosis not present

## 2020-11-21 DIAGNOSIS — F17210 Nicotine dependence, cigarettes, uncomplicated: Secondary | ICD-10-CM | POA: Diagnosis not present

## 2020-11-22 DIAGNOSIS — E7800 Pure hypercholesterolemia, unspecified: Secondary | ICD-10-CM | POA: Diagnosis not present

## 2020-11-22 DIAGNOSIS — E119 Type 2 diabetes mellitus without complications: Secondary | ICD-10-CM | POA: Diagnosis not present

## 2020-11-22 DIAGNOSIS — E559 Vitamin D deficiency, unspecified: Secondary | ICD-10-CM | POA: Diagnosis not present

## 2020-11-22 DIAGNOSIS — R5383 Other fatigue: Secondary | ICD-10-CM | POA: Diagnosis not present

## 2020-11-22 DIAGNOSIS — E039 Hypothyroidism, unspecified: Secondary | ICD-10-CM | POA: Diagnosis not present

## 2020-12-31 DIAGNOSIS — F17210 Nicotine dependence, cigarettes, uncomplicated: Secondary | ICD-10-CM | POA: Diagnosis not present

## 2020-12-31 DIAGNOSIS — E7800 Pure hypercholesterolemia, unspecified: Secondary | ICD-10-CM | POA: Diagnosis not present

## 2020-12-31 DIAGNOSIS — Z0000 Encounter for general adult medical examination without abnormal findings: Secondary | ICD-10-CM | POA: Diagnosis not present

## 2020-12-31 DIAGNOSIS — E559 Vitamin D deficiency, unspecified: Secondary | ICD-10-CM | POA: Diagnosis not present

## 2020-12-31 DIAGNOSIS — I10 Essential (primary) hypertension: Secondary | ICD-10-CM | POA: Diagnosis not present

## 2020-12-31 DIAGNOSIS — R5383 Other fatigue: Secondary | ICD-10-CM | POA: Diagnosis not present

## 2020-12-31 DIAGNOSIS — Z1389 Encounter for screening for other disorder: Secondary | ICD-10-CM | POA: Diagnosis not present

## 2021-01-29 DIAGNOSIS — G603 Idiopathic progressive neuropathy: Secondary | ICD-10-CM | POA: Diagnosis not present

## 2021-01-29 DIAGNOSIS — M79661 Pain in right lower leg: Secondary | ICD-10-CM | POA: Diagnosis not present

## 2022-04-21 ENCOUNTER — Inpatient Hospital Stay: Payer: Medicare HMO

## 2022-04-21 ENCOUNTER — Inpatient Hospital Stay: Payer: Medicare HMO | Attending: Oncology | Admitting: Oncology

## 2022-04-21 ENCOUNTER — Encounter: Payer: Self-pay | Admitting: Oncology

## 2022-04-21 VITALS — BP 90/63 | HR 83 | Temp 99.4°F | Resp 16 | Ht 72.0 in | Wt 167.0 lb

## 2022-04-21 DIAGNOSIS — D509 Iron deficiency anemia, unspecified: Secondary | ICD-10-CM | POA: Insufficient documentation

## 2022-04-21 LAB — CBC
HCT: 39.1 % (ref 39.0–52.0)
Hemoglobin: 12.3 g/dL — ABNORMAL LOW (ref 13.0–17.0)
MCH: 27.3 pg (ref 26.0–34.0)
MCHC: 31.5 g/dL (ref 30.0–36.0)
MCV: 86.7 fL (ref 80.0–100.0)
Platelets: 362 10*3/uL (ref 150–400)
RBC: 4.51 MIL/uL (ref 4.22–5.81)
RDW: 14.3 % (ref 11.5–15.5)
WBC: 12.2 10*3/uL — ABNORMAL HIGH (ref 4.0–10.5)
nRBC: 0 % (ref 0.0–0.2)

## 2022-04-21 LAB — IRON AND TIBC
Iron: 22 ug/dL — ABNORMAL LOW (ref 45–182)
Saturation Ratios: 10 % — ABNORMAL LOW (ref 17.9–39.5)
TIBC: 232 ug/dL — ABNORMAL LOW (ref 250–450)
UIBC: 210 ug/dL

## 2022-04-21 LAB — VITAMIN B12: Vitamin B-12: 300 pg/mL (ref 180–914)

## 2022-04-21 LAB — FERRITIN: Ferritin: 199 ng/mL (ref 24–336)

## 2022-04-21 LAB — FOLATE: Folate: 23 ng/mL (ref >5.9)

## 2022-04-21 LAB — DAT, POLYSPECIFIC AHG (ARMC ONLY): Polyspecific AHG test: NEGATIVE

## 2022-04-21 LAB — LACTATE DEHYDROGENASE: LDH: 112 U/L (ref 98–192)

## 2022-04-22 ENCOUNTER — Encounter: Payer: Self-pay | Admitting: Oncology

## 2022-04-22 LAB — HAPTOGLOBIN: Haptoglobin: 361 mg/dL (ref 29–370)

## 2022-04-22 MED FILL — Iron Sucrose Inj 20 MG/ML (Fe Equiv): INTRAVENOUS | Qty: 10 | Status: AC

## 2022-04-22 NOTE — Progress Notes (Signed)
[name]  [phone] [phone]  ID: Kenneth [name]. OB: 08-29-1960  MR#: [id number]  XBM#:[id number]  Patient Care Team: Patient, No Pcp Per as PCP - General (General Practice)  CHIEF COMPLAINT: Iron deficiency anemia.  INTERVAL HISTORY: Patient is a 61 year old male who was noted to have a decreased hemoglobin and iron stores on routine blood work.  Much of his history is given by his caretaker.  He currently feels well and is asymptomatic.  He has no neurologic complaints.  He denies any recent fevers or illnesses.  He has a good appetite and denies weight loss.  He has no chest pain, shortness of breath, cough, or hemoptysis.  He denies any nausea, vomiting, constipation, or diarrhea.  He has no melena or hematochezia.  He has no urinary complaints.  Patient is at his baseline and offers no specific complaints today.  REVIEW OF SYSTEMS:   Review of Systems  Constitutional: Negative.  Negative for fever, malaise/fatigue and weight loss.  Respiratory: Negative.  Negative for cough, hemoptysis and shortness of breath.   Cardiovascular: Negative.  Negative for chest pain and leg swelling.  Gastrointestinal: Negative.  Negative for abdominal pain, blood in stool and melena.  Genitourinary: Negative.  Negative for hematuria.  Musculoskeletal: Negative.  Negative for back pain.  Skin: Negative.  Negative for rash.  Neurological: Negative.  Negative for dizziness, focal weakness, weakness and headaches.  Psychiatric/Behavioral: Negative.  The patient is not nervous/anxious.     As per HPI. Otherwise, a complete review of systems is negative.  PAST MEDICAL HISTORY: Past Medical History:  Diagnosis Date   Bipolar 1 disorder ([place])    Hypertension    Memory loss    Paranoid schizophrenia ([place])     PAST SURGICAL HISTORY: No past surgical history on file.  FAMILY HISTORY: Family History  Problem Relation Age of Onset   Diabetes Mother     Cancer Mother    Cancer Maternal Grandfather     ADVANCED DIRECTIVES (Y/N):  N  HEALTH MAINTENANCE: Social History   Tobacco Use   Smoking status: Every Day    Types: Cigarettes   Smokeless tobacco: Never  Substance Use Topics   Alcohol use: Not Currently   Drug use: Never     Colonoscopy:  PAP:  Bone density:  Lipid panel:  No Known Allergies  Current Outpatient Medications  Medication Sig Dispense Refill   albuterol (VENTOLIN HFA) 108 (90 Base) MCG/ACT inhaler Inhale 2 puffs into the lungs every 6 (six) hours as needed for wheezing or shortness of breath. 8 g 2   atorvastatin (LIPITOR) 20 MG tablet Take 20 mg by mouth daily.     BREZTRI AEROSPHERE 160-9-4.8 MCG/ACT AERO Inhale into the lungs.     hydrOXYzine (ATARAX) 25 MG tablet Take 25 mg by mouth 3 (three) times daily.     INVEGA TRINZA 819 MG/2.63ML injection Inject into the muscle.     lisinopril (ZESTRIL) 5 MG tablet Take 5 mg by mouth daily.     montelukast (SINGULAIR) 10 MG tablet Take 10 mg by mouth daily.     paliperidone (INVEGA SUSTENNA) 234 MG/1.5ML SUSY injection Inject 234 mg into the muscle every 28 (twenty-eight) days. 1.8 mL 1   PARoxetine (PAXIL) 20 MG tablet Take 20 mg by mouth daily.     No current facility-administered medications for this visit.    OBJECTIVE: Vitals:   04/21/22 1109  BP: 90/63  Pulse: 83  Resp: 16  Temp:  99.4 F (37.4 C)  SpO2: 96%     Body mass index is 22.65 kg/m.    ECOG FS:0 - Asymptomatic  General: Well-developed, well-nourished, no acute distress. Eyes: Pink conjunctiva, anicteric sclera. HEENT: Normocephalic, moist mucous membranes. Lungs: No audible wheezing or coughing. Heart: Regular rate and rhythm. Abdomen: Soft, nontender, no obvious distention. Musculoskeletal: No edema, cyanosis, or clubbing. Neuro: Alert, answering all questions appropriately. Cranial nerves grossly intact. Skin: No rashes or petechiae noted. Psych: Normal affect. Lymphatics: No  cervical, calvicular, axillary or inguinal LAD.   LAB RESULTS:  Lab Results  Component Value Date   NA 139 03/30/2020   K 3.8 03/30/2020   CL 99 03/30/2020   CO2 31 03/30/2020   GLUCOSE 101 (H) 03/30/2020   BUN 16 03/30/2020   CREATININE 0.71 03/30/2020   CALCIUM 9.1 03/30/2020   PROT 6.4 (L) 03/30/2020   ALBUMIN 3.1 (L) 03/30/2020   AST 14 (L) 03/30/2020   ALT 27 03/30/2020   ALKPHOS 73 03/30/2020   BILITOT 0.5 03/30/2020   GFRNONAA >60 03/30/2020   GFRAA >60 11/21/2018    Lab Results  Component Value Date   WBC 12.2 (H) 04/21/2022   NEUTROABS 14.6 (H) 03/26/2020   HGB 12.3 (L) 04/21/2022   HCT 39.1 04/21/2022   MCV 86.7 04/21/2022   PLT 362 04/21/2022   Lab Results  Component Value Date   IRON 22 (L) 04/21/2022   TIBC 232 (L) 04/21/2022   IRONPCTSAT 10 (L) 04/21/2022   Lab Results  Component Value Date   FERRITIN 199 04/21/2022     STUDIES: No results found.  ASSESSMENT: Iron deficiency anemia.  PLAN:    Iron deficiency anemia: Patient's hemoglobin is mildly reduced at 12.3, but he has a significantly reduced total iron and iron saturation.  B12 and folate are within normal limits.  Recommend patient have a colonoscopy.  Return to clinic 5 times over the next 3 weeks to receive 200 mg IV Venofer.  Patient then return to clinic in 4 months with repeat laboratory work, further evaluation, and consideration of additional IV iron if needed.  I spent a total of 45 minutes reviewing chart data, face-to-face evaluation with the patient, counseling and coordination of care as detailed above.   Patient expressed understanding and was in agreement with this plan. He also understands that He can call clinic at any time with any questions, concerns, or complaints.    Lloyd Huger, MD   04/22/2022 5:41 PM

## 2022-04-23 ENCOUNTER — Inpatient Hospital Stay: Payer: Medicare HMO

## 2022-04-27 MED FILL — Iron Sucrose Inj 20 MG/ML (Fe Equiv): INTRAVENOUS | Qty: 10 | Status: AC

## 2022-04-28 ENCOUNTER — Inpatient Hospital Stay: Payer: Medicare HMO

## 2022-04-30 ENCOUNTER — Inpatient Hospital Stay: Payer: Medicare HMO

## 2022-05-04 MED FILL — Iron Sucrose Inj 20 MG/ML (Fe Equiv): INTRAVENOUS | Qty: 10 | Status: AC

## 2022-05-05 ENCOUNTER — Inpatient Hospital Stay: Payer: Medicare HMO

## 2022-05-11 MED FILL — Iron Sucrose Inj 20 MG/ML (Fe Equiv): INTRAVENOUS | Qty: 10 | Status: AC

## 2022-05-12 ENCOUNTER — Inpatient Hospital Stay: Payer: Medicare HMO

## 2022-05-26 ENCOUNTER — Inpatient Hospital Stay: Payer: Medicare HMO | Attending: Oncology

## 2022-05-26 VITALS — BP 104/75 | HR 100 | Temp 97.0°F | Resp 16

## 2022-05-26 DIAGNOSIS — D509 Iron deficiency anemia, unspecified: Secondary | ICD-10-CM | POA: Insufficient documentation

## 2022-05-26 MED ORDER — SODIUM CHLORIDE 0.9 % IV SOLN
Freq: Once | INTRAVENOUS | Status: AC
  Filled 2022-05-26: qty 250

## 2022-05-26 MED ORDER — SODIUM CHLORIDE 0.9 % IV SOLN
200.0000 mg | Freq: Once | INTRAVENOUS | Status: AC
  Administered 2022-05-26: 200 mg via INTRAVENOUS
  Filled 2022-05-26: qty 200

## 2022-05-26 NOTE — Patient Instructions (Signed)

## 2022-05-27 IMAGING — DX DG CHEST 1V PORT
1 series · 2 of 2 positions shown · non-contrast
Comparison: Examination from [DATE]

CLINICAL DATA: Shortness of breath, productive cough since [REDACTED]

EXAM:
PORTABLE CHEST 1 VIEW

[Series 1: chest ap · 0.14mm/px · 2 of 2 slices shown]
[im 1/2]
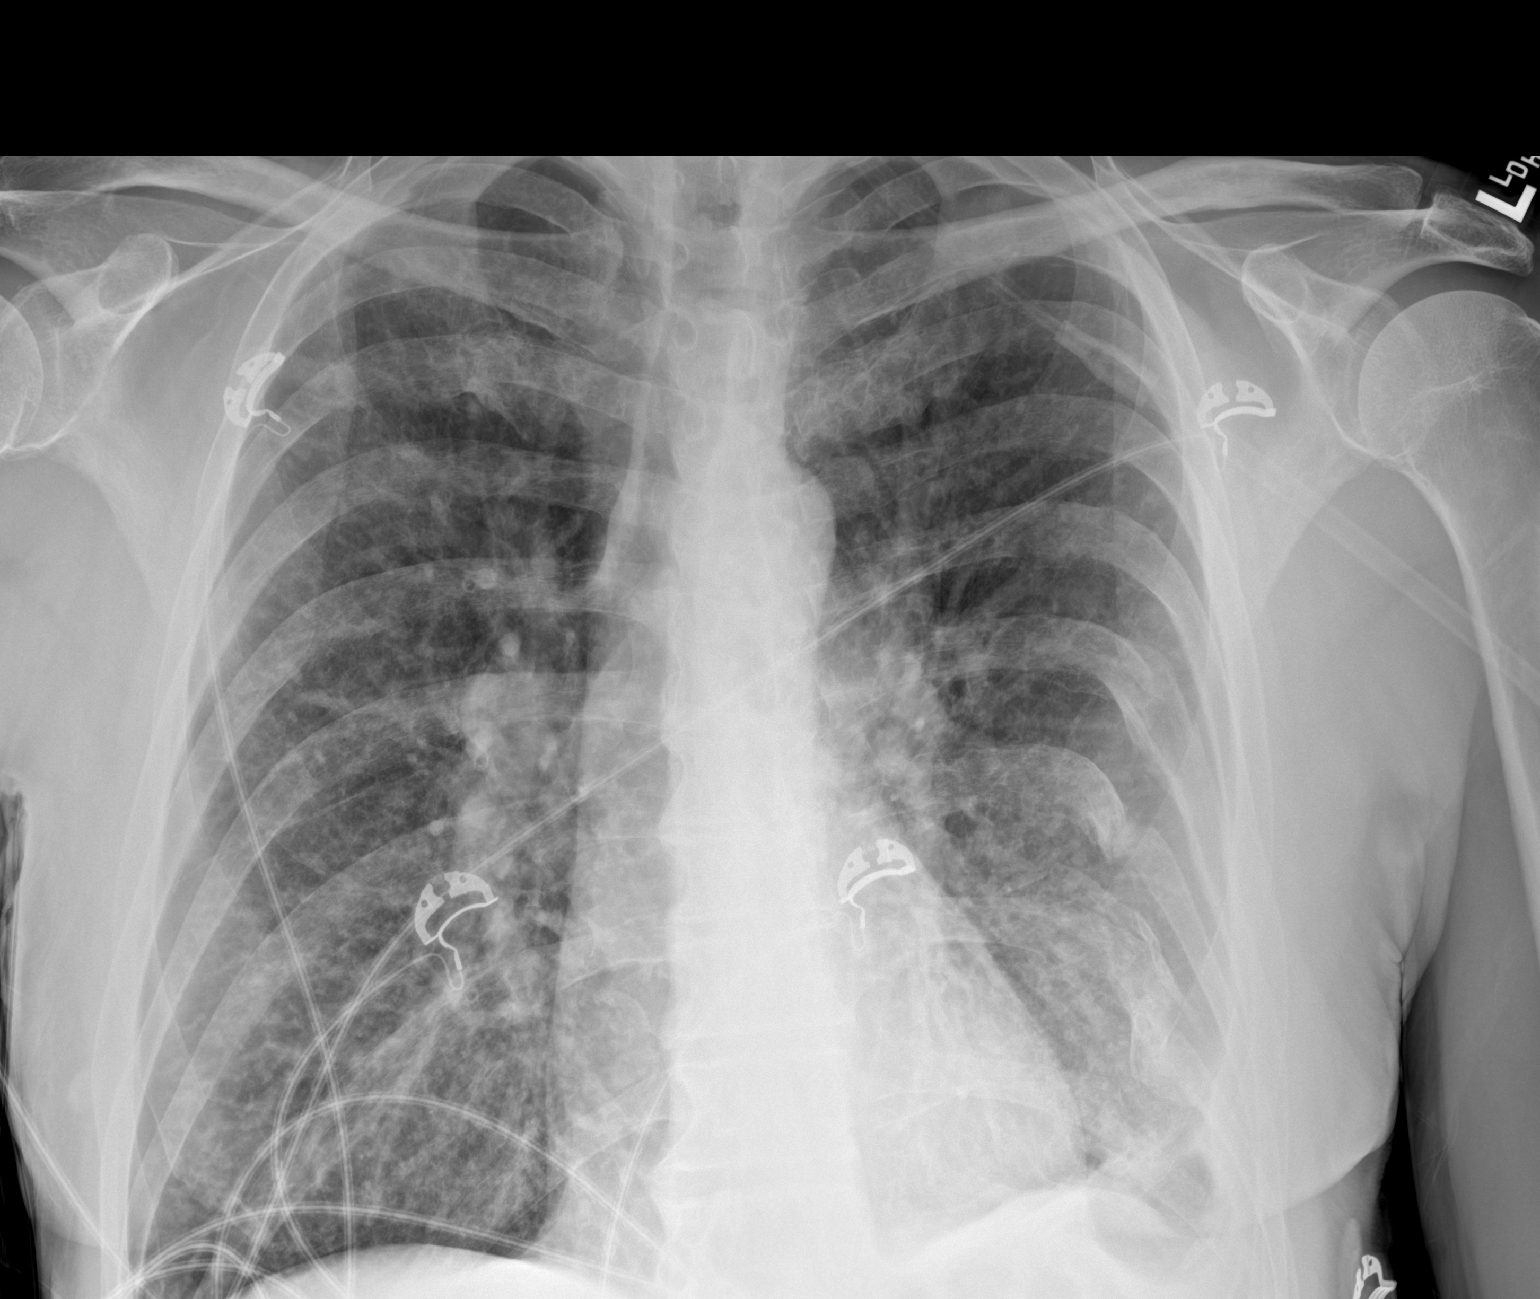
[im 2/2]
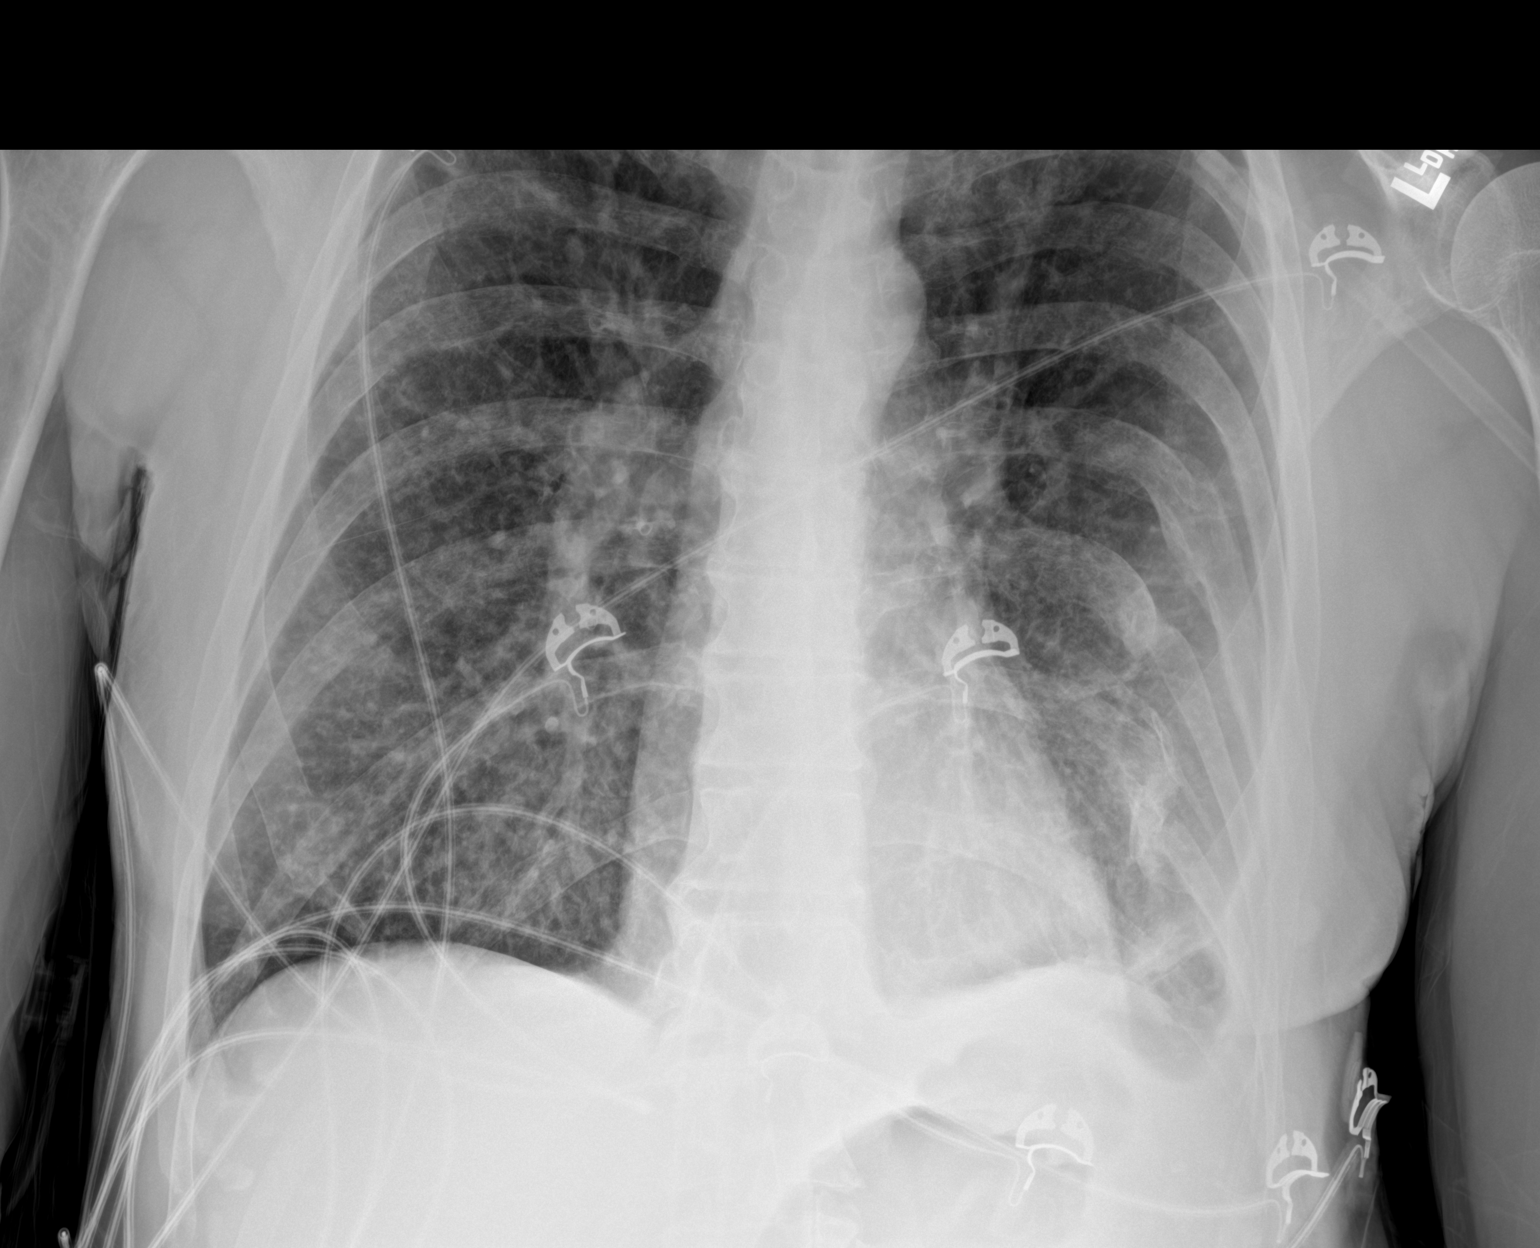

[2 of 2 positions shown; findings below may reference images not displayed]

FINDINGS: Trachea midline.  Cardiomediastinal contours are normal.

No lobar consolidation.

Increased interstitial markings particularly in the LEFT lung base.
Suggestion of LEFT-sided effusion versus pleural-parenchymal
scarring

Fractures of ribs 7 through 10 on the LEFT posteriorly appear to
show callus formation likely subacute or chronic. These were not
present on the prior study.
IMPRESSION: 1. Increased interstitial markings particularly in the LEFT lung
base with small LEFT effusion versus pleural-parenchymal scarring.
Findings could represent atypical infection versus asymmetric edema.
2. Small effusion versus pleural and parenchymal scarring at the
LEFT lung base.
3. Fractures of ribs 7 through 10 on the LEFT posteriorly appear to
show callus formation likely subacute or chronic. Correlate with any
trauma history and any pain in this location, findings are new since
7637 without recent comparison.

## 2022-05-28 ENCOUNTER — Inpatient Hospital Stay: Payer: Medicare HMO

## 2022-05-28 VITALS — BP 110/71 | HR 105 | Temp 97.0°F | Resp 16

## 2022-05-28 DIAGNOSIS — D509 Iron deficiency anemia, unspecified: Secondary | ICD-10-CM

## 2022-05-28 IMAGING — DX DG CHEST 1V PORT
1 series · 2 of 2 positions shown · non-contrast
Comparison: 03/26/2020

CLINICAL DATA: Dyspnea.  Shortness of breath and cough.

EXAM:
PORTABLE CHEST 1 VIEW

[Series 1: chest ap · 0.14mm/px · 2 of 2 slices shown]
[im 1/2]
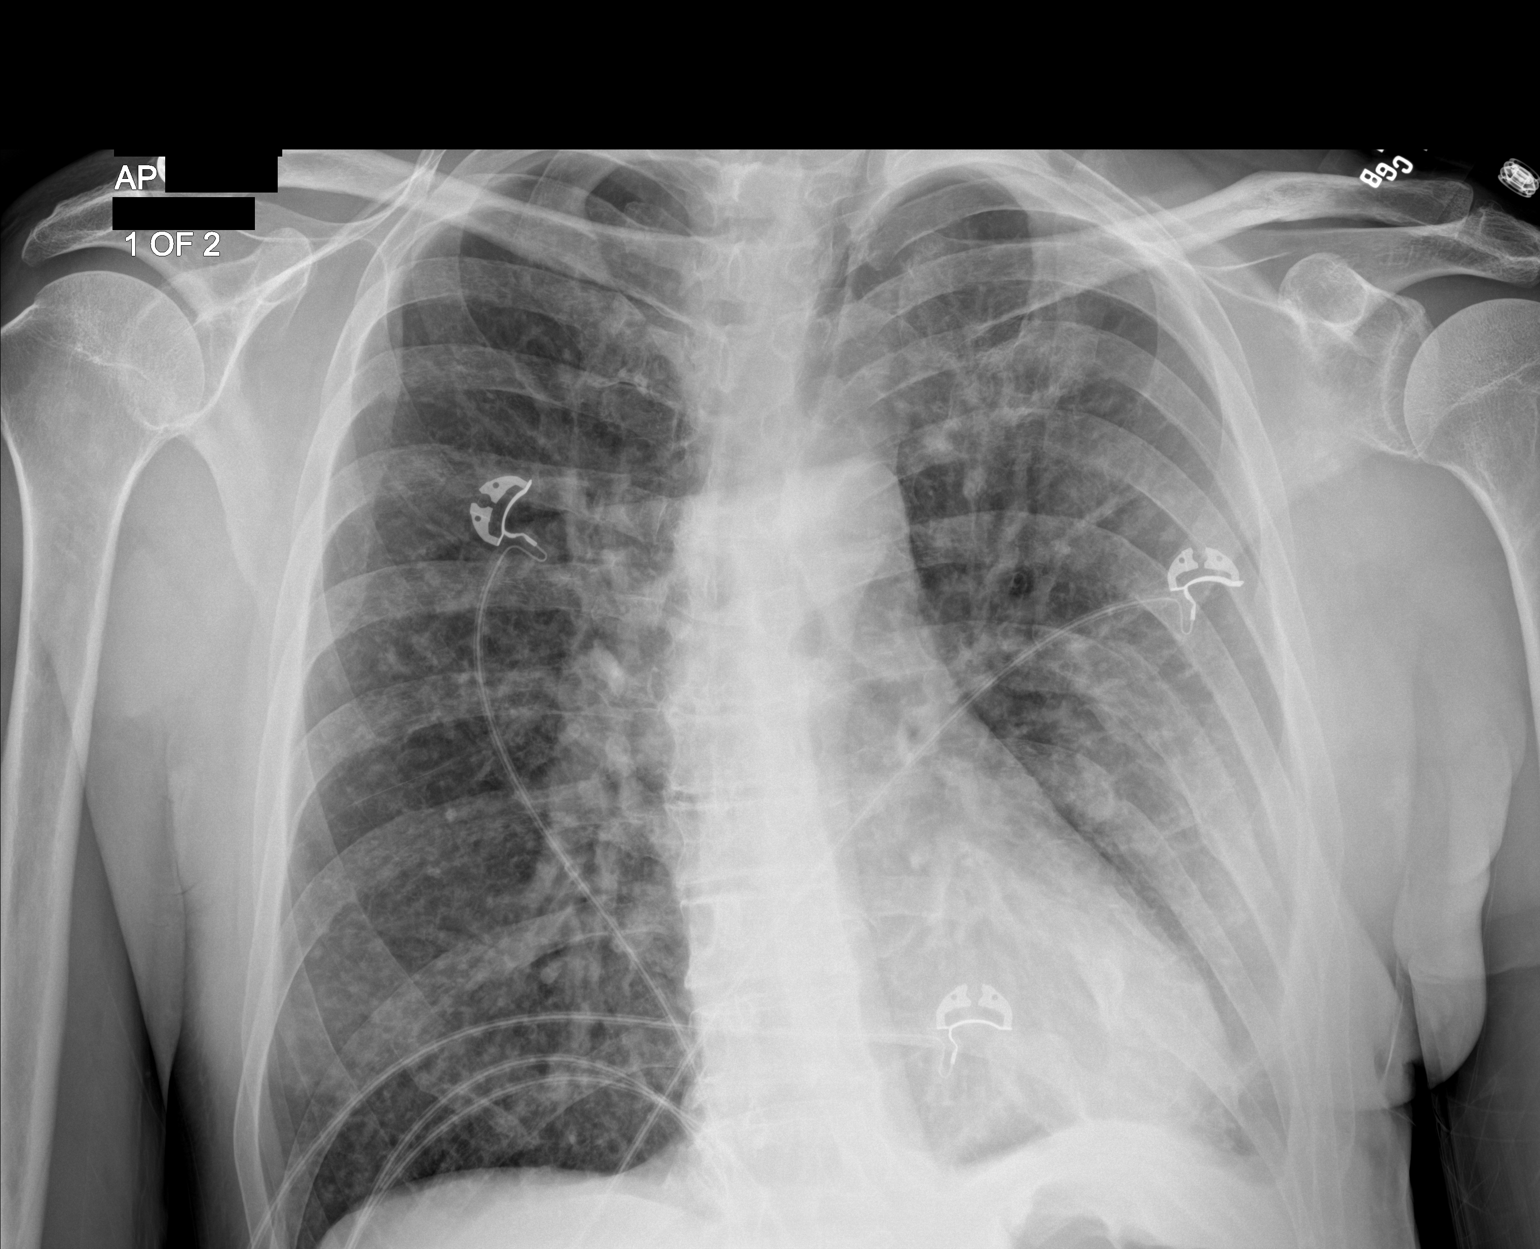
[im 2/2]
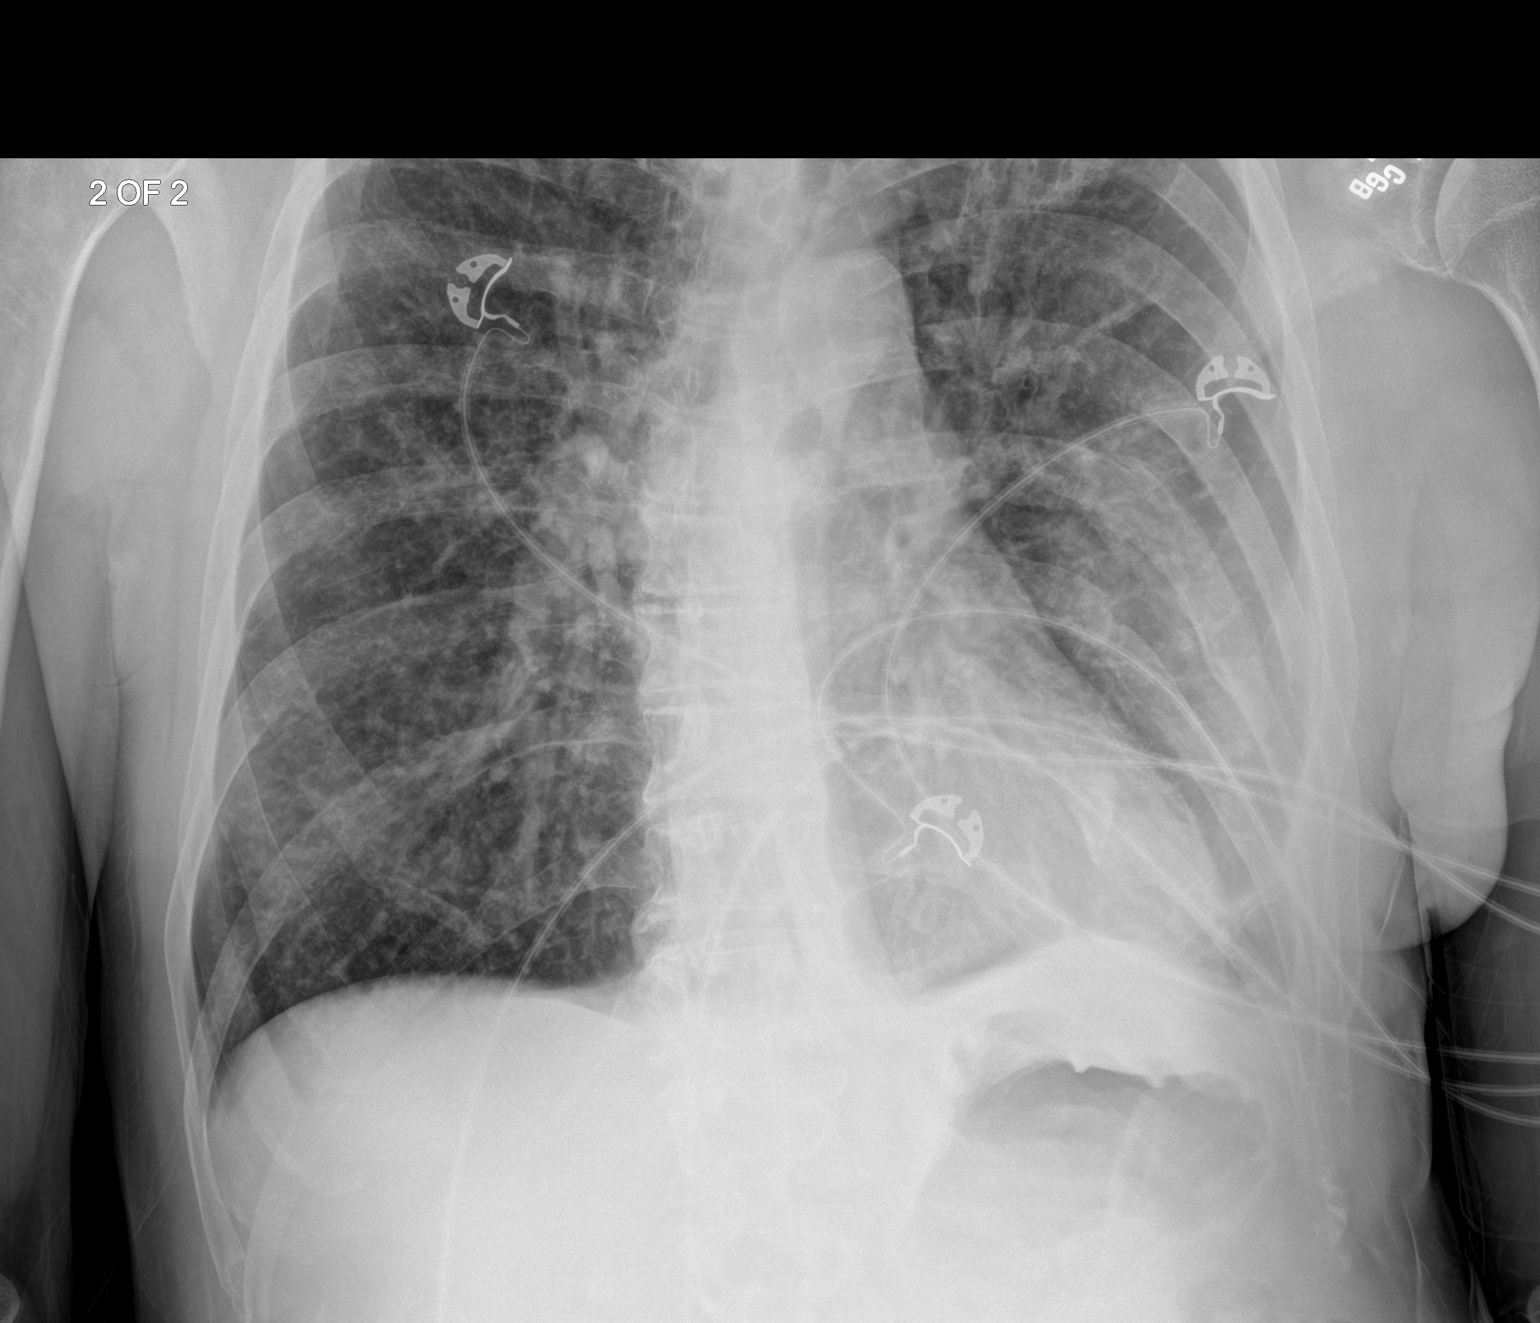

[2 of 2 positions shown; findings below may reference images not displayed]

FINDINGS: Normal heart size. Pleuroparenchymal scarring with blunting of the
left costophrenic angle is again noted. Diffuse reticulonodular
opacities are noted throughout both lungs. No lobar consolidation or
atelectasis. No acute bone abnormality noted. Remote left posterior
rib fractures.
IMPRESSION: 1. Persistent diffuse bilateral reticular and nodular opacities
compatible with inflammatory/infectious bronchiolitis.
2. Chronic left pleural thickening and scarring.

## 2022-05-28 MED ORDER — SODIUM CHLORIDE 0.9 % IV SOLN
200.0000 mg | Freq: Once | INTRAVENOUS | Status: AC
  Administered 2022-05-28: 200 mg via INTRAVENOUS
  Filled 2022-05-28: qty 200

## 2022-05-28 MED ORDER — SODIUM CHLORIDE 0.9 % IV SOLN
Freq: Once | INTRAVENOUS | Status: AC
  Filled 2022-05-28: qty 250

## 2022-05-28 NOTE — Patient Instructions (Signed)

## 2022-06-02 ENCOUNTER — Inpatient Hospital Stay: Payer: Medicare HMO

## 2022-06-02 VITALS — BP 115/71 | HR 120 | Temp 98.8°F | Resp 18

## 2022-06-02 DIAGNOSIS — D509 Iron deficiency anemia, unspecified: Secondary | ICD-10-CM

## 2022-06-02 MED ORDER — SODIUM CHLORIDE 0.9 % IV SOLN
Freq: Once | INTRAVENOUS | Status: AC
  Filled 2022-06-02: qty 250

## 2022-06-02 MED ORDER — SODIUM CHLORIDE 0.9 % IV SOLN
200.0000 mg | Freq: Once | INTRAVENOUS | Status: AC
  Administered 2022-06-02: 200 mg via INTRAVENOUS
  Filled 2022-06-02: qty 200

## 2022-06-03 MED FILL — Iron Sucrose Inj 20 MG/ML (Fe Equiv): INTRAVENOUS | Qty: 10 | Status: AC

## 2022-06-04 ENCOUNTER — Inpatient Hospital Stay: Payer: Medicare HMO

## 2022-06-04 VITALS — BP 116/76 | HR 100 | Temp 98.1°F | Resp 18

## 2022-06-04 DIAGNOSIS — D509 Iron deficiency anemia, unspecified: Secondary | ICD-10-CM | POA: Diagnosis not present

## 2022-06-04 MED ORDER — SODIUM CHLORIDE 0.9 % IV SOLN
Freq: Once | INTRAVENOUS | Status: AC
  Filled 2022-06-04: qty 250

## 2022-06-04 MED ORDER — SODIUM CHLORIDE 0.9 % IV SOLN
200.0000 mg | Freq: Once | INTRAVENOUS | Status: AC
  Administered 2022-06-04: 200 mg via INTRAVENOUS
  Filled 2022-06-04: qty 200

## 2022-06-11 ENCOUNTER — Inpatient Hospital Stay: Payer: Medicare HMO

## 2022-06-11 VITALS — BP 122/68 | HR 112 | Temp 97.8°F

## 2022-06-11 DIAGNOSIS — D509 Iron deficiency anemia, unspecified: Secondary | ICD-10-CM | POA: Diagnosis not present

## 2022-06-11 MED ORDER — SODIUM CHLORIDE 0.9 % IV SOLN
200.0000 mg | Freq: Once | INTRAVENOUS | Status: AC
  Administered 2022-06-11: 200 mg via INTRAVENOUS
  Filled 2022-06-11: qty 200

## 2022-06-11 MED ORDER — SODIUM CHLORIDE 0.9 % IV SOLN
Freq: Once | INTRAVENOUS | Status: AC
  Filled 2022-06-11: qty 250

## 2022-06-11 NOTE — Patient Instructions (Addendum)
Patient was seen in the infusion room today and received 200 mg of Venofer IV without complication. Tolerated well. No issues. Keep next appointment as scheduled.

## 2022-06-17 ENCOUNTER — Encounter: Payer: Self-pay | Admitting: Oncology

## 2022-06-19 ENCOUNTER — Other Ambulatory Visit: Payer: Medicare HMO

## 2022-07-15 ENCOUNTER — Encounter: Payer: Self-pay | Admitting: Oncology

## 2022-07-15 ENCOUNTER — Emergency Department: Payer: Medicare HMO

## 2022-07-15 ENCOUNTER — Other Ambulatory Visit: Payer: Self-pay

## 2022-07-15 ENCOUNTER — Inpatient Hospital Stay
Admission: EM | Admit: 2022-07-15 | Discharge: 2022-07-27 | DRG: 871 | Disposition: A | Discharge status: Home / Self Care | Payer: Medicare HMO | Attending: Internal Medicine | Admitting: Internal Medicine

## 2022-07-15 DIAGNOSIS — Z681 Body mass index (BMI) 19 or less, adult: Secondary | ICD-10-CM | POA: Diagnosis not present

## 2022-07-15 DIAGNOSIS — Z800 Family history of malignant neoplasm of digestive organs: Secondary | ICD-10-CM

## 2022-07-15 DIAGNOSIS — E871 Hypo-osmolality and hyponatremia: Secondary | ICD-10-CM | POA: Diagnosis present

## 2022-07-15 DIAGNOSIS — J189 Pneumonia, unspecified organism: Secondary | ICD-10-CM | POA: Diagnosis present

## 2022-07-15 DIAGNOSIS — N10 Acute tubulo-interstitial nephritis: Secondary | ICD-10-CM | POA: Diagnosis present

## 2022-07-15 DIAGNOSIS — J852 Abscess of lung without pneumonia: Secondary | ICD-10-CM | POA: Diagnosis present

## 2022-07-15 DIAGNOSIS — D75839 Thrombocytosis, unspecified: Secondary | ICD-10-CM | POA: Clinically undetermined

## 2022-07-15 DIAGNOSIS — F17200 Nicotine dependence, unspecified, uncomplicated: Secondary | ICD-10-CM | POA: Diagnosis present

## 2022-07-15 DIAGNOSIS — Z1152 Encounter for screening for COVID-19: Secondary | ICD-10-CM | POA: Diagnosis not present

## 2022-07-15 DIAGNOSIS — R933 Abnormal findings on diagnostic imaging of other parts of digestive tract: Secondary | ICD-10-CM | POA: Diagnosis present

## 2022-07-15 DIAGNOSIS — I3139 Other pericardial effusion (noninflammatory): Secondary | ICD-10-CM | POA: Diagnosis not present

## 2022-07-15 DIAGNOSIS — J440 Chronic obstructive pulmonary disease with acute lower respiratory infection: Secondary | ICD-10-CM | POA: Diagnosis present

## 2022-07-15 DIAGNOSIS — I319 Disease of pericardium, unspecified: Secondary | ICD-10-CM | POA: Diagnosis present

## 2022-07-15 DIAGNOSIS — J188 Other pneumonia, unspecified organism: Secondary | ICD-10-CM | POA: Diagnosis present

## 2022-07-15 DIAGNOSIS — D649 Anemia, unspecified: Secondary | ICD-10-CM | POA: Diagnosis not present

## 2022-07-15 DIAGNOSIS — I10 Essential (primary) hypertension: Secondary | ICD-10-CM | POA: Diagnosis present

## 2022-07-15 DIAGNOSIS — J9811 Atelectasis: Secondary | ICD-10-CM | POA: Diagnosis present

## 2022-07-15 DIAGNOSIS — F17210 Nicotine dependence, cigarettes, uncomplicated: Secondary | ICD-10-CM | POA: Diagnosis present

## 2022-07-15 DIAGNOSIS — F319 Bipolar disorder, unspecified: Secondary | ICD-10-CM | POA: Diagnosis present

## 2022-07-15 DIAGNOSIS — F200 Paranoid schizophrenia: Secondary | ICD-10-CM | POA: Diagnosis present

## 2022-07-15 DIAGNOSIS — E43 Unspecified severe protein-calorie malnutrition: Secondary | ICD-10-CM | POA: Diagnosis present

## 2022-07-15 DIAGNOSIS — I959 Hypotension, unspecified: Secondary | ICD-10-CM | POA: Diagnosis present

## 2022-07-15 DIAGNOSIS — A419 Sepsis, unspecified organism: Secondary | ICD-10-CM | POA: Diagnosis present

## 2022-07-15 DIAGNOSIS — J984 Other disorders of lung: Secondary | ICD-10-CM

## 2022-07-15 DIAGNOSIS — K5900 Constipation, unspecified: Secondary | ICD-10-CM | POA: Clinically undetermined

## 2022-07-15 DIAGNOSIS — Z789 Other specified health status: Secondary | ICD-10-CM

## 2022-07-15 DIAGNOSIS — Z720 Tobacco use: Secondary | ICD-10-CM

## 2022-07-15 DIAGNOSIS — R440 Auditory hallucinations: Secondary | ICD-10-CM | POA: Diagnosis present

## 2022-07-15 DIAGNOSIS — R636 Underweight: Secondary | ICD-10-CM | POA: Diagnosis present

## 2022-07-15 DIAGNOSIS — K229 Disease of esophagus, unspecified: Secondary | ICD-10-CM | POA: Diagnosis present

## 2022-07-15 DIAGNOSIS — F203 Undifferentiated schizophrenia: Secondary | ICD-10-CM | POA: Diagnosis present

## 2022-07-15 DIAGNOSIS — Z79899 Other long term (current) drug therapy: Secondary | ICD-10-CM

## 2022-07-15 DIAGNOSIS — J9819 Other pulmonary collapse: Secondary | ICD-10-CM | POA: Diagnosis present

## 2022-07-15 DIAGNOSIS — D509 Iron deficiency anemia, unspecified: Secondary | ICD-10-CM | POA: Diagnosis present

## 2022-07-15 DIAGNOSIS — J850 Gangrene and necrosis of lung: Secondary | ICD-10-CM | POA: Diagnosis present

## 2022-07-15 DIAGNOSIS — N12 Tubulo-interstitial nephritis, not specified as acute or chronic: Secondary | ICD-10-CM

## 2022-07-15 DIAGNOSIS — Z593 Problems related to living in residential institution: Secondary | ICD-10-CM

## 2022-07-15 DIAGNOSIS — I76 Septic arterial embolism: Secondary | ICD-10-CM | POA: Diagnosis not present

## 2022-07-15 DIAGNOSIS — Z23 Encounter for immunization: Secondary | ICD-10-CM | POA: Diagnosis present

## 2022-07-15 DIAGNOSIS — J449 Chronic obstructive pulmonary disease, unspecified: Secondary | ICD-10-CM | POA: Diagnosis present

## 2022-07-15 HISTORY — DX: Sepsis, unspecified organism: A41.9

## 2022-07-15 LAB — CBC
HCT: 31.3 % — ABNORMAL LOW (ref 39.0–52.0)
Hemoglobin: 10.1 g/dL — ABNORMAL LOW (ref 13.0–17.0)
MCH: 26 pg (ref 26.0–34.0)
MCHC: 32.3 g/dL (ref 30.0–36.0)
MCV: 80.5 fL (ref 80.0–100.0)
Platelets: 610 10*3/uL — ABNORMAL HIGH (ref 150–400)
RBC: 3.89 MIL/uL — ABNORMAL LOW (ref 4.22–5.81)
RDW: 15.6 % — ABNORMAL HIGH (ref 11.5–15.5)
WBC: 27.4 10*3/uL — ABNORMAL HIGH (ref 4.0–10.5)
nRBC: 0 % (ref 0.0–0.2)

## 2022-07-15 LAB — COMPREHENSIVE METABOLIC PANEL
ALT: 44 U/L (ref 0–44)
AST: 24 U/L (ref 15–41)
Albumin: 2.1 g/dL — ABNORMAL LOW (ref 3.5–5.0)
Alkaline Phosphatase: 110 U/L (ref 38–126)
Anion gap: 8 (ref 5–15)
BUN: 11 mg/dL (ref 6–20)
CO2: 28 mmol/L (ref 22–32)
Calcium: 9.6 mg/dL (ref 8.9–10.3)
Chloride: 94 mmol/L — ABNORMAL LOW (ref 98–111)
Creatinine, Ser: 0.77 mg/dL (ref 0.61–1.24)
GFR, Estimated: >60 mL/min (ref >60)
Glucose, Bld: 123 mg/dL — ABNORMAL HIGH (ref 70–99)
Potassium: 3.8 mmol/L (ref 3.5–5.1)
Sodium: 130 mmol/L — ABNORMAL LOW (ref 135–145)
Total Bilirubin: 0.7 mg/dL (ref 0.3–1.2)
Total Protein: 7.2 g/dL (ref 6.5–8.1)

## 2022-07-15 LAB — URINE DRUG SCREEN, QUALITATIVE (ARMC ONLY)
Amphetamines, Ur Screen: NOT DETECTED
Barbiturates, Ur Screen: NOT DETECTED
Benzodiazepine, Ur Scrn: NOT DETECTED
Cannabinoid 50 Ng, Ur ~~LOC~~: POSITIVE — AB
Cocaine Metabolite,Ur ~~LOC~~: NOT DETECTED
MDMA (Ecstasy)Ur Screen: NOT DETECTED
Methadone Scn, Ur: NOT DETECTED
Opiate, Ur Screen: NOT DETECTED
Phencyclidine (PCP) Ur S: NOT DETECTED
Tricyclic, Ur Screen: NOT DETECTED

## 2022-07-15 LAB — URINALYSIS, ROUTINE W REFLEX MICROSCOPIC
Bilirubin Urine: NEGATIVE
Glucose, UA: NEGATIVE mg/dL
Hgb urine dipstick: NEGATIVE
Ketones, ur: NEGATIVE mg/dL
Leukocytes,Ua: NEGATIVE
Nitrite: NEGATIVE
Protein, ur: NEGATIVE mg/dL
Specific Gravity, Urine: 1.009 (ref 1.005–1.030)
pH: 5 (ref 5.0–8.0)

## 2022-07-15 LAB — RESP PANEL BY RT-PCR (RSV, FLU A&B, COVID)  RVPGX2
Influenza A by PCR: NEGATIVE
Influenza B by PCR: NEGATIVE
Resp Syncytial Virus by PCR: NEGATIVE
SARS Coronavirus 2 by RT PCR: NEGATIVE

## 2022-07-15 LAB — ACETAMINOPHEN LEVEL: Acetaminophen (Tylenol), Serum: <10 ug/mL — ABNORMAL LOW (ref 10–30)

## 2022-07-15 LAB — LACTIC ACID, PLASMA
Lactic Acid, Venous: 1.3 mmol/L (ref 0.5–1.9)
Lactic Acid, Venous: 1.7 mmol/L (ref 0.5–1.9)

## 2022-07-15 LAB — SALICYLATE LEVEL: Salicylate Lvl: <7 mg/dL — ABNORMAL LOW (ref 7.0–30.0)

## 2022-07-15 LAB — ETHANOL: Alcohol, Ethyl (B): <10 mg/dL (ref <10)

## 2022-07-15 MED ORDER — BUDESON-GLYCOPYRROL-FORMOTEROL 160-9-4.8 MCG/ACT IN AERO: 1.0000 | INHALATION_SPRAY | Freq: Every day | RESPIRATORY_TRACT | Status: DC

## 2022-07-15 MED ORDER — SODIUM CHLORIDE 0.9 % IV BOLUS
1000.0000 mL | Freq: Once | INTRAVENOUS | Status: AC
  Administered 2022-07-15: 1000 mL via INTRAVENOUS

## 2022-07-15 MED ORDER — VANCOMYCIN HCL 750 MG/150ML IV SOLN
750.0000 mg | Freq: Two times a day (BID) | INTRAVENOUS | Status: DC
  Administered 2022-07-16: 750 mg via INTRAVENOUS
  Filled 2022-07-15: qty 150

## 2022-07-15 MED ORDER — LACTATED RINGERS IV SOLN: INTRAVENOUS | Status: AC

## 2022-07-15 MED ORDER — SODIUM CHLORIDE 0.9 % IV SOLN
2.0000 g | Freq: Once | INTRAVENOUS | Status: AC
  Administered 2022-07-15: 2 g via INTRAVENOUS
  Filled 2022-07-15: qty 12.5

## 2022-07-15 MED ORDER — ONDANSETRON HCL 4 MG PO TABS: 4.0000 mg | ORAL_TABLET | Freq: Four times a day (QID) | ORAL | Status: DC | PRN

## 2022-07-15 MED ORDER — MONTELUKAST SODIUM 10 MG PO TABS
10.0000 mg | ORAL_TABLET | Freq: Every day | ORAL | Status: DC
  Administered 2022-07-15 – 2022-07-27 (×13): 10 mg via ORAL
  Filled 2022-07-15 (×13): qty 1

## 2022-07-15 MED ORDER — ATORVASTATIN CALCIUM 20 MG PO TABS
20.0000 mg | ORAL_TABLET | Freq: Every day | ORAL | Status: DC
  Administered 2022-07-16 – 2022-07-27 (×12): 20 mg via ORAL
  Filled 2022-07-15 (×13): qty 1

## 2022-07-15 MED ORDER — ACETAMINOPHEN 325 MG PO TABS
650.0000 mg | ORAL_TABLET | Freq: Four times a day (QID) | ORAL | Status: DC | PRN
  Administered 2022-07-19: 650 mg via ORAL
  Filled 2022-07-15: qty 2

## 2022-07-15 MED ORDER — ALBUTEROL SULFATE HFA 108 (90 BASE) MCG/ACT IN AERS: 2.0000 | INHALATION_SPRAY | Freq: Four times a day (QID) | RESPIRATORY_TRACT | Status: DC | PRN

## 2022-07-15 MED ORDER — ALBUTEROL SULFATE (2.5 MG/3ML) 0.083% IN NEBU: 2.5000 mg | INHALATION_SOLUTION | Freq: Four times a day (QID) | RESPIRATORY_TRACT | Status: DC | PRN

## 2022-07-15 MED ORDER — UMECLIDINIUM-VILANTEROL 62.5-25 MCG/ACT IN AEPB: 1.0000 | INHALATION_SPRAY | Freq: Every day | RESPIRATORY_TRACT | Status: DC

## 2022-07-15 MED ORDER — OXYBUTYNIN CHLORIDE ER 10 MG PO TB24
10.0000 mg | ORAL_TABLET | Freq: Every day | ORAL | Status: DC
  Administered 2022-07-16 – 2022-07-27 (×12): 10 mg via ORAL
  Filled 2022-07-15 (×12): qty 1

## 2022-07-15 MED ORDER — PAROXETINE HCL 20 MG PO TABS
20.0000 mg | ORAL_TABLET | Freq: Every day | ORAL | Status: DC
  Administered 2022-07-15 – 2022-07-27 (×13): 20 mg via ORAL
  Filled 2022-07-15 (×14): qty 1

## 2022-07-15 MED ORDER — ACETAMINOPHEN 650 MG RE SUPP: 650.0000 mg | Freq: Four times a day (QID) | RECTAL | Status: DC | PRN

## 2022-07-15 MED ORDER — BUDESONIDE 0.25 MG/2ML IN SUSP: 0.2500 mg | Freq: Two times a day (BID) | RESPIRATORY_TRACT | Status: DC

## 2022-07-15 MED ORDER — SODIUM CHLORIDE 0.9 % IV SOLN
2.0000 g | Freq: Three times a day (TID) | INTRAVENOUS | Status: DC
  Administered 2022-07-16: 2 g via INTRAVENOUS
  Filled 2022-07-15 (×2): qty 12.5

## 2022-07-15 MED ORDER — SODIUM CHLORIDE 0.9 % IV SOLN
500.0000 mg | Freq: Once | INTRAVENOUS | Status: AC
  Administered 2022-07-15: 500 mg via INTRAVENOUS
  Filled 2022-07-15: qty 5

## 2022-07-15 MED ORDER — VANCOMYCIN HCL IN DEXTROSE 1-5 GM/200ML-% IV SOLN
1000.0000 mg | Freq: Once | INTRAVENOUS | Status: AC
  Administered 2022-07-15: 1000 mg via INTRAVENOUS
  Filled 2022-07-15: qty 200

## 2022-07-15 MED ORDER — SODIUM CHLORIDE 0.9 % IV SOLN: 500.0000 mg | INTRAVENOUS | Status: DC

## 2022-07-15 MED ORDER — ONDANSETRON HCL 4 MG/2ML IJ SOLN: 4.0000 mg | Freq: Four times a day (QID) | INTRAMUSCULAR | Status: DC | PRN

## 2022-07-15 MED ORDER — UMECLIDINIUM BROMIDE 62.5 MCG/ACT IN AEPB
1.0000 | INHALATION_SPRAY | Freq: Every day | RESPIRATORY_TRACT | Status: DC
  Administered 2022-07-16 – 2022-07-27 (×12): 1 via RESPIRATORY_TRACT
  Filled 2022-07-15 (×2): qty 7

## 2022-07-15 MED ORDER — SODIUM CHLORIDE 0.9 % IV BOLUS (SEPSIS)
500.0000 mL | Freq: Once | INTRAVENOUS | Status: AC
  Administered 2022-07-15: 500 mL via INTRAVENOUS

## 2022-07-15 MED ORDER — IOHEXOL 300 MG/ML  SOLN
60.0000 mL | Freq: Once | INTRAMUSCULAR | Status: AC | PRN
  Administered 2022-07-15: 60 mL via INTRAVENOUS

## 2022-07-15 MED ORDER — FLUTICASONE FUROATE-VILANTEROL 200-25 MCG/ACT IN AEPB
1.0000 | INHALATION_SPRAY | Freq: Every day | RESPIRATORY_TRACT | Status: DC
  Administered 2022-07-16 – 2022-07-27 (×12): 1 via RESPIRATORY_TRACT
  Filled 2022-07-15: qty 28

## 2022-07-15 MED ORDER — VANCOMYCIN HCL 500 MG/100ML IV SOLN
500.0000 mg | Freq: Once | INTRAVENOUS | Status: AC
  Administered 2022-07-15: 500 mg via INTRAVENOUS
  Filled 2022-07-15 (×2): qty 100

## 2022-07-15 MED ORDER — IOHEXOL 300 MG/ML  SOLN
100.0000 mL | Freq: Once | INTRAMUSCULAR | Status: AC | PRN
  Administered 2022-07-15: 100 mL via INTRAVENOUS

## 2022-07-15 MED ORDER — HYDROXYZINE HCL 50 MG PO TABS
25.0000 mg | ORAL_TABLET | Freq: Three times a day (TID) | ORAL | Status: DC
  Administered 2022-07-15 – 2022-07-27 (×35): 25 mg via ORAL
  Filled 2022-07-15 (×35): qty 1

## 2022-07-15 MED ORDER — HYDROCODONE-ACETAMINOPHEN 5-325 MG PO TABS
1.0000 | ORAL_TABLET | ORAL | Status: DC | PRN
  Administered 2022-07-15: 1 via ORAL
  Administered 2022-07-19 – 2022-07-27 (×7): 2 via ORAL
  Filled 2022-07-15 (×3): qty 2
  Filled 2022-07-15: qty 1
  Filled 2022-07-15 (×5): qty 2

## 2022-07-15 NOTE — ED Notes (Addendum)
Pt in hallway bed.  Pt alert  iv started and fluids infusing.  Pt denies si or hi.  Pt reports that his life is hard and feels like giving up at times.  Pt states he thinks his meds are not working.   Pt calm

## 2022-07-15 NOTE — Assessment & Plan Note (Addendum)
Hx of Paranoid Schizophrenia and Bipolar 1 Disorder Patient appears stable at this time Seen by psyhciatry.  Per their NP: "He can continue the oral Invega and follow-up with his outpatient provider. Per the group home, his initial dose of [name] is due Feb. 08".

## 2022-07-15 NOTE — Consult Note (Signed)
PHARMACY -  BRIEF ANTIBIOTIC NOTE   Pharmacy has received consult(s) for Vancomycin and Cefepime from an ED provider.  The patient's profile has been reviewed for ht/wt/allergies/indication/available labs.    One time order(s) placed for : Vancomycin 1gm IVPB x 1 dose Cefepime 2gm IVPB x 1 dose  Further antibiotics/pharmacy consults should be ordered by admitting physician if indicated.                       Thank you, Tahj Lindseth Rodriguez-Guzman PharmD, BCPS 07/15/2022 7:04 PM

## 2022-07-15 NOTE — Assessment & Plan Note (Addendum)
Sodium trending down 136 >> 134 >> 132. Suspect hypovolemia, unclear how much he is eating and drinking.  Also potential malignancy is a concern. Given gentle IV fluids.   Na stable 132-134  --encourage PO intake --repeat BMP in AM

## 2022-07-15 NOTE — ED Notes (Signed)
Pt back and fourth to bathroom to void.  Iv meds infusing.  Pt in hallway bed.

## 2022-07-15 NOTE — Assessment & Plan Note (Addendum)
As outlined

## 2022-07-15 NOTE — Assessment & Plan Note (Addendum)
Collapse of left lung No significant respiratory complaints aside from mild cough. Etiology TBD - infectious vs malignancy given all the imaging findings. Treated initially with broad spectrum IV antibiotics >> Unasyn --Now on Augmentin - continue --ID is following --Pulmonology consulted, signed off. They plan for outpatient evaluation, PET scan recommended. Pt declines further evaluation to determine if malignancy --Aspiration precautions --Speech therapy evaluation --Supplemental oxygen if needed --Antitussives, albuterol as needed and supportive care

## 2022-07-15 NOTE — ED Notes (Signed)
Pt dressed out into hospital attire. Pt belongings to include: 1 black jacket 1 blue shirt 1 gray shirt 2 black shoes 2 black socks 1 blue jeans 1 blue shirt 1 brown belt 1 black phone 1 wallet

## 2022-07-15 NOTE — Assessment & Plan Note (Addendum)
CT shows thickening and hyperenhancement of the visceral and parietal pericardium with central low attenuation fluid compatible with pericarditis.Marland KitchenMarland KitchenDifferential consideration includes septic emboli or metastases. Echocardiogram 07/16/22 showed LVEF 48-25%, grade 1 diastolic dysfunction. Pt seems asymptomatic, no chest pain complaints or dyspnea. --Consider cardiology consult  --Will pursue biopsy by EGD this admission

## 2022-07-15 NOTE — Assessment & Plan Note (Addendum)
Suspect related to underlying malignancy He was given IV Venofer 200 mg in November for incidental finding of anemia on routine blood work. Hemoglobin 10.1 on admission, down from 12.3 in Nov 2023. --Continue to trend hemoglobin  --Will defer iron infusions given possible infection --Hematology consult vs follow up
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
## 2022-07-15 NOTE — H&P (Signed)
History and Physical    Patient: Kenneth [name]. OAC:[id number] DOB: 1961-02-24 DOA: 07/15/2022 DOS: the patient was seen and examined on 07/15/2022 PCP: Pcp, No  Patient coming from: Home  Chief Complaint:  Chief Complaint  Patient presents with   Hallucinations    HPI: Kenneth [name]. is a 62 y.o. male with medical history significant for Paranoid schizophrenia, hypertension, diagnosed with iron deficiency anemia in November 2023 for which he received Venofer x 1 by oncology with recommendation for further treatment and colonoscopy but subsequently lost to follow-up, who presents to the ED with An initial complaint of auditory hallucinations.  He is compliant with his Invega shots.  Patient otherwise felt normal and his usual self however workup in the ED showed multiple worrisome findings including pulmonary abscess with left lung collapse among other concerning findings and hospitalization was requested.  Patient has a cough that developed in the past week but denies, shortness of breath, chest pain, abdominal pain, fever or chills.  He denies abdominal pain, nausea, vomiting, blood in the stool, black stool, dysuria and denies headache or visual disturbance.  He endorses weight loss over the past couple months as well as a decreased appetite. ED course and data review: Afebrile, pulse 122 with BP 91/62 and O2 sat 94% on room air.  BMI 18.99, down from 22.65 on 04/21/2022..  On 04/21/2022 had a visit to oncology, BP was 90/63 and he had a low-grade temp of 99.4 when he received Venofer. Labs significant for WBC 27,400 with lactic acid 1.3.  COVID flu and RSV negative.  Hemoglobin 10.1, down from 12.3 on 04/21/2022.  Sodium 130.  UDS positive for cannabinoids but otherwise clean.  Negative acetaminophen and salicylate and EtOH levels.  UA pending. EKG not done in the ED  Imaging of chest abdomen and pelvis showed several concerning findings including necrotic lung abscess, esophageal wall  dilatation, as well as possible renal abscesses as further detailed below: Lungs: Within the left mid lung there is a 3.8 cm cavitary lesion with air-fluid level either due to abscess or necrotic mass with communication with the tracheobronchial tree.Marland KitchenMarland KitchenComplete opacification of the left lung with low-attenuation fluid/debris in the left upper lobe likely due to pneumonia. Kidneys: Patchy ill-defined areas of hypodensity in the left kidney with mild surrounding inflammatory stranding. Findings are concerning for pyelonephritis. Early renal abscesses are not excluded. Pericardium: Thickening and hyperenhancement of the visceral and parietal pericardium with central low attenuation fluid compatible with pericarditis.Marland KitchenMarland KitchenDifferential consideration includes septic emboli or metastases. Esophagus Question circumferential lower esophageal wall thickening. Underlying neoplasm is difficult to exclude   Patient started on vancomycin, azithromycin and cefepime and hospitalist consulted for admission.   Review of Systems: As mentioned in the history of present illness. All other systems reviewed and are negative.  Past Medical History:  Diagnosis Date   Bipolar 1 disorder ([place])    Hypertension    Memory loss    Paranoid schizophrenia ([place])    No past surgical history on file. Social History:  reports that he has been smoking cigarettes. He has never used smokeless tobacco. He reports that he does not currently use alcohol. He reports that he does not use drugs.  No Known Allergies  Family History  Problem Relation Age of Onset   Diabetes Mother    Cancer Mother    Cancer Maternal Grandfather     Prior to Admission medications   Medication Sig Start Date End Date Taking? Authorizing Provider  atorvastatin (LIPITOR)  20 MG tablet Take 20 mg by mouth daily. 04/07/22  Yes [provider]  BREZTRI AEROSPHERE 160-9-4.8 MCG/ACT AERO Inhale 1 puff into the lungs daily. 03/31/22  Yes  [provider]  hydrochlorothiazide (HYDRODIURIL) 12.5 MG tablet Take 12.5 mg by mouth daily. 07/13/22  Yes [provider]  hydrOXYzine (ATARAX) 25 MG tablet Take 25 mg by mouth 3 (three) times daily. 03/27/22  Yes [provider]  lisinopril (ZESTRIL) 5 MG tablet Take 5 mg by mouth daily. 03/27/22  Yes [provider]  montelukast (SINGULAIR) 10 MG tablet Take 10 mg by mouth daily. 04/09/22  Yes [provider]  oxybutynin (DITROPAN-XL) 10 MG 24 hr tablet Take 10 mg by mouth daily. 07/14/22  Yes [provider]  PARoxetine (PAXIL) 20 MG tablet Take 20 mg by mouth daily. 03/27/22  Yes [provider]  albuterol (VENTOLIN HFA) 108 (90 Base) MCG/ACT inhaler Inhale 2 puffs into the lungs every 6 (six) hours as needed for wheezing or shortness of breath. 03/30/20   Wouk, Ailene Rud, MD  INVEGA TRINZA 819 MG/2.63ML injection Inject into the muscle. 03/26/22   [provider]  paliperidone (INVEGA SUSTENNA) 234 MG/1.5ML SUSY injection Inject 234 mg into the muscle every 28 (twenty-eight) days. Patient not taking: Reported on 07/15/2022 04/15/18   Clovis Fredrickson, MD    Physical Exam: Vitals:   07/15/22 1520 07/15/22 1912 07/15/22 1920  BP: 91/62 98/61   Pulse: (!) 122 100   Resp: 18 20   Temp: 98 F (36.7 C) 98 F (36.7 C)   TempSrc:  Oral   SpO2: 94% 96%   Weight:   63.5 kg  Height:   6' (1.829 m)   Physical Exam Vitals and nursing note reviewed.  Constitutional:      General: He is not in acute distress.    Appearance: He is underweight.     Comments: Unkempt appearance  HENT:     Head: Normocephalic and atraumatic.  Cardiovascular:     Rate and Rhythm: Regular rhythm. Tachycardia present.     Heart sounds: Normal heart sounds.  Pulmonary:     Effort: Pulmonary effort is normal.     Breath sounds: Normal breath sounds.  Abdominal:     Palpations: Abdomen is soft.     Tenderness: There is no abdominal  tenderness.  Neurological:     Mental Status: Mental status is at baseline.     Labs on Admission: I have personally reviewed following labs and imaging studies  CBC: Recent Labs  Lab 07/15/22 1519  WBC 27.4*  HGB 10.1*  HCT 31.3*  MCV 80.5  PLT 462*   Basic Metabolic Panel: Recent Labs  Lab 07/15/22 1519  NA 130*  K 3.8  CL 94*  CO2 28  GLUCOSE 123*  BUN 11  CREATININE 0.77  CALCIUM 9.6   GFR: Estimated Creatinine Clearance: 88.2 mL/min (by C-G formula based on SCr of 0.77 mg/dL). Liver Function Tests: Recent Labs  Lab 07/15/22 1519  AST 24  ALT 44  ALKPHOS 110  BILITOT 0.7  PROT 7.2  ALBUMIN 2.1*   No results for input(s): "LIPASE", "AMYLASE" in the last 168 hours. No results for input(s): "AMMONIA" in the last 168 hours. Coagulation Profile: No results for input(s): "INR", "PROTIME" in the last 168 hours. Cardiac Enzymes: No results for input(s): "CKTOTAL", "CKMB", "CKMBINDEX", "TROPONINI" in the last 168 hours. BNP (last 3 results) No results for input(s): "PROBNP" in the last 8760 hours. HbA1C:  No results for input(s): "HGBA1C" in the last 72 hours. CBG: No results for input(s): "GLUCAP" in the last 168 hours. Lipid Profile: No results for input(s): "CHOL", "HDL", "LDLCALC", "TRIG", "CHOLHDL", "LDLDIRECT" in the last 72 hours. Thyroid Function Tests: No results for input(s): "TSH", "T4TOTAL", "FREET4", "T3FREE", "THYROIDAB" in the last 72 hours. Anemia Panel: No results for input(s): "VITAMINB12", "FOLATE", "FERRITIN", "TIBC", "IRON", "RETICCTPCT" in the last 72 hours. Urine analysis:    Component Value Date/Time   COLORURINE AMBER (A) 03/17/2018 0641   APPEARANCEUR CLEAR (A) 03/17/2018 0641   APPEARANCEUR Hazy 05/13/2013 1142   LABSPEC 1.027 03/17/2018 0641   LABSPEC 1.017 05/13/2013 1142   PHURINE 6.0 03/17/2018 0641   GLUCOSEU NEGATIVE 03/17/2018 0641   GLUCOSEU Negative 05/13/2013 1142   HGBUR NEGATIVE 03/17/2018 0641   BILIRUBINUR  NEGATIVE 03/17/2018 0641   BILIRUBINUR Negative 05/13/2013 1142   KETONESUR NEGATIVE 03/17/2018 0641   PROTEINUR 30 (A) 03/17/2018 0641   NITRITE NEGATIVE 03/17/2018 0641   LEUKOCYTESUR NEGATIVE 03/17/2018 0641   LEUKOCYTESUR Negative 05/13/2013 1142    Radiological Exams on Admission: CT Chest W Contrast  Result Date: 07/15/2022 CLINICAL DATA:  Chest wall pain infection or inflammation suspected EXAM: CT CHEST WITH CONTRAST TECHNIQUE: Multidetector CT imaging of the chest was performed during intravenous contrast administration. RADIATION DOSE REDUCTION: This exam was performed according to the departmental dose-optimization program which includes automated exposure control, adjustment of the mA and/or kV according to patient size and/or use of iterative reconstruction technique. CONTRAST:  65mL OMNIPAQUE IOHEXOL 300 MG/ML  SOLN COMPARISON:  Radiographs 07/15/2022 and CT chest angiogram 03/26/2020 FINDINGS: Cardiovascular: Normal heart size. Small pericardial effusion. Mild pericardial thickening and hyperenhancement of the visceral and parietal pericardium anteriorly with central low attenuation fluid. Mediastinum/Nodes: Lobular heterogenous mass centered near the left hilum measures 6.2 x 6.8 cm. This causes severe narrowing of the left lower lobe pulmonary artery in the left mainstem pulmonary artery. Unchanged right hilar lymph node measuring 1.5 x 1.9 cm. New left esophageal node measuring 1.3 cm (2/130). Question lower esophageal wall thickening. Lungs/Pleura: Low-attenuation fluid/debris within the left mainstem bronchus and throughout the left lung which is entirely opacified/collapsed. There is a dominant low-attenuation collection in the left lower lobe with air-fluid level measuring 3.8 cm. This could represent a abscess or necrotic cavitary mass with communication with the bronchi. Diffuse low-attenuation of the left upper lobe likely due to pneumonia. No focal consolidation, pleural  effusion, or pneumothorax in the right lung. There are multiple solid nodules in the right lung. For example 9 mm nodule in the medial right upper lobe (3/80); 1.0 cm nodule in the right lower lobe along the major fissure (3/112); 9 mm cavitary nodule in the right lower lobe (3/89; and right lower lobe subpleural nodule measuring 5 mm (3/116). Intermediate density fluid collection or nodule along the posterior left lower lobe is indeterminate and could represent loculated pleural fluid or subpleural mass (2/132). Low-density fluid along the right upper lobe may be a loculated pleural effusion or developing pneumonia (circa series 2/image 63). Upper Abdomen: Patchy ill-defined rounded heterogenous areas of low attenuation within the mid left kidney as seen on CT abdomen pelvis earlier today. Right adrenal 1.4 cm nodule is stable since CT chest 03/26/2020. Musculoskeletal: Remote left rib fractures. No acute fracture or destructive osseous lesion. IMPRESSION: Heterogenous low-attenuation lobulated mass about the left hilum is indeterminate and may represent a pulmonary abscess versus necrotic mass. Within the left mid lung there is a 3.8 cm cavitary  lesion with air-fluid level either due to abscess or necrotic mass with communication with the tracheobronchial tree. Low-attenuation fluid/debris diffusely fills the left lung bronchi including the left mainstem bronchus. Complete opacification of the left lung with low-attenuation fluid/debris in the left upper lobe likely due to pneumonia. Intermediate density fluid collection or nodule along the posterior left lower lobe is indeterminate and could represent loculated pleural fluid or subpleural mass. Thickening and hyperenhancement of the visceral and parietal pericardium with central low attenuation fluid compatible with pericarditis. Multiple nodules measuring up to 10 mm in the right lung. Differential consideration includes septic emboli or metastases. Unchanged  right hilar lymphadenopathy and new lower paraesophageal lymphadenopathy are indeterminate. Continued attention on follow-up. Question circumferential lower esophageal wall thickening. Underlying neoplasm is difficult to exclude. Left presumed pyelonephritis. See report from CT abdomen and pelvis earlier today for details. Electronically Signed   By: Placido Sou M.D.   On: 07/15/2022 19:21   CT ABDOMEN PELVIS W CONTRAST  Result Date: 07/15/2022 CLINICAL DATA:  Acute abdominal pain EXAM: CT ABDOMEN AND PELVIS WITH CONTRAST TECHNIQUE: Multidetector CT imaging of the abdomen and pelvis was performed using the standard protocol following bolus administration of intravenous contrast. RADIATION DOSE REDUCTION: This exam was performed according to the departmental dose-optimization program which includes automated exposure control, adjustment of the mA and/or kV according to patient size and/or use of iterative reconstruction technique. CONTRAST:  190mL OMNIPAQUE IOHEXOL 300 MG/ML  SOLN COMPARISON:  Chest x-ray same day.  CT chest 04/04/2020. FINDINGS: Lower chest: Consolidated lung is seen throughout the lower left hemithorax with volume loss. Hepatobiliary: No focal liver abnormality is seen. No gallstones, gallbladder wall thickening, or biliary dilatation. Pancreas: Unremarkable. No pancreatic ductal dilatation or surrounding inflammatory changes. Spleen: Normal in size without focal abnormality. Adrenals/Urinary Tract: Patchy ill-defined rounded heterogeneous areas of hypodensity are seen in the left mid kidney. The largest area measures proximally 4 cm. There is mild surrounding inflammatory stranding. There is no hydronephrosis or perinephric fluid collection. Ill-defined patchy area of hypodensity in the right kidney is small in size. Adrenal glands and bladder are within normal limits. Stomach/Bowel: Stomach is within normal limits. Appendix is not seen. No evidence of bowel wall thickening, distention, or  inflammatory changes. There is a large amount of stool throughout the colon. Vascular/Lymphatic: Aortic atherosclerosis. No enlarged abdominal or pelvic lymph nodes. Reproductive: Prostate is unremarkable. Other: No abdominal wall hernia or abnormality. No abdominopelvic ascites. Musculoskeletal: No acute or significant osseous findings. IMPRESSION: 1. Patchy ill-defined areas of hypodensity in the left kidney with mild surrounding inflammatory stranding. Findings are concerning for pyelonephritis. Early renal abscesses are not excluded. 2. Patchy area of hypodensity in the right kidney is small in size and may represent focal pyelonephritis. 3. Consolidated lung in the lower left hemithorax with volume loss. Findings may represent atelectasis or infection. Underlying malignancy not excluded 4. Large amount of stool throughout the colon. Aortic Atherosclerosis (ICD10-I70.0). Electronically Signed   By: Ronney Asters M.D.   On: 07/15/2022 18:35   DG Chest 2 View  Result Date: 07/15/2022 CLINICAL DATA:  Weakness and cough EXAM: CHEST - 2 VIEW COMPARISON:  Chest CT dated March 26, 2020 FINDINGS: Cardiac and mediastinal contours are not visualized. New complete opacification of the left hemithorax. Leftward deviation of the trachea suggests findings are in part due to atelectasis and less likely due to large pleural effusion. Right lung is clear. Old left-sided rib fractures. IMPRESSION: New complete opacification of the left hemithorax. Leftward  deviation of the trachea suggests findings are in part due to atelectasis and less likely due to large pleural effusion. Recommend contrast-enhanced chest CT to assess for underlying malignancy. Electronically Signed   By: Yetta Glassman M.D.   On: 07/15/2022 18:09     Data Reviewed: Relevant notes from primary care and specialist visits, past discharge summaries as available in EHR, including Care Everywhere. Prior diagnostic testing as pertinent to current  admission diagnoses Updated medications and problem lists for reconciliation ED course, including vitals, labs, imaging, treatment and response to treatment Triage notes, nursing and pharmacy notes and ED provider's notes Notable results as noted in HPI   Assessment and Plan: Sepsis ( [place]) Septic embolism Patient with multiple foci of infection versus metastasis including lungs, kidneys, pericardium Sepsis criteria include tachycardia, hypotension, leukocytosis of 27,000 Suspecting primary source to be pulmonary Sepsis fluids Continue cefepime, vancomycin and azithromycin Follow blood cultures Will order transthoracic echocardiogram Consider ID consult  Cavitating mass of lung/ lung abscess Collapse of left lung Patient is not showing signs of respiratory distress except for tachycardia Etiology either infectious or related to malignancy Continue antibiotics of cefepime vancomycin and azithromycin Consult pulmonology in the a.m. Aspiration precautions Speech therapy evaluation Supplemental oxygen if needed Antitussives, albuterol as needed and supportive care  Acute pyelonephritis CT showed findings concerning for pyelonephritis. Early renal abscesses are not excluded. Continue aforementioned antibiotics Follow-up urinalysis ID consult  Pericarditis CT shows thickening and hyperenhancement of the visceral and parietal pericardium with central low attenuation fluid compatible with pericarditis.Marland KitchenMarland KitchenDifferential consideration includes septic emboli or metastases. Echocardiogram ordered Can consider cardiology consult versus await oncology for best area to biopsy Will get baseline EKG  Esophageal abnormality CT showed question circumferential lower esophageal wall thickening.Underlying neoplasm is difficult to exclude Consider GI evaluation either concurrent or following pulmonary assessment   Anemia Suspect related to underlying malignancy Patient had Venofer 200 mg x 1  dose in November for incidental finding of anemia on routine blood work Hemoglobin 10.1 down from 12.17 April 2022 Continue to trend hemoglobin and consider Venofer infusion Oncology consult  Hyponatremia Sodium 130.  Suspect malignancy Oncology consult in the a.m.  Underweight Patient had a drop in BMI from 22.65 a couple months prior to 18.99 based on chart review High suspicion for malignancy Oncology to be consulted  Auditory hallucinations Schizophrenia, undifferentiated Patient appears stable at this time Behavioral health consult in the a.m.  Chronic obstructive lung disease ([place]) Continue home inhaler DuoNebs as needed  Hypertension Hypotension Patient was previously on HCTZ Systolic in the 41L so hold off any antihypertensives and continue to monitor         DVT prophylaxis: Lovenox  Consults: none  Advance Care Planning:   Code Status: Prior   Family Communication: none  Disposition Plan: Back to previous home environment  Severity of Illness: The appropriate patient status for this patient is INPATIENT. Inpatient status is judged to be reasonable and necessary in order to provide the required intensity of service to ensure the patient's safety. The patient's presenting symptoms, physical exam findings, and initial radiographic and laboratory data in the context of their chronic comorbidities is felt to place them at high risk for further clinical deterioration. Furthermore, it is not anticipated that the patient will be medically stable for discharge from the hospital within 2 midnights of admission.   * I certify that at the point of admission it is my clinical judgment that the patient will require inpatient hospital care spanning beyond 2  midnights from the point of admission due to high intensity of service, high risk for further deterioration and high frequency of surveillance required.*  Author: Athena Masse, MD 07/15/2022 9:01 PM  For on call  review [email or web address].

## 2022-07-15 NOTE — Assessment & Plan Note (Addendum)
Patient had a drop in BMI from 22.65 a couple months prior to 18.99 based on chart review High suspicion for malignancy

## 2022-07-15 NOTE — ED Provider Notes (Signed)
[facility] Provider Note    Event Date/Time   First MD Initiated Contact with Patient 07/15/22 1730     (approximate)   History   Hallucinations   HPI  Kenneth [name]. is a 62 y.o. male history of schizophrenia and noncompliance COPD presents to the ER for evaluation of hearing voices having hallucinations and feeling like he needs more medications.  Denies any SI or HI.  No nausea or vomiting no chest pain or shortness of breath.  States he gets monthly shots.  He denies any nausea or vomiting does have some mild abdominal pain and stomach upset.  Is not really sure when the symptoms started.     Physical Exam   Triage Vital Signs: ED Triage Vitals  Enc Vitals Group     BP 07/15/22 1520 91/62     Pulse Rate 07/15/22 1520 (!) 122     Resp 07/15/22 1520 18     Temp 07/15/22 1520 98 F (36.7 C)     Temp src --      SpO2 07/15/22 1520 94 %     Weight --      Height --      Head Circumference --      Peak Flow --      Pain Score 07/15/22 1518 0     Pain Loc --      Pain Edu? --      Excl. in [place]? --     Most recent vital signs: Vitals:   07/15/22 1520 07/15/22 1912  BP: 91/62 98/61  Pulse: (!) 122 100  Resp: 18 20  Temp: 98 F (36.7 C) 98 F (36.7 C)  SpO2: 94% 96%     Constitutional: Alert  Eyes: Conjunctivae are normal.  Head: Atraumatic. Nose: No congestion/rhinnorhea. Mouth/Throat: Mucous membranes are moist.   Neck: Painless ROM.  Cardiovascular:   Good peripheral circulation. Respiratory: mild tachypnea, absent bs on the left Gastrointestinal: Soft  Musculoskeletal:  no deformity Neurologic:  MAE spontaneously. No gross focal neurologic deficits are appreciated.  Skin:  Skin is warm, dry and intact. No rash noted. Psychiatric: calm and cooperative    ED Results / Procedures / Treatments   Labs (all labs ordered are listed, but only abnormal results are displayed) Labs Reviewed  COMPREHENSIVE METABOLIC PANEL -  Abnormal; Notable for the following components:      Result Value   Sodium 130 (*)    Chloride 94 (*)    Glucose, Bld 123 (*)    Albumin 2.1 (*)    All other components within normal limits  SALICYLATE LEVEL - Abnormal; Notable for the following components:   Salicylate Lvl <8.3 (*)    All other components within normal limits  ACETAMINOPHEN LEVEL - Abnormal; Notable for the following components:   Acetaminophen (Tylenol), Serum <10 (*)    All other components within normal limits  CBC - Abnormal; Notable for the following components:   WBC 27.4 (*)    RBC 3.89 (*)    Hemoglobin 10.1 (*)    HCT 31.3 (*)    RDW 15.6 (*)    Platelets 610 (*)    All other components within normal limits  URINE DRUG SCREEN, QUALITATIVE ([facility] ONLY) - Abnormal; Notable for the following components:   Cannabinoid 50 Ng, Ur  POSITIVE (*)    All other components within normal limits  RESP PANEL BY RT-PCR (RSV, FLU A&B, COVID)  RVPGX2  CULTURE, BLOOD (ROUTINE  X 2)  CULTURE, BLOOD (ROUTINE X 2)  ETHANOL  LACTIC ACID, PLASMA  URINALYSIS, ROUTINE W REFLEX MICROSCOPIC  LACTIC ACID, PLASMA     EKG   RADIOLOGY Please see ED Course for my review and interpretation.  I personally reviewed all radiographic images ordered to evaluate for the above acute complaints and reviewed radiology reports and findings.  These findings were personally discussed with the patient.  Please see medical record for radiology report.    PROCEDURES:  Critical Care performed: No  Procedures   MEDICATIONS ORDERED IN ED: Medications  vancomycin (VANCOCIN) IVPB 1000 mg/200 mL premix (has no administration in time range)  ceFEPIme (MAXIPIME) 2 g in sodium chloride 0.9 % 100 mL IVPB (has no administration in time range)  azithromycin (ZITHROMAX) 500 mg in sodium chloride 0.9 % 250 mL IVPB (has no administration in time range)  sodium chloride 0.9 % bolus 1,000 mL (1,000 mLs Intravenous New Bag/Given 07/15/22 1808)   iohexol (OMNIPAQUE) 300 MG/ML solution 100 mL (100 mLs Intravenous Contrast Given 07/15/22 1813)  iohexol (OMNIPAQUE) 300 MG/ML solution 60 mL (60 mLs Intravenous Contrast Given 07/15/22 1843)     IMPRESSION / MDM / ASSESSMENT AND PLAN / ED COURSE  I reviewed the triage vital signs and the nursing notes.                              Differential diagnosis includes, but is not limited to, Asthma, copd, CHF, pna, ptx, malignancy, Pe, anemia  Patient presenting to the ER for evaluation of symptoms as described above.  Based on symptoms, risk factors and considered above differential, this presenting complaint could reflect a potentially life-threatening illness therefore the patient will be placed on continuous pulse oximetry and telemetry for monitoring.  Laboratory evaluation will be sent to evaluate for the above complaints.      Clinical Course as of 07/15/22 2001  Wed Jul 15, 2022  1831 Chest x-ray shows evidence of opacification of the left hemothorax.  Patient without any significant complaints however.  Had already gone for CT abdomen.  Will send back for CT chest to further evaluate per radiology report. [PR]  1900 CT image chest on my review shows dense consolidation near total collapse of the left hemithorax finding concerning for abscess of the left lung.  Will await formal radiology report but based on these findings have ordered empiric antibiotics. [PR]  1950 Will consult hospitalist for admission. [PR]    Clinical Course User Index [PR] Merlyn Lot, MD     FINAL CLINICAL IMPRESSION(S) / ED DIAGNOSES   Final diagnoses:  Pneumonia of left lung due to infectious organism, unspecified part of lung  Pyelonephritis     Rx / DC Orders   ED Discharge Orders     None        Note:  This document was prepared using Dragon voice recognition software and may include unintentional dictation errors.    Merlyn Lot, MD 07/15/22 2001

## 2022-07-15 NOTE — ED Notes (Signed)
Iv infiltrated in right arm.  Iv d'ced.  Pt tolerated well.

## 2022-07-15 NOTE — Assessment & Plan Note (Addendum)
Stable.  Continue home inhaler DuoNebs as needed

## 2022-07-15 NOTE — Assessment & Plan Note (Signed)
CT showed findings concerning for pyelonephritis. Early renal abscesses are not excluded. Continue aforementioned antibiotics Follow-up urinalysis ID consult

## 2022-07-15 NOTE — Consult Note (Signed)
Pharmacy Antibiotic Note  Kenneth [name]. is a 62 y.o. male admitted on 07/15/2022 with pneumonia.  Pharmacy has been consulted for Cefepime and Vancomycin dosing.  Plan: Cefepime 2gm IVPB q 8hrs Vancomycin 1000mg  given at 2033, Will order 500mg  to compete loading dose,  then Vancomycin 750 mg IV Q 12 hrs.  Goal AUC 400-550. Expected AUC: 422  TBW < IBW SCr used: 0.80   Height: 6' (182.9 cm) Weight: 63.5 kg (140 lb) IBW/kg (Calculated) : 77.6  Temp (24hrs), Avg:98 F (36.7 C), Min:98 F (36.7 C), Max:98 F (36.7 C)  Recent Labs  Lab 07/15/22 1519 07/15/22 1915 07/15/22 2050  WBC 27.4*  --   --   CREATININE 0.77  --   --   LATICACIDVEN  --  1.3 1.7    Estimated Creatinine Clearance: 88.2 mL/min (by C-G formula based on SCr of 0.77 mg/dL).    No Known Allergies  Antimicrobials this admission: 1/31 Cefepime >>  1/31 Vancomycin >>   Dose adjustments this admission: none    Thank you for allowing pharmacy to be a part of this patient's care.  Guyla Bless Rodriguez-Guzman PharmD, BCPS 07/15/2022 9:42 PM

## 2022-07-15 NOTE — Assessment & Plan Note (Addendum)
Hypotension Patient was previously on HCTZ - held. Systolic in the 82N so hold off any antihypertensives and continue to monitor

## 2022-07-15 NOTE — Assessment & Plan Note (Addendum)
Septic embolism Patient with multiple foci of infection versus metastasis including lungs, kidneys, pericardium Sepsis criteria include tachycardia, hypotension, leukocytosis of 27,000 Primary source appears is pulmonary (pneumonia) Received Sepsis protocol fluids and IV broad spectrum antibiotics. --Follow blood cultures --ID following

## 2022-07-16 ENCOUNTER — Encounter: Payer: Self-pay | Admitting: Oncology

## 2022-07-16 ENCOUNTER — Encounter: Payer: Self-pay | Admitting: Internal Medicine

## 2022-07-16 ENCOUNTER — Inpatient Hospital Stay (HOSPITAL_COMMUNITY)
Admit: 2022-07-16 | Discharge: 2022-07-16 | Disposition: A | Discharge status: Home / Self Care | Payer: Medicare HMO | Attending: Internal Medicine | Admitting: Internal Medicine

## 2022-07-16 DIAGNOSIS — J189 Pneumonia, unspecified organism: Secondary | ICD-10-CM

## 2022-07-16 DIAGNOSIS — F17210 Nicotine dependence, cigarettes, uncomplicated: Secondary | ICD-10-CM | POA: Diagnosis not present

## 2022-07-16 DIAGNOSIS — F203 Undifferentiated schizophrenia: Secondary | ICD-10-CM | POA: Diagnosis not present

## 2022-07-16 DIAGNOSIS — K229 Disease of esophagus, unspecified: Secondary | ICD-10-CM | POA: Diagnosis not present

## 2022-07-16 DIAGNOSIS — R440 Auditory hallucinations: Secondary | ICD-10-CM

## 2022-07-16 DIAGNOSIS — R933 Abnormal findings on diagnostic imaging of other parts of digestive tract: Secondary | ICD-10-CM | POA: Diagnosis not present

## 2022-07-16 DIAGNOSIS — D649 Anemia, unspecified: Secondary | ICD-10-CM

## 2022-07-16 DIAGNOSIS — I3139 Other pericardial effusion (noninflammatory): Secondary | ICD-10-CM

## 2022-07-16 LAB — HIV ANTIBODY (ROUTINE TESTING W REFLEX): HIV Screen 4th Generation wRfx: NONREACTIVE

## 2022-07-16 LAB — CBC
HCT: 28 % — ABNORMAL LOW (ref 39.0–52.0)
Hemoglobin: 8.9 g/dL — ABNORMAL LOW (ref 13.0–17.0)
MCH: 26 pg (ref 26.0–34.0)
MCHC: 31.8 g/dL (ref 30.0–36.0)
MCV: 81.9 fL (ref 80.0–100.0)
Platelets: 544 10*3/uL — ABNORMAL HIGH (ref 150–400)
RBC: 3.42 MIL/uL — ABNORMAL LOW (ref 4.22–5.81)
RDW: 15.7 % — ABNORMAL HIGH (ref 11.5–15.5)
WBC: 15.4 10*3/uL — ABNORMAL HIGH (ref 4.0–10.5)
nRBC: 0 % (ref 0.0–0.2)

## 2022-07-16 LAB — ECHOCARDIOGRAM COMPLETE
AR max vel: 3.16 cm2
AV Area VTI: 2.83 cm2
AV Area mean vel: 3.03 cm2
AV Mean grad: 6 mmHg
AV Peak grad: 10.2 mmHg
Ao pk vel: 1.6 m/s
Area-P 1/2: 4.8 cm2
Height: 72 in
S' Lateral: 2.6 cm
Weight: 2240 oz

## 2022-07-16 LAB — COMPREHENSIVE METABOLIC PANEL
ALT: 36 U/L (ref 0–44)
AST: 23 U/L (ref 15–41)
Albumin: 1.8 g/dL — ABNORMAL LOW (ref 3.5–5.0)
Alkaline Phosphatase: 94 U/L (ref 38–126)
Anion gap: 4 — ABNORMAL LOW (ref 5–15)
BUN: 14 mg/dL (ref 6–20)
CO2: 28 mmol/L (ref 22–32)
Calcium: 8.9 mg/dL (ref 8.9–10.3)
Chloride: 104 mmol/L (ref 98–111)
Creatinine, Ser: 0.9 mg/dL (ref 0.61–1.24)
GFR, Estimated: >60 mL/min (ref >60)
Glucose, Bld: 124 mg/dL — ABNORMAL HIGH (ref 70–99)
Potassium: 3.7 mmol/L (ref 3.5–5.1)
Sodium: 136 mmol/L (ref 135–145)
Total Bilirubin: 0.5 mg/dL (ref 0.3–1.2)
Total Protein: 6.1 g/dL — ABNORMAL LOW (ref 6.5–8.1)

## 2022-07-16 LAB — PROTIME-INR
INR: 1.3 — ABNORMAL HIGH (ref 0.8–1.2)
Prothrombin Time: 15.7 seconds — ABNORMAL HIGH (ref 11.4–15.2)

## 2022-07-16 LAB — CORTISOL-AM, BLOOD: Cortisol - AM: 17.1 ug/dL (ref 6.7–22.6)

## 2022-07-16 LAB — MRSA NEXT GEN BY PCR, NASAL: MRSA by PCR Next Gen: NOT DETECTED

## 2022-07-16 LAB — PROCALCITONIN: Procalcitonin: 0.12 ng/mL

## 2022-07-16 MED ORDER — PALIPERIDONE ER 3 MG PO TB24
3.0000 mg | ORAL_TABLET | Freq: Every day | ORAL | Status: DC
  Administered 2022-07-16 – 2022-07-27 (×12): 3 mg via ORAL
  Filled 2022-07-16 (×12): qty 1

## 2022-07-16 MED ORDER — SODIUM CHLORIDE 0.9 % IV SOLN
3.0000 g | Freq: Four times a day (QID) | INTRAVENOUS | Status: DC
  Administered 2022-07-16 – 2022-07-20 (×16): 3 g via INTRAVENOUS
  Filled 2022-07-16 (×3): qty 8
  Filled 2022-07-16: qty 3
  Filled 2022-07-16 (×9): qty 8
  Filled 2022-07-16: qty 3
  Filled 2022-07-16 (×4): qty 8

## 2022-07-16 MED ORDER — ENOXAPARIN SODIUM 40 MG/0.4ML IJ SOSY
40.0000 mg | PREFILLED_SYRINGE | INTRAMUSCULAR | Status: DC
  Administered 2022-07-16 – 2022-07-26 (×11): 40 mg via SUBCUTANEOUS
  Filled 2022-07-16 (×12): qty 0.4

## 2022-07-16 NOTE — Consult Note (Signed)
[facility] Face-to-Face Psychiatry Consult   Reason for Consult:  Schizophrenia and Auditory Hallucinations. Referring Physician:  Dr. Mal Misty Patient Identification: Kenneth [name]. MRN:  [id number] Principal Diagnosis: Schizophrenia, undifferentiated ([place]) Diagnosis:  Principal Problem:   Schizophrenia, undifferentiated ([place]) Active Problems:   Auditory hallucinations   Tobacco use disorder   Paranoid schizophrenia ([place])   Bipolar 1 disorder ([place])   Hypertension   Pneumonia of left lung due to infectious organism   Sepsis ([place])   Chronic obstructive lung disease ([place])   Cavitating mass of lung/ lung abscess   Anemia   Esophageal abnormality   Septic embolism ([name])   Underweight   Collapse of left lung   Pericarditis   Total Time spent with patient: 45 minutes  Subjective: " I felt like I was out of it." Kenneth [name]. is a 62 y.o. male seen the request of the medical team for paranoid schizophrenia and auditory hallucinations.  HPI:  Patient reports a history of hearing voices, last episode "in my 102s". The patient denies recent hallucinations during hospitalization. The patient reports he is currently on a monthly injection for schizophrenia, and is due for the next dose on February 18. The patient reports previously being on Depakote, for mood stabilization, for years but was taken off of it for unknown reasons. The patient expresses a desire to restart this medication, stating that it puts him in a better mood and makes him feel more tranquil.The patient denies current feelings of depression but admits to taking medication for anxiety in the past. The patient also denies having any sleep issues. The patient reports he is disabled and currently resides in a group home in [place].   On evaluation, patient is alert and oriented x 3, he appears to be disoriented to situation. Despite this he is calm, cooperative, and mood-congruent with affect. The patient does not appear to be  responding to internal or external stimuli. Nor is the patient presenting with any delusional thinking. Writer spoke with assigned RN. RN reports patient exhibits forgetfulness and appears to have no insight to situation. RN reports no evidence of auditory or visual hallucinations noted at present.   Collateral obtained from [facility] from [place]: Ms.Taylor reports the patient is married memory has been more confused lately, he has been losing weight.  She reports he underwent iron infusions last month due to long iron levels, and since then, there has been noticeable weight loss.  Patient's eating habits have also changed; he has become picky about his food and is not eating as well as he used to.  However, he will eat food that he finds appealing.  Ms. Lovena Le reports the patient has been hearing voices and responding to internal stimuli. Ms. Lovena Le reports, previously, the patient was receiving an injection every month but the doctor recently changed his injection to every 3 months with the first of these 49-month injections was due on the eighth of this month.  The injection is [name], at a dosage of 2.625 mg every 3 months. Ms. Lovena Le reports the patient has been at the facility a little over a year, however, he is being relocated to another facility on February 2 due to the facility closing. Ms. Lovena Le reports a cousin who is possibly in the process of becoming the patient's legal guardian.  Patient does not meet criteria for psychiatric inpatient admission. Collateral obtained from the [facility]. Patient's monthly Invega Sustenna injection recently changed to [place] every 3 months.  His next dose is due on Feb. 08. It appears the [place] is wearing off. Will add a low dose of oral Invega 3 mg daily.        Reviewed with Dr. Mal Misty.   Past Psychiatric History: The patient has a charted history of bipolar 1 disorder and paranoid schizophrenia.  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  Risk to Self:  No Risk to Others:  No Prior Inpatient Therapy:  Yes  Prior Outpatient Therapy:  Yes  Past Medical History:  Past Medical History:  Diagnosis Date   Bipolar 1 disorder ([place])    Hypertension    Memory loss    Paranoid schizophrenia ([place])    No past surgical history on file. Family History:  Family History  Problem Relation Age of Onset   Diabetes Mother    Cancer Mother    Cancer Maternal Grandfather    Family Psychiatric  History: History reviewed.  No pertinent family psychiatric history. Social History:  Social History   Substance and Sexual Activity  Alcohol Use Not Currently     Social History   Substance and Sexual Activity  Drug Use Never    Social History   Socioeconomic History   Marital status: Single    Spouse name: Not on file   Number of children: Not on file   Years of education: Not on file   Highest education level: Not on file  Occupational History   Not on file  Tobacco Use   Smoking status: Every Day    Types: Cigarettes   Smokeless tobacco: Never  Substance and Sexual Activity   Alcohol use: Not Currently   Drug use: Never   Sexual activity: Not on file  Other Topics Concern   Not on file  Social History Narrative   Not on file   Social Determinants of Health   Financial Resource Strain: Not on file  Food Insecurity: No Food Insecurity (07/16/2022)   Hunger Vital Sign    Worried About Running Out of Food in the Last Year: Never true    Ran Out of Food in the Last Year: Never true  Transportation Needs: No Transportation Needs (07/16/2022)   PRAPARE - Hydrologist (Medical): No    Lack of Transportation (Non-Medical): No  Physical Activity: Not on file  Stress: Not on file  Social Connections: Not on file   Additional Social History:    Allergies:  No  Known Allergies  Labs:  Results for orders placed or performed during the hospital encounter of 07/15/22 (from the past 48 hour(s))  Comprehensive metabolic panel     Status: Abnormal   Collection Time: 07/15/22  3:19 PM  Result Value Ref Range   Sodium 130 (L) 135 - 145 mmol/L   Potassium 3.8 3.5 - 5.1 mmol/L   Chloride 94 (L) 98 - 111 mmol/L   CO2 28 22 - 32 mmol/L   Glucose, Bld 123 (H) 70 - 99 mg/dL    Comment: Glucose reference range applies only to samples taken after fasting for at least 8 hours.   BUN 11 6 - 20 mg/dL   Creatinine, Ser 0.77 0.61 - 1.24 mg/dL   Calcium 9.6 8.9 - 10.3 mg/dL   Total Protein 7.2 6.5 - 8.1 g/dL   Albumin 2.1 (L) 3.5 - 5.0 g/dL  AST 24 15 - 41 U/L   ALT 44 0 - 44 U/L   Alkaline Phosphatase 110 38 - 126 U/L   Total Bilirubin 0.7 0.3 - 1.2 mg/dL   GFR, Estimated >60 >60 mL/min    Comment: (NOTE) Calculated using the CKD-EPI Creatinine Equation (2021)    Anion gap 8 5 - 15    Comment: Performed at [facility], [address] [place], [place] [address]  Ethanol     Status: None   Collection Time: 07/15/22  3:19 PM  Result Value Ref Range   Alcohol, Ethyl (B) <10 <10 mg/dL    Comment: (NOTE) Lowest detectable limit for serum alcohol is 10 mg/dL.  For medical purposes only. Performed at [facility], [address] [place], [place] [address]   Salicylate level     Status: Abnormal   Collection Time: 07/15/22  3:19 PM  Result Value Ref Range   Salicylate Lvl <9.3 (L) 7.0 - 30.0 mg/dL    Comment: Performed at [facility], [address] [place], [place] [address]  Acetaminophen level     Status: Abnormal   Collection Time: 07/15/22  3:19 PM  Result Value Ref Range   Acetaminophen (Tylenol), Serum <10 (L) 10 - 30 ug/mL    Comment: (NOTE) Therapeutic concentrations vary significantly. A range of 10-30 ug/mL  may be an effective concentration for many patients. However, some  are best treated at  concentrations outside of this range. Acetaminophen concentrations >150 ug/mL at 4 hours after ingestion  and >50 ug/mL at 12 hours after ingestion are often associated with  toxic reactions.  Performed at [facility], [address] [place], [place] [address]   cbc     Status: Abnormal   Collection Time: 07/15/22  3:19 PM  Result Value Ref Range   WBC 27.4 (H) 4.0 - 10.5 K/uL   RBC 3.89 (L) 4.22 - 5.81 MIL/uL   Hemoglobin 10.1 (L) 13.0 - 17.0 g/dL   HCT 31.3 (L) 39.0 - 52.0 %   MCV 80.5 80.0 - 100.0 fL   MCH 26.0 26.0 - 34.0 pg   MCHC 32.3 30.0 - 36.0 g/dL   RDW 15.6 (H) 11.5 - 15.5 %   Platelets 610 (H) 150 - 400 K/uL   nRBC 0.0 0.0 - 0.2 %    Comment: Performed at [facility], [address] [place],  [address]  Urine Drug Screen, Qualitative     Status: Abnormal   Collection Time: 07/15/22  6:05 PM  Result Value Ref Range   Tricyclic, Ur Screen NONE DETECTED NONE DETECTED   Amphetamines, Ur Screen NONE DETECTED NONE DETECTED   MDMA (Ecstasy)Ur Screen NONE DETECTED NONE DETECTED   Cocaine Metabolite,Ur Levasy NONE DETECTED NONE DETECTED   Opiate, Ur Screen NONE DETECTED NONE DETECTED   Phencyclidine (PCP) Ur S NONE DETECTED NONE DETECTED   Cannabinoid 50 Ng, Ur [place] POSITIVE (A) NONE DETECTED   Barbiturates, Ur Screen NONE DETECTED NONE DETECTED   Benzodiazepine, Ur Scrn NONE DETECTED NONE DETECTED   Methadone Scn, Ur NONE DETECTED NONE DETECTED    Comment: (NOTE) Tricyclics + metabolites, urine    Cutoff 1000 ng/mL Amphetamines + metabolites, urine  Cutoff 1000 ng/mL MDMA (Ecstasy), urine              Cutoff 500 ng/mL Cocaine Metabolite, urine          Cutoff 300 ng/mL Opiate + metabolites, urine  Cutoff 300 ng/mL Phencyclidine (PCP), urine         Cutoff 25 ng/mL Cannabinoid, urine                 Cutoff 50 ng/mL Barbiturates + metabolites, urine  Cutoff 200 ng/mL Benzodiazepine, urine              Cutoff 200 ng/mL Methadone, urine                    Cutoff 300 ng/mL  The urine drug screen provides only a preliminary, unconfirmed analytical test result and should not be used for non-medical purposes. Clinical consideration and professional judgment should be applied to any positive drug screen result due to possible interfering substances. A more specific alternate chemical method must be used in order to obtain a confirmed analytical result. Gas chromatography / mass spectrometry (GC/MS) is the preferred confirm atory method. Performed at [facility], [address] [place], [place] [address]   Resp panel by RT-PCR (RSV, Flu A&B, Covid) Anterior Nasal Swab     Status: None   Collection Time: 07/15/22  6:05 PM   Specimen: Anterior Nasal Swab  Result Value Ref Range   SARS Coronavirus 2 by RT PCR NEGATIVE NEGATIVE    Comment: (NOTE) SARS-CoV-2 target nucleic acids are NOT DETECTED.  The SARS-CoV-2 RNA is generally detectable in upper respiratory specimens during the acute phase of infection. The lowest concentration of SARS-CoV-2 viral copies this assay can detect is 138 copies/mL. A negative result does not preclude SARS-Cov-2 infection and should not be used as the sole basis for treatment or other patient management decisions. A negative result may occur with  improper specimen collection/handling, submission of specimen other than nasopharyngeal swab, presence of viral mutation(s) within the areas targeted by this assay, and inadequate number of viral copies(<138 copies/mL). A negative result must be combined with clinical observations, patient history, and epidemiological information. The expected result is Negative.  Fact Sheet for Patients:  EntrepreneurPulse.com.au  Fact Sheet for Healthcare Providers:  IncredibleEmployment.be  This test is no t yet approved or cleared by the [place] FDA and  has been authorized for detection and/or diagnosis of  SARS-CoV-2 by FDA under an Emergency Use Authorization (EUA). This EUA will remain  in effect (meaning this test can be used) for the duration of the COVID-19 declaration under Section 564(b)(1) of the Act, 21 U.S.C.section 360bbb-3(b)(1), unless the authorization is terminated  or revoked sooner.       Influenza A by PCR NEGATIVE NEGATIVE   Influenza B by PCR NEGATIVE NEGATIVE    Comment: (NOTE) The Xpert Xpress SARS-CoV-2/FLU/RSV plus assay is intended as an aid in the diagnosis of influenza from Nasopharyngeal swab specimens and should not be used as a sole basis for treatment. Nasal washings and aspirates are unacceptable for Xpert Xpress SARS-CoV-2/FLU/RSV testing.  Fact Sheet for Patients: EntrepreneurPulse.com.au  Fact Sheet for Healthcare Providers: IncredibleEmployment.be  This test is not yet approved or cleared by the [place] FDA and has been authorized for detection and/or diagnosis of SARS-CoV-2 by FDA under an Emergency Use Authorization (EUA). This EUA will remain in effect (meaning this test can be used) for the duration of the COVID-19 declaration under Section 564(b)(1) of the Act, 21 U.S.C. section 360bbb-3(b)(1), unless the authorization is terminated or revoked.     Resp Syncytial Virus by PCR NEGATIVE NEGATIVE    Comment: (NOTE) Fact Sheet for Patients: EntrepreneurPulse.com.au  Fact Sheet for Healthcare  Providers: IncredibleEmployment.be  This test is not yet approved or cleared by the [place] and has been authorized for detection and/or diagnosis of SARS-CoV-2 by FDA under an Emergency Use Authorization (EUA). This EUA will remain in effect (meaning this test can be used) for the duration of the COVID-19 declaration under Section 564(b)(1) of the Act, 21 U.S.C. section 360bbb-3(b)(1), unless the authorization is terminated or revoked.  Performed at [facility], [address] [place], [place] [address]   Urinalysis, Routine w reflex microscopic -Urine, Clean Catch     Status: Abnormal   Collection Time: 07/15/22  6:05 PM  Result Value Ref Range   Color, Urine YELLOW (A) YELLOW   APPearance CLEAR (A) CLEAR   Specific Gravity, Urine 1.009 1.005 - 1.030   pH 5.0 5.0 - 8.0   Glucose, UA NEGATIVE NEGATIVE mg/dL   Hgb urine dipstick NEGATIVE NEGATIVE   Bilirubin Urine NEGATIVE NEGATIVE   Ketones, ur NEGATIVE NEGATIVE mg/dL   Protein, ur NEGATIVE NEGATIVE mg/dL   Nitrite NEGATIVE NEGATIVE   Leukocytes,Ua NEGATIVE NEGATIVE    Comment: Performed at [facility], [address] [place], [place] [address]  Lactic acid, plasma     Status: None   Collection Time: 07/15/22  7:15 PM  Result Value Ref Range   Lactic Acid, Venous 1.3 0.5 - 1.9 mmol/L    Comment: Performed at [facility], [address] [place], [place] [address]  Blood Culture (routine x 2)     Status: None (Preliminary result)   Collection Time: 07/15/22  7:15 PM   Specimen: BLOOD  Result Value Ref Range   Specimen Description BLOOD LEFT AC    Special Requests      BOTTLES DRAWN AEROBIC AND ANAEROBIC Blood Culture adequate volume   Culture      NO GROWTH < 24 HOURS Performed at [facility], [address], [place], [place] [address]    Report Status PENDING   Blood Culture (routine x 2)     Status: None (Preliminary result)   Collection Time: 07/15/22  7:15 PM   Specimen: BLOOD  Result Value Ref Range   Specimen Description BLOOD RIGHT AC    Special Requests      BOTTLES DRAWN AEROBIC AND ANAEROBIC Blood Culture adequate volume   Culture      NO GROWTH < 24 HOURS Performed at [facility], [address] [place], [place] [address]    Report Status PENDING   Lactic acid, plasma     Status: None   Collection Time: 07/15/22  8:50 PM  Result Value Ref Range   Lactic Acid, Venous 1.7 0.5 - 1.9 mmol/L    Comment:  Performed at [facility], [address] [place], [place] [address]  Protime-INR     Status: Abnormal   Collection Time: 07/16/22  3:31 AM  Result Value Ref Range   Prothrombin Time 15.7 (H) 11.4 - 15.2 seconds   INR 1.3 (H) 0.8 - 1.2    Comment: (NOTE) INR goal varies based on device and disease states. Performed at [facility], [address] [place], [place] [address]   Cortisol-am, blood     Status: None   Collection Time: 07/16/22  3:31 AM  Result Value Ref Range   Cortisol - AM 17.1 6.7 - 22.6 ug/dL    Comment: Performed at [facility], [place] [address], [place], [place] [address]  Procalcitonin  Status: None   Collection Time: 07/16/22  3:31 AM  Result Value Ref Range   Procalcitonin 0.12 ng/mL    Comment:        Interpretation: PCT (Procalcitonin) <= 0.5 ng/mL: Systemic infection (sepsis) is not likely. Local bacterial infection is possible. (NOTE)       Sepsis PCT Algorithm           Lower Respiratory Tract                                      Infection PCT Algorithm    ----------------------------     ----------------------------         PCT < 0.25 ng/mL                PCT < 0.10 ng/mL          Strongly encourage             Strongly discourage   discontinuation of antibiotics    initiation of antibiotics    ----------------------------     -----------------------------       PCT 0.25 - 0.50 ng/mL            PCT 0.10 - 0.25 ng/mL               OR       >80% decrease in PCT            Discourage initiation of                                            antibiotics      Encourage discontinuation           of antibiotics    ----------------------------     -----------------------------         PCT >= 0.50 ng/mL              PCT 0.26 - 0.50 ng/mL               AND        <80% decrease in PCT             Encourage initiation of                                             antibiotics       Encourage continuation           of  antibiotics    ----------------------------     -----------------------------        PCT >= 0.50 ng/mL                  PCT > 0.50 ng/mL               AND         increase in PCT                  Strongly encourage                                      initiation of antibiotics  Strongly encourage escalation           of antibiotics                                     -----------------------------                                           PCT <= 0.25 ng/mL                                                 OR                                        > 80% decrease in PCT                                      Discontinue / Do not initiate                                             antibiotics  Performed at [facility], [address] [place], [place] [address]   Comprehensive metabolic panel     Status: Abnormal   Collection Time: 07/16/22  3:31 AM  Result Value Ref Range   Sodium 136 135 - 145 mmol/L   Potassium 3.7 3.5 - 5.1 mmol/L   Chloride 104 98 - 111 mmol/L   CO2 28 22 - 32 mmol/L   Glucose, Bld 124 (H) 70 - 99 mg/dL    Comment: Glucose reference range applies only to samples taken after fasting for at least 8 hours.   BUN 14 6 - 20 mg/dL   Creatinine, Ser 0.90 0.61 - 1.24 mg/dL   Calcium 8.9 8.9 - 10.3 mg/dL   Total Protein 6.1 (L) 6.5 - 8.1 g/dL   Albumin 1.8 (L) 3.5 - 5.0 g/dL   AST 23 15 - 41 U/L   ALT 36 0 - 44 U/L   Alkaline Phosphatase 94 38 - 126 U/L   Total Bilirubin 0.5 0.3 - 1.2 mg/dL   GFR, Estimated >60 >60 mL/min    Comment: (NOTE) Calculated using the CKD-EPI Creatinine Equation (2021)    Anion gap 4 (L) 5 - 15    Comment: Performed at [facility], [address] [place], [place] [address]  CBC     Status: Abnormal   Collection Time: 07/16/22  3:31 AM  Result Value Ref Range   WBC 15.4 (H) 4.0 - 10.5 K/uL   RBC 3.42 (L) 4.22 - 5.81 MIL/uL   Hemoglobin 8.9 (L) 13.0 - 17.0 g/dL   HCT 28.0 (L) 39.0 - 52.0 %   MCV 81.9 80.0 -  100.0 fL   MCH 26.0 26.0 - 34.0 pg   MCHC 31.8 30.0 - 36.0 g/dL   RDW 15.7 (H) 11.5 - 15.5 %   Platelets 544 (H) 150 - 400 K/uL  nRBC 0.0 0.0 - 0.2 %    Comment: Performed at [facility], [address] [place], [place] [address]  HIV Antibody (routine testing w rflx)     Status: None   Collection Time: 07/16/22  3:31 AM  Result Value Ref Range   HIV Screen 4th Generation wRfx Non Reactive Non Reactive    Comment: Performed at [facility], [place] [address], [place], [place] [address]  MRSA Next Gen by PCR, Nasal     Status: None   Collection Time: 07/16/22  9:50 AM   Specimen: Nasal Mucosa; Nasal Swab  Result Value Ref Range   MRSA by PCR Next Gen NOT DETECTED NOT DETECTED    Comment: (NOTE) The GeneXpert MRSA Assay (FDA approved for NASAL specimens only), is one component of a comprehensive MRSA colonization surveillance program. It is not intended to diagnose MRSA infection nor to guide or monitor treatment for MRSA infections. Test performance is not FDA approved in patients less than 43 years old. Performed at [facility], [address], [place], [place] [address]     Current Facility-Administered Medications  Medication Dose Route Frequency Provider Last Rate Last Admin   acetaminophen (TYLENOL) tablet 650 mg  650 mg Oral Q6H PRN Athena Masse, MD       Or   acetaminophen (TYLENOL) suppository 650 mg  650 mg Rectal Q6H PRN Athena Masse, MD       albuterol (PROVENTIL) (2.5 MG/3ML) 0.083% nebulizer solution 2.5 mg  2.5 mg Nebulization Q6H PRN Athena Masse, MD       Ampicillin-Sulbactam (UNASYN) 3 g in sodium chloride 0.9 % 100 mL IVPB  3 g Intravenous Q6H Mignon Pine, DO 200 mL/hr at 07/16/22 1311 3 g at 07/16/22 1311   atorvastatin (LIPITOR) tablet 20 mg  20 mg Oral Daily Judd Gaudier V, MD   20 mg at 07/16/22 0827   enoxaparin (LOVENOX) injection 40 mg  40 mg Subcutaneous Q24H Jennye Boroughs, MD       fluticasone  furoate-vilanterol (BREO ELLIPTA) 200-25 MCG/ACT 1 puff  1 puff Inhalation Daily Athena Masse, MD   1 puff at 07/16/22 7893   And   umeclidinium bromide (INCRUSE ELLIPTA) 62.5 MCG/ACT 1 puff  1 puff Inhalation Daily Athena Masse, MD   1 puff at 07/16/22 0826   HYDROcodone-acetaminophen (NORCO/VICODIN) 5-325 MG per tablet 1-2 tablet  1-2 tablet Oral Q4H PRN Athena Masse, MD   1 tablet at 07/15/22 2303   hydrOXYzine (ATARAX) tablet 25 mg  25 mg Oral TID Athena Masse, MD   25 mg at 07/16/22 0828   montelukast (SINGULAIR) tablet 10 mg  10 mg Oral Daily Judd Gaudier V, MD   10 mg at 07/16/22 0828   ondansetron (ZOFRAN) tablet 4 mg  4 mg Oral Q6H PRN Athena Masse, MD       Or   ondansetron [facility]) injection 4 mg  4 mg Intravenous Q6H PRN Athena Masse, MD       oxybutynin (DITROPAN-XL) 24 hr tablet 10 mg  10 mg Oral Daily Judd Gaudier V, MD   10 mg at 07/16/22 0920   paliperidone (INVEGA) 24 hr tablet 3 mg  3 mg Oral Daily Cristobal Advani H, NP       PARoxetine (PAXIL) tablet 20 mg  20 mg Oral Daily Judd Gaudier V, MD   20 mg at 07/16/22 0920   vancomycin (VANCOREADY) IVPB 750 mg/150  mL  750 mg Intravenous Q12H Athena Masse, MD   Stopped at 07/16/22 1237    Musculoskeletal: Strength & Muscle Tone: within normal limits Gait & Station:  Did not observe  Patient leans: N/A            Psychiatric Specialty Exam:  Presentation  General Appearance: Appropriate for Environment  Eye Contact:Good  Speech:Clear and Coherent  Speech Volume:Normal  Handedness:Right   Mood and Affect  Mood:-- (WNL)  Affect:Congruent; Appropriate   Thought Process  Thought Processes:Coherent  Descriptions of Associations:Intact  Orientation:-- (Disoriented to situation)  Thought Content:Logical  History of Schizophrenia/Schizoaffective disorder: Yes Duration of Psychotic Symptoms:  Hallucinations:Hallucinations: -- (Patient denies)  Ideas of Reference:-- (Patient  denies)  Suicidal Thoughts:Suicidal Thoughts: No  Homicidal Thoughts:Homicidal Thoughts: No   Sensorium  Memory:Immediate Fair  Judgment:Good  Insight:Fair   Executive Functions  Concentration:Good  Attention Span:Good  [place]   Psychomotor Activity  Psychomotor Activity:Psychomotor Activity: Normal   Assets  Assets:Desire for Improvement; Financial Resources/Insurance; Housing; Social Support   Sleep  Sleep:Sleep: Good   Physical Exam: Physical Exam HENT:     Head: Normocephalic.     Nose: Nose normal.  Pulmonary:     Effort: Pulmonary effort is normal.  Musculoskeletal:        General: Normal range of motion.     Cervical back: Normal range of motion.  Skin:    General: Skin is dry.  Neurological:     Mental Status: He is alert and oriented to person, place, and time.     Comments: Disoriented to situation  Psychiatric:        Mood and Affect: Mood normal.        Behavior: Behavior normal.    Review of Systems  Constitutional:  Positive for weight loss.  HENT: Negative.    Respiratory: Negative.    Musculoskeletal: Negative.   Neurological:  Negative for weakness.  Psychiatric/Behavioral:  Positive for substance abuse. Negative for depression, hallucinations and suicidal ideas.    Blood pressure 110/67, pulse (!) 106, temperature 99.6 F (37.6 C), resp. rate 20, height 6' (1.829 m), weight 63.5 kg, SpO2 96 %. Body mass index is 18.99 kg/m.  Treatment Plan Summary: Coll[facility] obtained from the [facility]. Patient's monthly Invega Sustenna injection recently changed to [place] every 3 months. His next dose is due on Feb. 08. It appears the [place] is wearing off. Will add a low dose of oral Invega 3 mg daily. Will communicate with pharmacy to see if able to acquire [name] injection.   Disposition: No evidence of imminent risk to self or others at present.   Patient does not meet criteria  for psychiatric inpatient admission. Reviewed with  Dr. Mal Misty.   Ronny Flurry, NP 07/16/2022 2:44 PM

## 2022-07-16 NOTE — Progress Notes (Signed)
*  PRELIMINARY RESULTS* Echocardiogram 2D Echocardiogram has been performed.  Kenneth [name] 07/16/2022, 9:16 AM

## 2022-07-16 NOTE — H&P (Signed)
Kenneth [name] , MD [address] [place], [place], [place], [address] [id number], [place], [place], [place], [address] Phone: [phone]  Fax: [phone]  Consultation  Referring Provider:     Dr Mal Misty Primary Care Physician:  Pcp, No Primary Gastroenterologist:  None          Reason for Consultation:     Anemia   Date of Admission:  07/15/2022 Date of Consultation:  07/17/2022         HPI:   Kenneth Meneely. is a 62 y.o. male with a history of paranoid skin failure, hypertension iron deficiency anemia who follows with Dr. Grayland Ormond in the outpatient for IV iron presents to the emergency room with auditory hallucinations.  In the ED had a CT abdomen and pelvis with contrast that showed hypodensity in the left kidney with mild surrounding inflammatory stranding concerning for pyelonephritis consolidated lung in the left hemothorax with volume loss representing atelectasis or infection large amount of stool throughout the colon malignancy cannot be excluded, a CT scan of the chest was performed that showed masslike lesion about the left hilum indeterminate may represent pulmonary abscess versus necrotic mass left mid lung 3.8 cm cavitary lesion with air-fluid level also concerning for abscess or necrotic mass.  Thickening and antihypertensive management of the baseline prior to pericardium suggestive of pericarditis questionable circumferential lower esophageal wall thickening neoplasm is difficult to exclude paraesophageal lymphadenopathy.   Hemoglobin was 12.3 g months back presently 8.9 g with an MCV of 81 Presently being treated for sepsis septic embolism lung collapse lung abscess mass of lung pyelonephritis.  He says she feels short of breath while walking and laying flat , denies any difficulty swallowing .  Past Medical History:  Diagnosis Date   Bipolar 1 disorder ([place])    Hypertension    Memory loss    Paranoid schizophrenia ([place])     No past surgical history on  file.  Prior to Admission medications   Medication Sig Start Date End Date Taking? Authorizing Provider  atorvastatin (LIPITOR) 20 MG tablet Take 20 mg by mouth daily. 04/07/22  Yes [provider]  BREZTRI AEROSPHERE 160-9-4.8 MCG/ACT AERO Inhale 1 puff into the lungs daily. 03/31/22  Yes [provider]  hydrochlorothiazide (HYDRODIURIL) 12.5 MG tablet Take 12.5 mg by mouth daily. 07/13/22  Yes [provider]  hydrOXYzine (ATARAX) 25 MG tablet Take 25 mg by mouth 3 (three) times daily. 03/27/22  Yes [provider]  lisinopril (ZESTRIL) 5 MG tablet Take 5 mg by mouth daily. 03/27/22  Yes [provider]  montelukast (SINGULAIR) 10 MG tablet Take 10 mg by mouth daily. 04/09/22  Yes [provider]  oxybutynin (DITROPAN-XL) 10 MG 24 hr tablet Take 10 mg by mouth daily. 07/14/22  Yes [provider]  PARoxetine (PAXIL) 20 MG tablet Take 20 mg by mouth daily. 03/27/22  Yes [provider]  albuterol (VENTOLIN HFA) 108 (90 Base) MCG/ACT inhaler Inhale 2 puffs into the lungs every 6 (six) hours as needed for wheezing or shortness of breath. 03/30/20   Wouk, Ailene Rud, MD  INVEGA TRINZA 819 MG/2.63ML injection Inject into the muscle. 03/26/22   [provider]  paliperidone (INVEGA SUSTENNA) 234 MG/1.5ML SUSY injection Inject 234 mg into the muscle every 28 (twenty-eight) days. Patient not taking: Reported on 07/15/2022 04/15/18   Clovis Fredrickson, MD    Family History  Problem Relation Age of Onset   Diabetes Mother  Cancer Mother    Cancer Maternal Grandfather      Social History   Tobacco Use   Smoking status: Every Day    Types: Cigarettes   Smokeless tobacco: Never  Substance Use Topics   Alcohol use: Not Currently   Drug use: Never    Allergies as of 07/15/2022   (No Known Allergies)    Review of Systems:    All systems reviewed and negative except where noted in HPI.   Physical Exam:   Vital signs in last 24 hours: Temp:  [97.4 F (36.3 C)-99.6 F (37.6 C)] 98.4 F (36.9 C) (02/02 0818) Pulse Rate:  [95-106] 102 (02/02 0818) Resp:  [17-20] 17 (02/02 0818) BP: (103-115)/(63-70) 107/66 (02/02 0818) SpO2:  [95 %-98 %] 97 % (02/02 0818) Last BM Date : 07/13/22 General:   thin and cachectic Head:  Normocephalic and atraumatic. Eyes:   No icterus.   Conjunctiva pink. PERRLA. Ears:  Normal auditory acuity. Neck:  Supple; no masses or thyroidomegaly Lungs: air entry b/l equal  Heart:  Regular rate and rhythm;  Without murmur, clicks, rubs or gallops Abdomen:  Soft, nondistended, nontender. Normal bowel sounds. No appreciable masses or hepatomegaly.  No rebound or guarding.  Neurologic:  Alert and oriented x3;  grossly normal neurologically. Psych:  Alert and cooperative. Normal affect.  LAB RESULTS: Recent Labs    07/15/22 1519 07/16/22 0331 07/17/22 0435  WBC 27.4* 15.4* 18.0*  HGB 10.1* 8.9* 9.1*  HCT 31.3* 28.0* 28.3*  PLT 610* 544* 546*   BMET Recent Labs    07/15/22 1519 07/16/22 0331 07/17/22 0435  NA 130* 136 134*  K 3.8 3.7 3.6  CL 94* 104 101  CO2 28 28 27   GLUCOSE 123* 124* 103*  BUN 11 14 10   CREATININE 0.77 0.90 0.66  CALCIUM 9.6 8.9 8.9   LFT Recent Labs    07/16/22 0331  PROT 6.1*  ALBUMIN 1.8*  AST 23  ALT 36  ALKPHOS 94  BILITOT 0.5   PT/INR Recent Labs    07/16/22 0331  LABPROT 15.7*  INR 1.3*    STUDIES: ECHOCARDIOGRAM COMPLETE  Result Date: 07/16/2022    ECHOCARDIOGRAM REPORT   Patient Name:   Kenneth Candelaria. Date of Exam: 07/16/2022 Medical Rec #:  [id number]           Height:       72.0 in Accession #:    [id number]          Weight:       140.0 lb Date of Birth:  September 14, 1960            BSA:          1.831 m Patient Age:    62 years            BP:           111/67 mmHg Patient Gender: M                   HR:           100 bpm. Exam Location:  [facility] Procedure: 2D Echo, Cardiac Doppler and Color Doppler Indications:      Pericardial Effusion I31.3  History:         Patient has prior history of Echocardiogram examinations, most                  recent 03/27/2020. Risk Factors:Hypertension.  Sonographer:     Sherrie Sport Referring Phys:  [id number] Athena Masse Diagnosing Phys: Ida Rogue MD  Sonographer Comments: Image quality was good. IMPRESSIONS  1. Left ventricular ejection fraction, by estimation, is 55 to 60%. The left ventricle has normal function. The left ventricle has no regional wall motion abnormalities. Left ventricular diastolic parameters are consistent with Grade I diastolic dysfunction (impaired relaxation).  2. Right ventricular systolic function is normal. The right ventricular size is normal. There is normal pulmonary artery systolic pressure. The estimated right ventricular systolic pressure is 32.2 mmHg.  3. The mitral valve is normal in structure. No evidence of mitral valve regurgitation. No evidence of mitral stenosis.  4. The aortic valve is tricuspid. Aortic valve regurgitation is not visualized. No aortic stenosis is present.  5. The inferior vena cava is normal in size with greater than 50% respiratory variability, suggesting right atrial pressure of 3 mmHg. FINDINGS  Left Ventricle: Left ventricular ejection fraction, by estimation, is 55 to 60%. The left ventricle has normal function. The left ventricle has no regional wall motion abnormalities. The left ventricular internal cavity size was normal in size. There is  no left ventricular hypertrophy. Left ventricular diastolic parameters are consistent with Grade I diastolic dysfunction (impaired relaxation). Right Ventricle: The right ventricular size is normal. No increase in right ventricular wall thickness. Right ventricular systolic function is normal. There is normal pulmonary artery systolic pressure. The tricuspid regurgitant velocity is 1.44 m/s, and  with an assumed right atrial pressure of 5 mmHg, the estimated right ventricular systolic  pressure is 02.5 mmHg. Left Atrium: Left atrial size was normal in size. Right Atrium: Right atrial size was normal in size. Pericardium: There is no evidence of pericardial effusion. Mitral Valve: The mitral valve is normal in structure. No evidence of mitral valve regurgitation. No evidence of mitral valve stenosis. Tricuspid Valve: The tricuspid valve is normal in structure. Tricuspid valve regurgitation is mild . No evidence of tricuspid stenosis. Aortic Valve: The aortic valve is tricuspid. Aortic valve regurgitation is not visualized. No aortic stenosis is present. Aortic valve mean gradient measures 6.0 mmHg. Aortic valve peak gradient measures 10.2 mmHg. Aortic valve area, by VTI measures 2.83  cm. Pulmonic Valve: The pulmonic valve was normal in structure. Pulmonic valve regurgitation is mild. No evidence of pulmonic stenosis. Aorta: The aortic root is normal in size and structure. Venous: The inferior vena cava is normal in size with greater than 50% respiratory variability, suggesting right atrial pressure of 3 mmHg. IAS/Shunts: No atrial level shunt detected by color flow Doppler.  LEFT VENTRICLE PLAX 2D LVIDd:         4.20 cm   Diastology LVIDs:         2.60 cm   LV e' medial:    11.70 cm/s LV PW:         1.00 cm   LV E/e' medial:  8.3 LV IVS:        0.90 cm   LV e' lateral:   10.20 cm/s LVOT diam:     2.20 cm   LV E/e' lateral: 9.6 LV SV:         66 LV SV Index:   36 LVOT Area:     3.80 cm  RIGHT VENTRICLE RV Basal diam:  2.60 cm RV Mid diam:    2.00 cm RV S prime:     18.70 cm/s TAPSE (M-mode): 1.7 cm LEFT ATRIUM           Index  RIGHT ATRIUM           Index LA diam:      2.40 cm 1.31 cm/m   RA Area:     14.60 cm LA Vol (A2C): 37.4 ml 20.43 ml/m  RA Volume:   38.00 ml  20.75 ml/m LA Vol (A4C): 16.7 ml 9.12 ml/m  AORTIC VALVE AV Area (Vmax):    3.16 cm AV Area (Vmean):   3.03 cm AV Area (VTI):     2.83 cm AV Vmax:           160.00 cm/s AV Vmean:          111.000 cm/s AV VTI:             0.234 m AV Peak Grad:      10.2 mmHg AV Mean Grad:      6.0 mmHg LVOT Vmax:         133.00 cm/s LVOT Vmean:        88.600 cm/s LVOT VTI:          0.174 m LVOT/AV VTI ratio: 0.74  AORTA Ao Root diam: 3.23 cm MITRAL VALVE                TRICUSPID VALVE MV Area (PHT): 4.80 cm     TR Peak grad:   8.3 mmHg MV Decel Time: 158 msec     TR Vmax:        144.00 cm/s MV E velocity: 97.50 cm/s MV A velocity: 108.00 cm/s  SHUNTS MV E/A ratio:  0.90         Systemic VTI:  0.17 m                             Systemic Diam: 2.20 cm Ida Rogue MD Electronically signed by Ida Rogue MD Signature Date/Time: 07/16/2022/2:05:44 PM    Final    CT Chest W Contrast  Result Date: 07/15/2022 CLINICAL DATA:  Chest wall pain infection or inflammation suspected EXAM: CT CHEST WITH CONTRAST TECHNIQUE: Multidetector CT imaging of the chest was performed during intravenous contrast administration. RADIATION DOSE REDUCTION: This exam was performed according to the departmental dose-optimization program which includes automated exposure control, adjustment of the mA and/or kV according to patient size and/or use of iterative reconstruction technique. CONTRAST:  81mL OMNIPAQUE IOHEXOL 300 MG/ML  SOLN COMPARISON:  Radiographs 07/15/2022 and CT chest angiogram 03/26/2020 FINDINGS: Cardiovascular: Normal heart size. Small pericardial effusion. Mild pericardial thickening and hyperenhancement of the visceral and parietal pericardium anteriorly with central low attenuation fluid. Mediastinum/Nodes: Lobular heterogenous mass centered near the left hilum measures 6.2 x 6.8 cm. This causes severe narrowing of the left lower lobe pulmonary artery in the left mainstem pulmonary artery. Unchanged right hilar lymph node measuring 1.5 x 1.9 cm. New left esophageal node measuring 1.3 cm (2/130). Question lower esophageal wall thickening. Lungs/Pleura: Low-attenuation fluid/debris within the left mainstem bronchus and throughout the left lung which is  entirely opacified/collapsed. There is a dominant low-attenuation collection in the left lower lobe with air-fluid level measuring 3.8 cm. This could represent a abscess or necrotic cavitary mass with communication with the bronchi. Diffuse low-attenuation of the left upper lobe likely due to pneumonia. No focal consolidation, pleural effusion, or pneumothorax in the right lung. There are multiple solid nodules in the right lung. For example 9 mm nodule in the medial right upper lobe (3/80); 1.0 cm nodule in the right lower lobe along  the major fissure (3/112); 9 mm cavitary nodule in the right lower lobe (3/89; and right lower lobe subpleural nodule measuring 5 mm (3/116). Intermediate density fluid collection or nodule along the posterior left lower lobe is indeterminate and could represent loculated pleural fluid or subpleural mass (2/132). Low-density fluid along the right upper lobe may be a loculated pleural effusion or developing pneumonia (circa series 2/image 63). Upper Abdomen: Patchy ill-defined rounded heterogenous areas of low attenuation within the mid left kidney as seen on CT abdomen pelvis earlier today. Right adrenal 1.4 cm nodule is stable since CT chest 03/26/2020. Musculoskeletal: Remote left rib fractures. No acute fracture or destructive osseous lesion. IMPRESSION: Heterogenous low-attenuation lobulated mass about the left hilum is indeterminate and may represent a pulmonary abscess versus necrotic mass. Within the left mid lung there is a 3.8 cm cavitary lesion with air-fluid level either due to abscess or necrotic mass with communication with the tracheobronchial tree. Low-attenuation fluid/debris diffusely fills the left lung bronchi including the left mainstem bronchus. Complete opacification of the left lung with low-attenuation fluid/debris in the left upper lobe likely due to pneumonia. Intermediate density fluid collection or nodule along the posterior left lower lobe is indeterminate  and could represent loculated pleural fluid or subpleural mass. Thickening and hyperenhancement of the visceral and parietal pericardium with central low attenuation fluid compatible with pericarditis. Multiple nodules measuring up to 10 mm in the right lung. Differential consideration includes septic emboli or metastases. Unchanged right hilar lymphadenopathy and new lower paraesophageal lymphadenopathy are indeterminate. Continued attention on follow-up. Question circumferential lower esophageal wall thickening. Underlying neoplasm is difficult to exclude. Left presumed pyelonephritis. See report from CT abdomen and pelvis earlier today for details. Electronically Signed   By: Placido Sou M.D.   On: 07/15/2022 19:21   CT ABDOMEN PELVIS W CONTRAST  Result Date: 07/15/2022 CLINICAL DATA:  Acute abdominal pain EXAM: CT ABDOMEN AND PELVIS WITH CONTRAST TECHNIQUE: Multidetector CT imaging of the abdomen and pelvis was performed using the standard protocol following bolus administration of intravenous contrast. RADIATION DOSE REDUCTION: This exam was performed according to the departmental dose-optimization program which includes automated exposure control, adjustment of the mA and/or kV according to patient size and/or use of iterative reconstruction technique. CONTRAST:  161mL OMNIPAQUE IOHEXOL 300 MG/ML  SOLN COMPARISON:  Chest x-ray same day.  CT chest 04/04/2020. FINDINGS: Lower chest: Consolidated lung is seen throughout the lower left hemithorax with volume loss. Hepatobiliary: No focal liver abnormality is seen. No gallstones, gallbladder wall thickening, or biliary dilatation. Pancreas: Unremarkable. No pancreatic ductal dilatation or surrounding inflammatory changes. Spleen: Normal in size without focal abnormality. Adrenals/Urinary Tract: Patchy ill-defined rounded heterogeneous areas of hypodensity are seen in the left mid kidney. The largest area measures proximally 4 cm. There is mild surrounding  inflammatory stranding. There is no hydronephrosis or perinephric fluid collection. Ill-defined patchy area of hypodensity in the right kidney is small in size. Adrenal glands and bladder are within normal limits. Stomach/Bowel: Stomach is within normal limits. Appendix is not seen. No evidence of bowel wall thickening, distention, or inflammatory changes. There is a large amount of stool throughout the colon. Vascular/Lymphatic: Aortic atherosclerosis. No enlarged abdominal or pelvic lymph nodes. Reproductive: Prostate is unremarkable. Other: No abdominal wall hernia or abnormality. No abdominopelvic ascites. Musculoskeletal: No acute or significant osseous findings. IMPRESSION: 1. Patchy ill-defined areas of hypodensity in the left kidney with mild surrounding inflammatory stranding. Findings are concerning for pyelonephritis. Early renal abscesses are not excluded. 2.  Patchy area of hypodensity in the right kidney is small in size and may represent focal pyelonephritis. 3. Consolidated lung in the lower left hemithorax with volume loss. Findings may represent atelectasis or infection. Underlying malignancy not excluded 4. Large amount of stool throughout the colon. Aortic Atherosclerosis (ICD10-I70.0). Electronically Signed   By: Ronney Asters M.D.   On: 07/15/2022 18:35   DG Chest 2 View  Result Date: 07/15/2022 CLINICAL DATA:  Weakness and cough EXAM: CHEST - 2 VIEW COMPARISON:  Chest CT dated March 26, 2020 FINDINGS: Cardiac and mediastinal contours are not visualized. New complete opacification of the left hemithorax. Leftward deviation of the trachea suggests findings are in part due to atelectasis and less likely due to large pleural effusion. Right lung is clear. Old left-sided rib fractures. IMPRESSION: New complete opacification of the left hemithorax. Leftward deviation of the trachea suggests findings are in part due to atelectasis and less likely due to large pleural effusion. Recommend  contrast-enhanced chest CT to assess for underlying malignancy. Electronically Signed   By: Yetta Glassman M.D.   On: 07/15/2022 18:09      Impression / Plan:   Kenneth Laurie. is a 62 y.o. y/o male with a history of paranoid schizophrenia admitted to the hospital with sepsis, lung abscess lung mass on antibiotics, leukocytosis acute pyelonephritis pericarditis incidental finding of thickening of the lower border of the esophageal wall.  I have been consulted to evaluate for the same the patient has iron deficiency anemia and follows with Dr. Grayland Ormond as an outpatient for IV iron.  Plan 1.  He will need an EGD once he has received a few days of treatment for sepsis and we can probably plan to do the EGD just prior to discharge. I believe there are still ruling out TB in him and would be best to avoud endoscopy till these issues are resolved.    Thank you for involving me in the care of this patient.      LOS: 2 days   Kenneth Bellows, MD  07/17/2022, 8:23 AM

## 2022-07-16 NOTE — Progress Notes (Addendum)
Progress Note    Kenneth [name].  KNL:[id number] DOB: June 15, 1961  DOA: 07/15/2022 PCP: Pcp, No      Brief Narrative:    Medical records reviewed and are as summarized below:  Kenneth [name]. is a 62 y.o. male with medical history significant for Paranoid schizophrenia, hypertension, diagnosed with iron deficiency anemia in November 2023 for which he received Venofer x 1 by oncology with recommendation for further treatment and colonoscopy but subsequently lost to follow-up.  He presented to the emergency department (from the group home) with auditory hallucinations.  He is on Invega injections for schizophrenia.  In the emergency department reviewed sepsis, pneumonia, cavitary lesion suspicious for abscess versus necrotic mass.  He was also found to have esophageal wall thickening and pericarditis on CT chest.  He was treated with IV fluids and empiric IV antibiotics.        Assessment/Plan:   Principal Problem:   Schizophrenia, undifferentiated ([name]) Active Problems:   Sepsis ([place])   Septic embolism ([name])   Cavitating mass of lung/ lung abscess   Collapse of left lung   Esophageal abnormality   Pericarditis   Anemia   Underweight   Auditory hallucinations   Tobacco use disorder   Paranoid schizophrenia ([place])   Bipolar 1 disorder ([place])   Hypertension   Pneumonia of left lung due to infectious organism   Chronic obstructive lung disease ([name])    Body mass index is 18.99 kg/m.   Sepsis, left hilar mass (pulmonary abscess versus necrotic mass), left midlung cavitary lesion (abscess versus necrotic mass), right upper lobe density (loculated pleural effusion or developing pneumonia), right lung pulmonary nodules (?  Septic emboli): AFB and QuantiFERON have been ordered to rule out TB. Continue empiric IV antibiotics (antibiotics changed to IV vancomycin and Unasyn.  Consulted ID, Dr. Juleen China to assist with management. Pulmonologist will be consulted as well.  Follow-up blood cultures. Suspected left pyelonephritis or early renal abscess noted on CT abdomen and pelvis but urinalysis was unremarkable and patient has no urinary symptoms.   Esophageal wall thickening: Consulted Dr. Vicente Males, gastroenterologist, to assist with management.   Pericarditis on CT chest: 2D echo has been ordered for further evaluation.   Schizophrenia, bipolar disorder, auditory hallucinations: Consulted psychiatrist to assist with management.   Other comorbidities include tobacco use disorder, chronic anemia, hypertension, memory loss   Diet Order             Diet Heart Room service appropriate? Yes; Fluid consistency: Thin  Diet effective now                            Consultants: Gastroenterologist ID specialist Pulmonologist Psychiatrist  Procedures: None    Medications:    atorvastatin  20 mg Oral Daily   enoxaparin (LOVENOX) injection  40 mg Subcutaneous Q24H   fluticasone furoate-vilanterol  1 puff Inhalation Daily   And   umeclidinium bromide  1 puff Inhalation Daily   hydrOXYzine  25 mg Oral TID   montelukast  10 mg Oral Daily   oxybutynin  10 mg Oral Daily   PARoxetine  20 mg Oral Daily   Continuous Infusions:  ampicillin-sulbactam (UNASYN) IV     vancomycin 750 mg (07/16/22 1137)     Anti-infectives (From admission, onward)    Start     Dose/Rate Route Frequency Ordered Stop   07/16/22 2000  azithromycin (ZITHROMAX) 500 mg in sodium chloride 0.9 %  250 mL IVPB  Status:  Discontinued        500 mg 250 mL/hr over 60 Minutes Intravenous Every 24 hours 07/15/22 2119 07/16/22 1039   07/16/22 1200  vancomycin (VANCOREADY) IVPB 750 mg/150 mL        750 mg 150 mL/hr over 60 Minutes Intravenous Every 12 hours 07/15/22 2144     07/16/22 1200  Ampicillin-Sulbactam (UNASYN) 3 g in sodium chloride 0.9 % 100 mL IVPB        3 g 200 mL/hr over 30 Minutes Intravenous Every 6 hours 07/16/22 1039     07/16/22 0400  ceFEPIme  (MAXIPIME) 2 g in sodium chloride 0.9 % 100 mL IVPB  Status:  Discontinued        2 g 200 mL/hr over 30 Minutes Intravenous Every 8 hours 07/15/22 2144 07/16/22 1039   07/16/22 0000  azithromycin (ZITHROMAX) 500 mg in sodium chloride 0.9 % 250 mL IVPB  Status:  Discontinued        500 mg 250 mL/hr over 60 Minutes Intravenous Every 24 hours 07/15/22 2105 07/15/22 2119   07/15/22 2145  vancomycin (VANCOREADY) IVPB 500 mg/100 mL        500 mg 100 mL/hr over 60 Minutes Intravenous  Once 07/15/22 2144 07/16/22 0830   07/15/22 1915  vancomycin (VANCOCIN) IVPB 1000 mg/200 mL premix        1,000 mg 200 mL/hr over 60 Minutes Intravenous  Once 07/15/22 1900 07/15/22 2143   07/15/22 1915  ceFEPIme (MAXIPIME) 2 g in sodium chloride 0.9 % 100 mL IVPB        2 g 200 mL/hr over 30 Minutes Intravenous  Once 07/15/22 1900 07/15/22 2005   07/15/22 1915  azithromycin (ZITHROMAX) 500 mg in sodium chloride 0.9 % 250 mL IVPB        500 mg 250 mL/hr over 60 Minutes Intravenous  Once 07/15/22 1900 07/15/22 2143              Family Communication/Anticipated D/C date and plan/Code Status   DVT prophylaxis: enoxaparin (LOVENOX) injection 40 mg Start: 07/16/22 2200 SCDs Start: 07/15/22 2104     Code Status: Full Code  Family Communication: None Disposition Plan: Plan to discharge to group home when medically stable.   Status is: Inpatient Remains inpatient appropriate because: IV antibiotics for pneumonia       Subjective:   Interval events noted.  He has no complaints.  He said he did not have any auditory hallucinations or visual hallucinations.  He also said he does not have any cough, shortness of breath, chest pain, hemoptysis, vomiting, diarrhea, abdominal pain or any urinary symptoms.  Objective:    Vitals:   07/16/22 0058 07/16/22 0620 07/16/22 0803 07/16/22 1203  BP: (!) 96/57 106/62 111/67 110/67  Pulse: 92 97 100 (!) 106  Resp: 18 18 20 20   Temp: 98.6 F (37 C) 98.1 F  (36.7 C) 98.4 F (36.9 C) 99.6 F (37.6 C)  TempSrc:      SpO2: 96% 95% 96% 96%  Weight:      Height:       No data found.   Intake/Output Summary (Last 24 hours) at 07/16/2022 1258 Last data filed at 07/16/2022 1610 Gross per 24 hour  Intake 1100 ml  Output 475 ml  Net 625 ml   Filed Weights   07/15/22 1920  Weight: 63.5 kg    Exam:  GEN: NAD SKIN: Warm and dry EYES: EOMI ENT: MMM CV: RRR  PULM: CTA B ABD: soft, ND, NT, +BS CNS: AAO x person, place and time.  Disoriented to situation., non focal EXT: No edema or tenderness PSYCH: Calm and cooperative.  Poor insight and judgment       Data Reviewed:   I have personally reviewed following labs and imaging studies:  Labs: Labs show the following:   Basic Metabolic Panel: Recent Labs  Lab 07/15/22 1519 07/16/22 0331  NA 130* 136  K 3.8 3.7  CL 94* 104  CO2 28 28  GLUCOSE 123* 124*  BUN 11 14  CREATININE 0.77 0.90  CALCIUM 9.6 8.9   GFR Estimated Creatinine Clearance: 78.4 mL/min (by C-G formula based on SCr of 0.9 mg/dL). Liver Function Tests: Recent Labs  Lab 07/15/22 1519 07/16/22 0331  AST 24 23  ALT 44 36  ALKPHOS 110 94  BILITOT 0.7 0.5  PROT 7.2 6.1*  ALBUMIN 2.1* 1.8*   No results for input(s): "LIPASE", "AMYLASE" in the last 168 hours. No results for input(s): "AMMONIA" in the last 168 hours. Coagulation profile Recent Labs  Lab 07/16/22 0331  INR 1.3*    CBC: Recent Labs  Lab 07/15/22 1519 07/16/22 0331  WBC 27.4* 15.4*  HGB 10.1* 8.9*  HCT 31.3* 28.0*  MCV 80.5 81.9  PLT 610* 544*   Cardiac Enzymes: No results for input(s): "CKTOTAL", "CKMB", "CKMBINDEX", "TROPONINI" in the last 168 hours. BNP (last 3 results) No results for input(s): "PROBNP" in the last 8760 hours. CBG: No results for input(s): "GLUCAP" in the last 168 hours. D-Dimer: No results for input(s): "DDIMER" in the last 72 hours. Hgb A1c: No results for input(s): "HGBA1C" in the last 72  hours. Lipid Profile: No results for input(s): "CHOL", "HDL", "LDLCALC", "TRIG", "CHOLHDL", "LDLDIRECT" in the last 72 hours. Thyroid function studies: No results for input(s): "TSH", "T4TOTAL", "T3FREE", "THYROIDAB" in the last 72 hours.  Invalid input(s): "FREET3" Anemia work up: No results for input(s): "VITAMINB12", "FOLATE", "FERRITIN", "TIBC", "IRON", "RETICCTPCT" in the last 72 hours. Sepsis Labs: Recent Labs  Lab 07/15/22 1519 07/15/22 1915 07/15/22 2050 07/16/22 0331  PROCALCITON  --   --   --  0.12  WBC 27.4*  --   --  15.4*  LATICACIDVEN  --  1.3 1.7  --     Microbiology Recent Results (from the past 240 hour(s))  Resp panel by RT-PCR (RSV, Flu A&B, Covid) Anterior Nasal Swab     Status: None   Collection Time: 07/15/22  6:05 PM   Specimen: Anterior Nasal Swab  Result Value Ref Range Status   SARS Coronavirus 2 by RT PCR NEGATIVE NEGATIVE Final    Comment: (NOTE) SARS-CoV-2 target nucleic acids are NOT DETECTED.  The SARS-CoV-2 RNA is generally detectable in upper respiratory specimens during the acute phase of infection. The lowest concentration of SARS-CoV-2 viral copies this assay can detect is 138 copies/mL. A negative result does not preclude SARS-Cov-2 infection and should not be used as the sole basis for treatment or other patient management decisions. A negative result may occur with  improper specimen collection/handling, submission of specimen other than nasopharyngeal swab, presence of viral mutation(s) within the areas targeted by this assay, and inadequate number of viral copies(<138 copies/mL). A negative result must be combined with clinical observations, patient history, and epidemiological information. The expected result is Negative.  Fact Sheet for Patients:  EntrepreneurPulse.com.au  Fact Sheet for Healthcare Providers:  IncredibleEmployment.be  This test is no t yet approved or cleared by the [place]  States FDA and  has been authorized for detection and/or diagnosis of SARS-CoV-2 by FDA under an Emergency Use Authorization (EUA). This EUA will remain  in effect (meaning this test can be used) for the duration of the COVID-19 declaration under Section 564(b)(1) of the Act, 21 U.S.C.section 360bbb-3(b)(1), unless the authorization is terminated  or revoked sooner.       Influenza A by PCR NEGATIVE NEGATIVE Final   Influenza B by PCR NEGATIVE NEGATIVE Final    Comment: (NOTE) The Xpert Xpress SARS-CoV-2/FLU/RSV plus assay is intended as an aid in the diagnosis of influenza from Nasopharyngeal swab specimens and should not be used as a sole basis for treatment. Nasal washings and aspirates are unacceptable for Xpert Xpress SARS-CoV-2/FLU/RSV testing.  Fact Sheet for Patients: EntrepreneurPulse.com.au  Fact Sheet for Healthcare Providers: IncredibleEmployment.be  This test is not yet approved or cleared by the [place] FDA and has been authorized for detection and/or diagnosis of SARS-CoV-2 by FDA under an Emergency Use Authorization (EUA). This EUA will remain in effect (meaning this test can be used) for the duration of the COVID-19 declaration under Section 564(b)(1) of the Act, 21 U.S.C. section 360bbb-3(b)(1), unless the authorization is terminated or revoked.     Resp Syncytial Virus by PCR NEGATIVE NEGATIVE Final    Comment: (NOTE) Fact Sheet for Patients: EntrepreneurPulse.com.au  Fact Sheet for Healthcare Providers: IncredibleEmployment.be  This test is not yet approved or cleared by the [place] FDA and has been authorized for detection and/or diagnosis of SARS-CoV-2 by FDA under an Emergency Use Authorization (EUA). This EUA will remain in effect (meaning this test can be used) for the duration of the COVID-19 declaration under Section 564(b)(1) of the Act, 21 U.S.C. section  360bbb-3(b)(1), unless the authorization is terminated or revoked.  Performed at [facility], [address] [place], [place] [address]   Blood Culture (routine x 2)     Status: None (Preliminary result)   Collection Time: 07/15/22  7:15 PM   Specimen: BLOOD  Result Value Ref Range Status   Specimen Description BLOOD LEFT AC  Final   Special Requests   Final    BOTTLES DRAWN AEROBIC AND ANAEROBIC Blood Culture adequate volume   Culture   Final    NO GROWTH < 24 HOURS Performed at [facility], [address], [place], [place] [address]    Report Status PENDING  Incomplete  Blood Culture (routine x 2)     Status: None (Preliminary result)   Collection Time: 07/15/22  7:15 PM   Specimen: BLOOD  Result Value Ref Range Status   Specimen Description BLOOD RIGHT [facility]  Final   Special Requests   Final    BOTTLES DRAWN AEROBIC AND ANAEROBIC Blood Culture adequate volume   Culture   Final    NO GROWTH < 24 HOURS Performed at [facility], [address] [place], [place] [address]    Report Status PENDING  Incomplete  MRSA Next Gen by PCR, Nasal     Status: None   Collection Time: 07/16/22  9:50 AM   Specimen: Nasal Mucosa; Nasal Swab  Result Value Ref Range Status   MRSA by PCR Next Gen NOT DETECTED NOT DETECTED Final    Comment: (NOTE) The GeneXpert MRSA Assay (FDA approved for NASAL specimens only), is one component of a comprehensive MRSA colonization surveillance program. It is not intended to diagnose MRSA infection nor to guide or monitor treatment for MRSA infections. Test performance is  not FDA approved in patients less than 62 years old. Performed at [facility], [address] [place], [place] [address]     Procedures and diagnostic studies:  CT Chest W Contrast  Result Date: 07/15/2022 CLINICAL DATA:  Chest wall pain infection or inflammation suspected EXAM: CT CHEST WITH CONTRAST TECHNIQUE: Multidetector CT imaging of the  chest was performed during intravenous contrast administration. RADIATION DOSE REDUCTION: This exam was performed according to the departmental dose-optimization program which includes automated exposure control, adjustment of the mA and/or kV according to patient size and/or use of iterative reconstruction technique. CONTRAST:  59mL OMNIPAQUE IOHEXOL 300 MG/ML  SOLN COMPARISON:  Radiographs 07/15/2022 and CT chest angiogram 03/26/2020 FINDINGS: Cardiovascular: Normal heart size. Small pericardial effusion. Mild pericardial thickening and hyperenhancement of the visceral and parietal pericardium anteriorly with central low attenuation fluid. Mediastinum/Nodes: Lobular heterogenous mass centered near the left hilum measures 6.2 x 6.8 cm. This causes severe narrowing of the left lower lobe pulmonary artery in the left mainstem pulmonary artery. Unchanged right hilar lymph node measuring 1.5 x 1.9 cm. New left esophageal node measuring 1.3 cm (2/130). Question lower esophageal wall thickening. Lungs/Pleura: Low-attenuation fluid/debris within the left mainstem bronchus and throughout the left lung which is entirely opacified/collapsed. There is a dominant low-attenuation collection in the left lower lobe with air-fluid level measuring 3.8 cm. This could represent a abscess or necrotic cavitary mass with communication with the bronchi. Diffuse low-attenuation of the left upper lobe likely due to pneumonia. No focal consolidation, pleural effusion, or pneumothorax in the right lung. There are multiple solid nodules in the right lung. For example 9 mm nodule in the medial right upper lobe (3/80); 1.0 cm nodule in the right lower lobe along the major fissure (3/112); 9 mm cavitary nodule in the right lower lobe (3/89; and right lower lobe subpleural nodule measuring 5 mm (3/116). Intermediate density fluid collection or nodule along the posterior left lower lobe is indeterminate and could represent loculated pleural fluid  or subpleural mass (2/132). Low-density fluid along the right upper lobe may be a loculated pleural effusion or developing pneumonia (circa series 2/image 63). Upper Abdomen: Patchy ill-defined rounded heterogenous areas of low attenuation within the mid left kidney as seen on CT abdomen pelvis earlier today. Right adrenal 1.4 cm nodule is stable since CT chest 03/26/2020. Musculoskeletal: Remote left rib fractures. No acute fracture or destructive osseous lesion. IMPRESSION: Heterogenous low-attenuation lobulated mass about the left hilum is indeterminate and may represent a pulmonary abscess versus necrotic mass. Within the left mid lung there is a 3.8 cm cavitary lesion with air-fluid level either due to abscess or necrotic mass with communication with the tracheobronchial tree. Low-attenuation fluid/debris diffusely fills the left lung bronchi including the left mainstem bronchus. Complete opacification of the left lung with low-attenuation fluid/debris in the left upper lobe likely due to pneumonia. Intermediate density fluid collection or nodule along the posterior left lower lobe is indeterminate and could represent loculated pleural fluid or subpleural mass. Thickening and hyperenhancement of the visceral and parietal pericardium with central low attenuation fluid compatible with pericarditis. Multiple nodules measuring up to 10 mm in the right lung. Differential consideration includes septic emboli or metastases. Unchanged right hilar lymphadenopathy and new lower paraesophageal lymphadenopathy are indeterminate. Continued attention on follow-up. Question circumferential lower esophageal wall thickening. Underlying neoplasm is difficult to exclude. Left presumed pyelonephritis. See report from CT abdomen and pelvis earlier today for details. Electronically Signed   By: Placido Sou  M.D.   On: 07/15/2022 19:21   CT ABDOMEN PELVIS W CONTRAST  Result Date: 07/15/2022 CLINICAL DATA:  Acute abdominal pain  EXAM: CT ABDOMEN AND PELVIS WITH CONTRAST TECHNIQUE: Multidetector CT imaging of the abdomen and pelvis was performed using the standard protocol following bolus administration of intravenous contrast. RADIATION DOSE REDUCTION: This exam was performed according to the departmental dose-optimization program which includes automated exposure control, adjustment of the mA and/or kV according to patient size and/or use of iterative reconstruction technique. CONTRAST:  167mL OMNIPAQUE IOHEXOL 300 MG/ML  SOLN COMPARISON:  Chest x-ray same day.  CT chest 04/04/2020. FINDINGS: Lower chest: Consolidated lung is seen throughout the lower left hemithorax with volume loss. Hepatobiliary: No focal liver abnormality is seen. No gallstones, gallbladder wall thickening, or biliary dilatation. Pancreas: Unremarkable. No pancreatic ductal dilatation or surrounding inflammatory changes. Spleen: Normal in size without focal abnormality. Adrenals/Urinary Tract: Patchy ill-defined rounded heterogeneous areas of hypodensity are seen in the left mid kidney. The largest area measures proximally 4 cm. There is mild surrounding inflammatory stranding. There is no hydronephrosis or perinephric fluid collection. Ill-defined patchy area of hypodensity in the right kidney is small in size. Adrenal glands and bladder are within normal limits. Stomach/Bowel: Stomach is within normal limits. Appendix is not seen. No evidence of bowel wall thickening, distention, or inflammatory changes. There is a large amount of stool throughout the colon. Vascular/Lymphatic: Aortic atherosclerosis. No enlarged abdominal or pelvic lymph nodes. Reproductive: Prostate is unremarkable. Other: No abdominal wall hernia or abnormality. No abdominopelvic ascites. Musculoskeletal: No acute or significant osseous findings. IMPRESSION: 1. Patchy ill-defined areas of hypodensity in the left kidney with mild surrounding inflammatory stranding. Findings are concerning for  pyelonephritis. Early renal abscesses are not excluded. 2. Patchy area of hypodensity in the right kidney is small in size and may represent focal pyelonephritis. 3. Consolidated lung in the lower left hemithorax with volume loss. Findings may represent atelectasis or infection. Underlying malignancy not excluded 4. Large amount of stool throughout the colon. Aortic Atherosclerosis (ICD10-I70.0). Electronically Signed   By: Ronney Asters M.D.   On: 07/15/2022 18:35   DG Chest 2 View  Result Date: 07/15/2022 CLINICAL DATA:  Weakness and cough EXAM: CHEST - 2 VIEW COMPARISON:  Chest CT dated March 26, 2020 FINDINGS: Cardiac and mediastinal contours are not visualized. New complete opacification of the left hemithorax. Leftward deviation of the trachea suggests findings are in part due to atelectasis and less likely due to large pleural effusion. Right lung is clear. Old left-sided rib fractures. IMPRESSION: New complete opacification of the left hemithorax. Leftward deviation of the trachea suggests findings are in part due to atelectasis and less likely due to large pleural effusion. Recommend contrast-enhanced chest CT to assess for underlying malignancy. Electronically Signed   By: Yetta Glassman M.D.   On: 07/15/2022 18:09               LOS: 1 day   Shalece Staffa  [facility]   Pager on [email or web address]. If 7PM-7AM, please contact night-coverage at [email or web address]     07/16/2022, 12:58 PM

## 2022-07-16 NOTE — Consult Note (Signed)
[facility]    Date of Admission:  07/15/2022     Reason for Consult:  Cavitary pneumonia     Referring Physician: Dr Mal Misty  Current antibiotics: D Day   Previous antibiotics: Unasyn Vancomycin  ASSESSMENT:    62 y.o. male admitted with:  Pulmonary left hilum mass, left mid lung cavitary lesion, and opacification of the left lung with low-attenuation fluid/debris in the LUL: Differential includes infectious pulmonary abscess, necrotic mass, vs malignancy. Right lung pulmonary nodules: Differential includes septic emboli vs metastatic disease. Left kidney hypodensity with surrounding inflammatory stranding: Radiology read concerning for pyelonephritis/early abscess, however, he has no urinary symptoms and urinalysis was unremarkable. Tobacco use: Patient with ongoing tobacco use disorder since age 76. Anemia: Prior history of IDA.  Hgb today is 8.9.  No prior endoscopic evaluation per his report.  Schizophrenia   RECOMMENDATIONS:    Change antibiotics to vancomycin and Unasyn Check MRSA nares PCR.  If negative, stop vancomycin Follow up blood cultures and TTE Highly suspicious for malignancy vs infection based on history.  Recommend pulmonary consult for bronchoscopy and tissue diagnosis Given history, believe he warrants TB rule out.  Airborne precautions, AFB sputum x 3 negative smears.  Will also check a Quantiferon HIV test pending Do not suspect pyelonephritis.  Query if this could also be some malignant process   Principal Problem:   Septic embolism ([place]) Active Problems:   Schizophrenia, undifferentiated ([place])   Hypertension   Sepsis ([place])   Chronic obstructive lung disease ([name])   Cavitating mass of lung/ lung abscess   Auditory hallucinations   Hyponatremia   Anemia   Esophageal abnormality   Acute pyelonephritis   Underweight   Collapse of left lung   Pericarditis   MEDICATIONS:    Scheduled Meds:  atorvastatin  20 mg  Oral Daily   fluticasone furoate-vilanterol  1 puff Inhalation Daily   And   umeclidinium bromide  1 puff Inhalation Daily   hydrOXYzine  25 mg Oral TID   montelukast  10 mg Oral Daily   oxybutynin  10 mg Oral Daily   PARoxetine  20 mg Oral Daily   Continuous Infusions:  ampicillin-sulbactam (UNASYN) IV     vancomycin     PRN Meds:.acetaminophen **OR** acetaminophen, albuterol, HYDROcodone-acetaminophen, ondansetron **OR** ondansetron (ZOFRAN) IV  HPI:    Kenneth [name]. is a 62 y.o. male with a past medical history of schizophrenia, hypertension, iron deficiency anemia, and tobacco use who presented to the emergency department with complaint of overall just not feeling well.  He reports over the last several weeks that he has had a productive cough producing phlegm.  He also reports night sweats and low-grade fevers at home.  In addition, he has had a 40 pound weight loss over the past few months as well and increased exertional fatigue.  For example, when at [facility] recently, he could not even push the cart down the aisles without getting winded and needing to rest.  He reports having lived in [place] his entire life.  He is currently living in a group home where he has resided for the past 10 to 15 years.  He is in the process of switching group homes and reports having a blood test done last week to evaluate for TB which was negative per his report.  He denies any history of injection drug use.  He used to work in the [employer]  He reports having used  tobacco products since the age of 35 with ongoing daily use.    In the emergency department, he was noted to have a leukocytosis of 27.4 and thrombocytosis of 610.  His CMP was notable for albumin 2.1, sodium 130, and glucose 123.  Urinalysis was unremarkable with no leukocytes and no nitrites.  He had no evidence of lactic acidosis.  Procalcitonin is minimally elevated at 0.12.  He underwent imaging with a chest x-ray  initially that showed opacification of the left hemithorax with leftward deviation of the trachea suggesting atelectasis versus less likely large pleural effusion.  A CT was recommended and subsequently obtained to assess for underlying malignancy.  The CT abdomen/pelvis showed: IMPRESSION: 1. Patchy ill-defined areas of hypodensity in the left kidney with mild surrounding inflammatory stranding. Findings are concerning for pyelonephritis. Early renal abscesses are not excluded. 2. Patchy area of hypodensity in the right kidney is small in size and may represent focal pyelonephritis. 3. Consolidated lung in the lower left hemithorax with volume loss. Findings may represent atelectasis or infection. Underlying malignancy not excluded 4. Large amount of stool throughout the colon.  CT chest showed: IMPRESSION: Heterogenous low-attenuation lobulated mass about the left hilum is indeterminate and may represent a pulmonary abscess versus necrotic mass.   Within the left mid lung there is a 3.8 cm cavitary lesion with air-fluid level either due to abscess or necrotic mass with communication with the tracheobronchial tree.   Low-attenuation fluid/debris diffusely fills the left lung bronchi including the left mainstem bronchus.   Complete opacification of the left lung with low-attenuation fluid/debris in the left upper lobe likely due to pneumonia.   Intermediate density fluid collection or nodule along the posterior left lower lobe is indeterminate and could represent loculated pleural fluid or subpleural mass.   Thickening and hyperenhancement of the visceral and parietal pericardium with central low attenuation fluid compatible with pericarditis.   Multiple nodules measuring up to 10 mm in the right lung. Differential consideration includes septic emboli or metastases.   Unchanged right hilar lymphadenopathy and new lower paraesophageal lymphadenopathy are indeterminate.  Continued attention on follow-up.   Question circumferential lower esophageal wall thickening. Underlying neoplasm is difficult to exclude.   Left presumed pyelonephritis. See report from CT abdomen and pelvis earlier today for details.   He was started on broad spectrum antibiotics and we were consulted for recommendations.      Past Medical History:  Diagnosis Date   Bipolar 1 disorder ([place])    Hypertension    Memory loss    Paranoid schizophrenia ([place])     Social History   Tobacco Use   Smoking status: Every Day    Types: Cigarettes   Smokeless tobacco: Never  Substance Use Topics   Alcohol use: Not Currently   Drug use: Never    Family History  Problem Relation Age of Onset   Diabetes Mother    Cancer Mother    Cancer Maternal Grandfather     No Known Allergies  Review of Systems  Constitutional:  Positive for chills, fever, malaise/fatigue and weight loss.  Respiratory:  Positive for cough, sputum production and shortness of breath. Negative for hemoptysis.   Gastrointestinal: Negative.   Genitourinary: Negative.   Skin: Negative.   All other systems reviewed and are negative.   OBJECTIVE:   Blood pressure 111/67, pulse 100, temperature 98.4 F (36.9 C), resp. rate 20, height 6' (1.829 m), weight 63.5 kg, SpO2 96 %. Body mass index is 18.99 kg/m.  Physical Exam Constitutional:      General: He is not in acute distress.    Appearance: He is not ill-appearing.  HENT:     Head: Normocephalic and atraumatic.     Mouth/Throat:     Comments: He has no teeth present.  Eyes:     Extraocular Movements: Extraocular movements intact.     Conjunctiva/sclera: Conjunctivae normal.  Cardiovascular:     Rate and Rhythm: Normal rate.     Pulses: Normal pulses.  Pulmonary:     Effort: Pulmonary effort is normal. No respiratory distress.  Abdominal:     General: There is no distension.     Palpations: Abdomen is soft.     Tenderness: There is no  abdominal tenderness.  Musculoskeletal:        General: Normal range of motion.     Cervical back: Normal range of motion and neck supple.     Right lower leg: No edema.     Left lower leg: No edema.  Skin:    General: Skin is warm and dry.     Findings: No rash.  Neurological:     General: No focal deficit present.     Mental Status: He is alert and oriented to person, place, and time.  Psychiatric:        Mood and Affect: Mood normal.        Behavior: Behavior normal.      Lab Results: Lab Results  Component Value Date   WBC 15.4 (H) 07/16/2022   HGB 8.9 (L) 07/16/2022   HCT 28.0 (L) 07/16/2022   MCV 81.9 07/16/2022   PLT 544 (H) 07/16/2022    Lab Results  Component Value Date   NA 136 07/16/2022   K 3.7 07/16/2022   CO2 28 07/16/2022   GLUCOSE 124 (H) 07/16/2022   BUN 14 07/16/2022   CREATININE 0.90 07/16/2022   CALCIUM 8.9 07/16/2022   GFRNONAA >60 07/16/2022   GFRAA >60 11/21/2018    Lab Results  Component Value Date   ALT 36 07/16/2022   AST 23 07/16/2022   ALKPHOS 94 07/16/2022   BILITOT 0.5 07/16/2022    No results found for: "CRP"  No results found for: "ESRSEDRATE"  I have reviewed the micro and lab results in Epic.  Imaging: CT Chest W Contrast  Result Date: 07/15/2022 CLINICAL DATA:  Chest wall pain infection or inflammation suspected EXAM: CT CHEST WITH CONTRAST TECHNIQUE: Multidetector CT imaging of the chest was performed during intravenous contrast administration. RADIATION DOSE REDUCTION: This exam was performed according to the departmental dose-optimization program which includes automated exposure control, adjustment of the mA and/or kV according to patient size and/or use of iterative reconstruction technique. CONTRAST:  77mL OMNIPAQUE IOHEXOL 300 MG/ML  SOLN COMPARISON:  Radiographs 07/15/2022 and CT chest angiogram 03/26/2020 FINDINGS: Cardiovascular: Normal heart size. Small pericardial effusion. Mild pericardial thickening and  hyperenhancement of the visceral and parietal pericardium anteriorly with central low attenuation fluid. Mediastinum/Nodes: Lobular heterogenous mass centered near the left hilum measures 6.2 x 6.8 cm. This causes severe narrowing of the left lower lobe pulmonary artery in the left mainstem pulmonary artery. Unchanged right hilar lymph node measuring 1.5 x 1.9 cm. New left esophageal node measuring 1.3 cm (2/130). Question lower esophageal wall thickening. Lungs/Pleura: Low-attenuation fluid/debris within the left mainstem bronchus and throughout the left lung which is entirely opacified/collapsed. There is a dominant low-attenuation collection in the left lower lobe with air-fluid level measuring 3.8 cm. This could represent  a abscess or necrotic cavitary mass with communication with the bronchi. Diffuse low-attenuation of the left upper lobe likely due to pneumonia. No focal consolidation, pleural effusion, or pneumothorax in the right lung. There are multiple solid nodules in the right lung. For example 9 mm nodule in the medial right upper lobe (3/80); 1.0 cm nodule in the right lower lobe along the major fissure (3/112); 9 mm cavitary nodule in the right lower lobe (3/89; and right lower lobe subpleural nodule measuring 5 mm (3/116). Intermediate density fluid collection or nodule along the posterior left lower lobe is indeterminate and could represent loculated pleural fluid or subpleural mass (2/132). Low-density fluid along the right upper lobe may be a loculated pleural effusion or developing pneumonia (circa series 2/image 63). Upper Abdomen: Patchy ill-defined rounded heterogenous areas of low attenuation within the mid left kidney as seen on CT abdomen pelvis earlier today. Right adrenal 1.4 cm nodule is stable since CT chest 03/26/2020. Musculoskeletal: Remote left rib fractures. No acute fracture or destructive osseous lesion. IMPRESSION: Heterogenous low-attenuation lobulated mass about the left hilum  is indeterminate and may represent a pulmonary abscess versus necrotic mass. Within the left mid lung there is a 3.8 cm cavitary lesion with air-fluid level either due to abscess or necrotic mass with communication with the tracheobronchial tree. Low-attenuation fluid/debris diffusely fills the left lung bronchi including the left mainstem bronchus. Complete opacification of the left lung with low-attenuation fluid/debris in the left upper lobe likely due to pneumonia. Intermediate density fluid collection or nodule along the posterior left lower lobe is indeterminate and could represent loculated pleural fluid or subpleural mass. Thickening and hyperenhancement of the visceral and parietal pericardium with central low attenuation fluid compatible with pericarditis. Multiple nodules measuring up to 10 mm in the right lung. Differential consideration includes septic emboli or metastases. Unchanged right hilar lymphadenopathy and new lower paraesophageal lymphadenopathy are indeterminate. Continued attention on follow-up. Question circumferential lower esophageal wall thickening. Underlying neoplasm is difficult to exclude. Left presumed pyelonephritis. See report from CT abdomen and pelvis earlier today for details. Electronically Signed   By: Placido Sou M.D.   On: 07/15/2022 19:21   CT ABDOMEN PELVIS W CONTRAST  Result Date: 07/15/2022 CLINICAL DATA:  Acute abdominal pain EXAM: CT ABDOMEN AND PELVIS WITH CONTRAST TECHNIQUE: Multidetector CT imaging of the abdomen and pelvis was performed using the standard protocol following bolus administration of intravenous contrast. RADIATION DOSE REDUCTION: This exam was performed according to the departmental dose-optimization program which includes automated exposure control, adjustment of the mA and/or kV according to patient size and/or use of iterative reconstruction technique. CONTRAST:  148mL OMNIPAQUE IOHEXOL 300 MG/ML  SOLN COMPARISON:  Chest x-ray same day.   CT chest 04/04/2020. FINDINGS: Lower chest: Consolidated lung is seen throughout the lower left hemithorax with volume loss. Hepatobiliary: No focal liver abnormality is seen. No gallstones, gallbladder wall thickening, or biliary dilatation. Pancreas: Unremarkable. No pancreatic ductal dilatation or surrounding inflammatory changes. Spleen: Normal in size without focal abnormality. Adrenals/Urinary Tract: Patchy ill-defined rounded heterogeneous areas of hypodensity are seen in the left mid kidney. The largest area measures proximally 4 cm. There is mild surrounding inflammatory stranding. There is no hydronephrosis or perinephric fluid collection. Ill-defined patchy area of hypodensity in the right kidney is small in size. Adrenal glands and bladder are within normal limits. Stomach/Bowel: Stomach is within normal limits. Appendix is not seen. No evidence of bowel wall thickening, distention, or inflammatory changes. There is a large amount of  stool throughout the colon. Vascular/Lymphatic: Aortic atherosclerosis. No enlarged abdominal or pelvic lymph nodes. Reproductive: Prostate is unremarkable. Other: No abdominal wall hernia or abnormality. No abdominopelvic ascites. Musculoskeletal: No acute or significant osseous findings. IMPRESSION: 1. Patchy ill-defined areas of hypodensity in the left kidney with mild surrounding inflammatory stranding. Findings are concerning for pyelonephritis. Early renal abscesses are not excluded. 2. Patchy area of hypodensity in the right kidney is small in size and may represent focal pyelonephritis. 3. Consolidated lung in the lower left hemithorax with volume loss. Findings may represent atelectasis or infection. Underlying malignancy not excluded 4. Large amount of stool throughout the colon. Aortic Atherosclerosis (ICD10-I70.0). Electronically Signed   By: Ronney Asters M.D.   On: 07/15/2022 18:35   DG Chest 2 View  Result Date: 07/15/2022 CLINICAL DATA:  Weakness and  cough EXAM: CHEST - 2 VIEW COMPARISON:  Chest CT dated March 26, 2020 FINDINGS: Cardiac and mediastinal contours are not visualized. New complete opacification of the left hemithorax. Leftward deviation of the trachea suggests findings are in part due to atelectasis and less likely due to large pleural effusion. Right lung is clear. Old left-sided rib fractures. IMPRESSION: New complete opacification of the left hemithorax. Leftward deviation of the trachea suggests findings are in part due to atelectasis and less likely due to large pleural effusion. Recommend contrast-enhanced chest CT to assess for underlying malignancy. Electronically Signed   By: Yetta Glassman M.D.   On: 07/15/2022 18:09     Imaging independently reviewed in Epic.  [facility] [phone] pager 07/16/2022, 10:52 AM  I have personally spent 80 minutes involved in face-to-face and non-face-to-face activities for this patient on the day of the visit. Professional time spent includes the following activities: Preparing to see the patient (review of tests), Obtaining and/or reviewing separately obtained history (admission/discharge record), Performing a medically appropriate examination and/or evaluation , Ordering medications/tests/procedures, referring and communicating with other health care professionals, Documenting clinical information in the EMR, Independently interpreting results (not separately reported), Communicating results to the patient/family/caregiver, Counseling and educating the patient/family/caregiver and Care coordination (not separately reported).

## 2022-07-17 ENCOUNTER — Encounter: Payer: Self-pay | Admitting: Internal Medicine

## 2022-07-17 DIAGNOSIS — A419 Sepsis, unspecified organism: Secondary | ICD-10-CM

## 2022-07-17 DIAGNOSIS — E43 Unspecified severe protein-calorie malnutrition: Secondary | ICD-10-CM | POA: Insufficient documentation

## 2022-07-17 DIAGNOSIS — F203 Undifferentiated schizophrenia: Secondary | ICD-10-CM | POA: Diagnosis not present

## 2022-07-17 DIAGNOSIS — J984 Other disorders of lung: Secondary | ICD-10-CM | POA: Diagnosis not present

## 2022-07-17 DIAGNOSIS — R933 Abnormal findings on diagnostic imaging of other parts of digestive tract: Secondary | ICD-10-CM | POA: Diagnosis present

## 2022-07-17 LAB — CBC WITH DIFFERENTIAL/PLATELET
Abs Immature Granulocytes: 0.11 10*3/uL — ABNORMAL HIGH (ref 0.00–0.07)
Basophils Absolute: 0.1 10*3/uL (ref 0.0–0.1)
Basophils Relative: 1 %
Eosinophils Absolute: 0.2 10*3/uL (ref 0.0–0.5)
Eosinophils Relative: 1 %
HCT: 28.3 % — ABNORMAL LOW (ref 39.0–52.0)
Hemoglobin: 9.1 g/dL — ABNORMAL LOW (ref 13.0–17.0)
Immature Granulocytes: 1 %
Lymphocytes Relative: 9 %
Lymphs Abs: 1.6 10*3/uL (ref 0.7–4.0)
MCH: 25.9 pg — ABNORMAL LOW (ref 26.0–34.0)
MCHC: 32.2 g/dL (ref 30.0–36.0)
MCV: 80.6 fL (ref 80.0–100.0)
Monocytes Absolute: 1.3 10*3/uL — ABNORMAL HIGH (ref 0.1–1.0)
Monocytes Relative: 7 %
Neutro Abs: 14.6 10*3/uL — ABNORMAL HIGH (ref 1.7–7.7)
Neutrophils Relative %: 81 %
Platelets: 546 10*3/uL — ABNORMAL HIGH (ref 150–400)
RBC: 3.51 MIL/uL — ABNORMAL LOW (ref 4.22–5.81)
RDW: 15.5 % (ref 11.5–15.5)
WBC: 18 10*3/uL — ABNORMAL HIGH (ref 4.0–10.5)
nRBC: 0 % (ref 0.0–0.2)

## 2022-07-17 LAB — BASIC METABOLIC PANEL
Anion gap: 6 (ref 5–15)
BUN: 10 mg/dL (ref 6–20)
CO2: 27 mmol/L (ref 22–32)
Calcium: 8.9 mg/dL (ref 8.9–10.3)
Chloride: 101 mmol/L (ref 98–111)
Creatinine, Ser: 0.66 mg/dL (ref 0.61–1.24)
GFR, Estimated: >60 mL/min (ref >60)
Glucose, Bld: 103 mg/dL — ABNORMAL HIGH (ref 70–99)
Potassium: 3.6 mmol/L (ref 3.5–5.1)
Sodium: 134 mmol/L — ABNORMAL LOW (ref 135–145)

## 2022-07-17 LAB — MAGNESIUM: Magnesium: 1.8 mg/dL (ref 1.7–2.4)

## 2022-07-17 LAB — GLUCOSE, CAPILLARY: Glucose-Capillary: 132 mg/dL — ABNORMAL HIGH (ref 70–99)

## 2022-07-17 LAB — PHOSPHORUS: Phosphorus: 2.7 mg/dL (ref 2.5–4.6)

## 2022-07-17 MED ORDER — BISACODYL 10 MG RE SUPP
10.0000 mg | Freq: Once | RECTAL | Status: AC
  Administered 2022-07-17: 10 mg via RECTAL
  Filled 2022-07-17: qty 1

## 2022-07-17 MED ORDER — POLYETHYLENE GLYCOL 3350 17 G PO PACK
17.0000 g | PACK | Freq: Every day | ORAL | Status: DC
  Administered 2022-07-17 – 2022-07-27 (×8): 17 g via ORAL
  Filled 2022-07-17 (×9): qty 1

## 2022-07-17 MED ORDER — ADULT MULTIVITAMIN W/MINERALS CH
1.0000 | ORAL_TABLET | Freq: Every day | ORAL | Status: DC
  Administered 2022-07-17 – 2022-07-27 (×11): 1 via ORAL
  Filled 2022-07-17 (×11): qty 1

## 2022-07-17 MED ORDER — SENNOSIDES-DOCUSATE SODIUM 8.6-50 MG PO TABS
1.0000 | ORAL_TABLET | Freq: Every evening | ORAL | Status: DC | PRN
  Administered 2022-07-18 – 2022-07-19 (×2): 1 via ORAL
  Filled 2022-07-17 (×2): qty 1

## 2022-07-17 MED ORDER — ENSURE ENLIVE PO LIQD
237.0000 mL | Freq: Three times a day (TID) | ORAL | Status: DC
  Administered 2022-07-17 – 2022-07-27 (×24): 237 mL via ORAL

## 2022-07-17 NOTE — Progress Notes (Signed)
    [facility]   Date of Admission:  07/15/2022     Reason for visit: Follow up on cavitary pneumonia  Interval History:  No acute events AFB sputum samples not yet collected Afebrile On room air Pulmonary consult pending Blood cx negative and MRSA PCR Negative  Assessment:  Stable today  Recommendations: -- No changes today   [facility] [phone] pager 07/17/2022, 11:01 AM

## 2022-07-17 NOTE — Progress Notes (Signed)
Initial Nutrition Assessment  DOCUMENTATION CODES:   Severe malnutrition in context of chronic illness  INTERVENTION:   -Liberalize diet to regular for wider variety of meal selections -Ensure Enlive po TID, each supplement provides 350 kcal and 20 grams of protein -Magic cup TID with meals, each supplement provides 290 kcal and 9 grams of protein  -MVI with minerals daily  NUTRITION DIAGNOSIS:   Severe Malnutrition related to chronic illness (schizophrenia) as evidenced by moderate fat depletion, severe fat depletion, moderate muscle depletion, severe muscle depletion, percent weight loss.  GOAL:   Patient will meet greater than or equal to 90% of their needs  MONITOR:   PO intake, Supplement acceptance  REASON FOR ASSESSMENT:   Malnutrition Screening Tool    ASSESSMENT:   Pt with medical history significant for Paranoid schizophrenia, hypertension, diagnosed with iron deficiency anemia in November 2023 for which he received Venofer x 1 by oncology with recommendation for further treatment and colonoscopy but subsequently lost to follow-up.  He presented from the group home with auditory hallucinations.  Pt admitted with sepsis, lt hilar mass, and esophageal wall thickening.   Reviewed I/O's: -2 L x 24 hours and -925 ml since admission  UOP: 2.3 L x 24 hours  Per GI notes, plan for EGD towards end of hospitalization.   Per psych notes, pt does not meet criteria for inpatient admission. Group home representative states that pt has experienced weight loss and being more selective with food.   Spoke with pt at bedside, who reports feeling better today. He has not eaten breakfast, but is waiting for his french toast that he ordered to arrive. Pt reports decreased oral intake over the past 6 months, stating "they give me food that I'm not able to eat, it is too hard". Pt unable to provide diet recall or foods that he is unable to eat. Pt denies having difficulty chewing or  swallowing foods that he is provided in the hospital.   Per pt, his UBW is around 170#. He estimates he has lost 30-40# over the past 6 months. Reviewed wt hx; wt has experienced a 16.2% wt loss over the past 3 months, which is significant for time frame.   Medications reviewed and include miralax.   Discussed importance of good meal and supplement intake to promote healing. Pt amenable to supplements.   Labs reviewed: Na: 134, CBGS: 134.   NUTRITION - FOCUSED PHYSICAL EXAM:  Flowsheet Row Most Recent Value  Orbital Region Severe depletion  Upper Arm Region Severe depletion  Thoracic and Lumbar Region Severe depletion  Buccal Region Moderate depletion  Temple Region Severe depletion  Clavicle Bone Region Severe depletion  Clavicle and Acromion Bone Region Severe depletion  Scapular Bone Region Severe depletion  Dorsal Hand Severe depletion  Patellar Region Moderate depletion  Anterior Thigh Region Moderate depletion  Posterior Calf Region Moderate depletion  Edema (RD Assessment) None  Hair Reviewed  Eyes Reviewed  Mouth Reviewed  Skin Reviewed  Nails Reviewed       Diet Order:   Diet Order             Diet Heart Room service appropriate? Yes; Fluid consistency: Thin  Diet effective now                   EDUCATION NEEDS:   Education needs have been addressed  Skin:  Skin Assessment: Reviewed RN Assessment  Last BM:  07/13/22  Height:   Ht Readings from Last 1 Encounters:  07/15/22 6' (1.829 m)    Weight:   Wt Readings from Last 1 Encounters:  07/15/22 63.5 kg    Ideal Body Weight:  80.9 kg  BMI:  Body mass index is 18.99 kg/m.  Estimated Nutritional Needs:   Kcal:  2200-2400  Protein:  125-150 grams  Fluid:  > 2 L    Loistine Chance, RD, LDN, [place] Registered Dietitian II Certified Diabetes Care and Education Specialist Please refer to [facility] for RD and/or RD on-call/weekend/after hours pager

## 2022-07-17 NOTE — Consult Note (Signed)
PULMONOLOGY         Date: 07/17/2022,   MRN# [id number] Kenneth [name]. May 05, 1961     AdmissionWeight: 63.5 kg                 CurrentWeight: 63.5 kg  Referring provider: Dr. Mal Misty   CHIEF COMPLAINT:   Left lung mass   HISTORY OF PRESENT ILLNESS   This is a pleasant 62 year old male with a history of psychiatric disorder and memory loss essential hypertension iron deficiency anemia, who came in from outpatient oncology evaluation due to ongoing progressive anemia.  Patient reported hallucinations also reports cough denies shortness of breath had a CT chest performed which shows left lung mass.  He denies chest pain or discomfort.  He denies SOB.  He shares his grandfather had colon cancer.    He smokes 3 cigs a day and he started smoking at age 11, he used to smoke 1 pack a day.    PCCM consultation for additional evaluation management.   PAST MEDICAL HISTORY   Past Medical History:  Diagnosis Date   Bipolar 1 disorder ([place])    Hypertension    Memory loss    Paranoid schizophrenia ([place])      SURGICAL HISTORY   No past surgical history on file.   FAMILY HISTORY   Family History  Problem Relation Age of Onset   Diabetes Mother    Cancer Mother    Cancer Maternal Grandfather      SOCIAL HISTORY   Social History   Tobacco Use   Smoking status: Every Day    Types: Cigarettes   Smokeless tobacco: Never  Substance Use Topics   Alcohol use: Not Currently   Drug use: Never     MEDICATIONS    Home Medication:    Current Medication:  Current Facility-Administered Medications:    acetaminophen (TYLENOL) tablet 650 mg, 650 mg, Oral, Q6H PRN **OR** acetaminophen (TYLENOL) suppository 650 mg, 650 mg, Rectal, Q6H PRN, Athena Masse, MD   albuterol (PROVENTIL) (2.5 MG/3ML) 0.083% nebulizer solution 2.5 mg, 2.5 mg, Nebulization, Q6H PRN, Athena Masse, MD   Ampicillin-Sulbactam (UNASYN) 3 g in sodium chloride 0.9 % 100 mL IVPB, 3 g,  Intravenous, Q6H, Mignon Pine, DO, Last Rate: 200 mL/hr at 07/17/22 0557, 3 g at 07/17/22 0557   atorvastatin (LIPITOR) tablet 20 mg, 20 mg, Oral, Daily, Judd Gaudier V, MD, 20 mg at 07/17/22 3151   bisacodyl (DULCOLAX) suppository 10 mg, 10 mg, Rectal, Once, Jennye Boroughs, MD   enoxaparin (LOVENOX) injection 40 mg, 40 mg, Subcutaneous, Q24H, Jennye Boroughs, MD, 40 mg at 07/16/22 2125   fluticasone furoate-vilanterol (BREO ELLIPTA) 200-25 MCG/ACT 1 puff, 1 puff, Inhalation, Daily, 1 puff at 07/17/22 0927 **AND** umeclidinium bromide (INCRUSE ELLIPTA) 62.5 MCG/ACT 1 puff, 1 puff, Inhalation, Daily, Judd Gaudier V, MD, 1 puff at 07/17/22 7616   HYDROcodone-acetaminophen (NORCO/VICODIN) 5-325 MG per tablet 1-2 tablet, 1-2 tablet, Oral, Q4H PRN, Athena Masse, MD, 1 tablet at 07/15/22 2303   hydrOXYzine (ATARAX) tablet 25 mg, 25 mg, Oral, TID, Athena Masse, MD, 25 mg at 07/17/22 0925   montelukast (SINGULAIR) tablet 10 mg, 10 mg, Oral, Daily, Judd Gaudier V, MD, 10 mg at 07/17/22 0925   ondansetron (ZOFRAN) tablet 4 mg, 4 mg, Oral, Q6H PRN **OR** ondansetron (ZOFRAN) injection 4 mg, 4 mg, Intravenous, Q6H PRN, Athena Masse, MD   oxybutynin (DITROPAN-XL) 24 hr tablet 10 mg, 10 mg, Oral, Daily,  Athena Masse, MD, 10 mg at 07/17/22 1610   paliperidone (INVEGA) 24 hr tablet 3 mg, 3 mg, Oral, Daily, Bennett, Christal H, NP, 3 mg at 07/17/22 9604   PARoxetine (PAXIL) tablet 20 mg, 20 mg, Oral, Daily, Judd Gaudier V, MD, 20 mg at 07/17/22 5409   polyethylene glycol (MIRALAX / GLYCOLAX) packet 17 g, 17 g, Oral, Daily, Jennye Boroughs, MD   senna-docusate (Senokot-S) tablet 1 tablet, 1 tablet, Oral, QHS PRN, Jennye Boroughs, MD    ALLERGIES   Patient has no known allergies.     REVIEW OF SYSTEMS    Review of Systems:  Gen:  Denies  fever, sweats, chills weigh loss  HEENT: Denies blurred vision, double vision, ear pain, eye pain, hearing loss, nose bleeds, sore throat Cardiac:  No  dizziness, chest pain or heaviness, chest tightness,edema Resp:   reports dyspnea chronically  Gi: Denies swallowing difficulty, stomach pain, nausea or vomiting, diarrhea, constipation, bowel incontinence Gu:  Denies bladder incontinence, burning urine Ext:   Denies Joint pain, stiffness or swelling Skin: Denies  skin rash, easy bruising or bleeding or hives Endoc:  Denies polyuria, polydipsia , polyphagia or weight change Psych:   Denies depression, insomnia or hallucinations   Other:  All other systems negative   VS: BP 107/66 (BP Location: Left Arm)   Pulse (!) 102   Temp 98.4 F (36.9 C) (Oral)   Resp 17   Ht 6' (1.829 m)   Wt 63.5 kg   SpO2 97%   BMI 18.99 kg/m      PHYSICAL EXAM    GENERAL:NAD, no fevers, chills, no weakness no fatigue HEAD: Normocephalic, atraumatic.  EYES: Pupils equal, round, reactive to light. Extraocular muscles intact. No scleral icterus.  MOUTH: Moist mucosal membrane. Dentition intact. No abscess noted.  EAR, NOSE, THROAT: Clear without exudates. No external lesions.  NECK: Supple. No thyromegaly. No nodules. No JVD.  PULMONARY: decreased breath sounds with mild rhonchi worse at bases bilaterally.  CARDIOVASCULAR: S1 and S2. Regular rate and rhythm. No murmurs, rubs, or gallops. No edema. Pedal pulses 2+ bilaterally.  GASTROINTESTINAL: Soft, nontender, nondistended. No masses. Positive bowel sounds. No hepatosplenomegaly.  MUSCULOSKELETAL: No swelling, clubbing, or edema. Range of motion full in all extremities.  NEUROLOGIC: Cranial nerves II through XII are intact. No gross focal neurological deficits. Sensation intact. Reflexes intact.  SKIN: No ulceration, lesions, rashes, or cyanosis. Skin warm and dry. Turgor intact.  PSYCHIATRIC: Mood, affect within normal limits. The patient is awake, alert and oriented x 3. Insight, judgment intact.       IMAGING     ASSESSMENT/PLAN   Left lung mass   Patient interested in diagnosis and  treatment -we will likely need to set up PET scan on outpatient and biopsies based on this study - Oncology on case appreciate input -Patient shares he wants everything done.          Thank you for allowing me to participate in the care of this patient.   Patient/Family are satisfied with care plan and all questions have been answered.    Provider disclosure: Patient with at least one acute or chronic illness or injury that poses a threat to life or bodily function and is being managed actively during this encounter.  All of the below services have been performed independently by signing provider:  review of prior documentation from internal and or external health records.  Review of previous and current lab results.  Interview and comprehensive assessment during  patient visit today. Review of current and previous chest radiographs/CT scans. Discussion of management and test interpretation with health care team and patient/family.   This document was prepared using Dragon voice recognition software and may include unintentional dictation errors.     Ottie Glazier, M.D.  Division of Pulmonary & Critical Care Medicine

## 2022-07-17 NOTE — Progress Notes (Signed)
Progress Note    Kenneth Pluck.  CHE:[id number] DOB: [birth year]  DOA: 07/15/2022 PCP: Pcp, No      Brief Narrative:    Medical records reviewed and are as summarized below:  Kenneth Pluck. is a 62 y.o. male with medical history significant for Paranoid schizophrenia, hypertension, diagnosed with iron deficiency anemia in November 2023 for which he received Venofer x 1 by oncology with recommendation for further treatment and colonoscopy but subsequently lost to follow-up.  He presented to the emergency department (from the group home) with auditory hallucinations.  He is on Invega injections for schizophrenia.  In the emergency department reviewed sepsis, pneumonia, cavitary lesion suspicious for abscess versus necrotic mass.  He was also found to have esophageal wall thickening and pericarditis on CT chest.  He was treated with IV fluids and empiric IV antibiotics.        Assessment/Plan:   Principal Problem:   Schizophrenia, undifferentiated ([name]) Active Problems:   Sepsis ([place])   Septic embolism ([name])   Cavitating mass of lung/ lung abscess   Collapse of left lung   Esophageal abnormality   Pericarditis   Anemia   Underweight   Auditory hallucinations   Tobacco use disorder   Paranoid schizophrenia ([place])   Bipolar 1 disorder ([name])   Hypertension   Pneumonia of left lung due to infectious organism   Chronic obstructive lung disease ([name])   Abnormal CT scan, esophagus    Body mass index is 18.99 kg/m.   Sepsis, left hilar mass (pulmonary abscess versus necrotic mass), left midlung cavitary lesion (abscess versus necrotic mass), right upper lobe density (loculated pleural effusion or developing pneumonia), right lung pulmonary nodules (?  Septic emboli): AFB and QuantiFERON have been ordered to rule out TB.  IV vancomycin has been discontinued because MRSA PCR screen was negative.  Continue IV Unasyn.  Consulted Dr. Lanney Gins, pulmonologist, to assist  with management.  Follow-up with ID.  Follow-up blood cultures. Suspected left pyelonephritis or early renal abscess noted on CT abdomen and pelvis but urinalysis was unremarkable and patient has no urinary symptoms.   Esophageal wall thickening: Plan for EGD later in the course of hospitalization.  Follow-up with gastroenterologist.     Pericarditis on CT chest: No chest pain.  No evidence of pericardial effusion on CT chest.   Schizophrenia, bipolar disorder, auditory hallucinations: He has been started on hydroxyzine, paliperidone and paroxetine.  Appreciate help from psychiatrist.     Other comorbidities include tobacco use disorder, chronic anemia, hypertension, memory loss   Diet Order             Diet Heart Room service appropriate? Yes; Fluid consistency: Thin  Diet effective now                            Consultants: Gastroenterologist ID specialist Pulmonologist Psychiatrist  Procedures: None    Medications:    atorvastatin  20 mg Oral Daily   enoxaparin (LOVENOX) injection  40 mg Subcutaneous Q24H   fluticasone furoate-vilanterol  1 puff Inhalation Daily   And   umeclidinium bromide  1 puff Inhalation Daily   hydrOXYzine  25 mg Oral TID   montelukast  10 mg Oral Daily   oxybutynin  10 mg Oral Daily   paliperidone  3 mg Oral Daily   PARoxetine  20 mg Oral Daily   polyethylene glycol  17 g Oral Daily   Continuous  Infusions:  ampicillin-sulbactam (UNASYN) IV 3 g (07/17/22 1205)     Anti-infectives (From admission, onward)    Start     Dose/Rate Route Frequency Ordered Stop   07/16/22 2000  azithromycin (ZITHROMAX) 500 mg in sodium chloride 0.9 % 250 mL IVPB  Status:  Discontinued        500 mg 250 mL/hr over 60 Minutes Intravenous Every 24 hours 07/15/22 2119 07/16/22 1039   07/16/22 1200  vancomycin (VANCOREADY) IVPB 750 mg/150 mL  Status:  Discontinued        750 mg 150 mL/hr over 60 Minutes Intravenous Every 12 hours 07/15/22 2144  07/16/22 1613   07/16/22 1200  Ampicillin-Sulbactam (UNASYN) 3 g in sodium chloride 0.9 % 100 mL IVPB        3 g 200 mL/hr over 30 Minutes Intravenous Every 6 hours 07/16/22 1039     07/16/22 0400  ceFEPIme (MAXIPIME) 2 g in sodium chloride 0.9 % 100 mL IVPB  Status:  Discontinued        2 g 200 mL/hr over 30 Minutes Intravenous Every 8 hours 07/15/22 2144 07/16/22 1039   07/16/22 0000  azithromycin (ZITHROMAX) 500 mg in sodium chloride 0.9 % 250 mL IVPB  Status:  Discontinued        500 mg 250 mL/hr over 60 Minutes Intravenous Every 24 hours 07/15/22 2105 07/15/22 2119   07/15/22 2145  vancomycin (VANCOREADY) IVPB 500 mg/100 mL        500 mg 100 mL/hr over 60 Minutes Intravenous  Once 07/15/22 2144 07/16/22 0830   07/15/22 1915  vancomycin (VANCOCIN) IVPB 1000 mg/200 mL premix        1,000 mg 200 mL/hr over 60 Minutes Intravenous  Once 07/15/22 1900 07/15/22 2143   07/15/22 1915  ceFEPIme (MAXIPIME) 2 g in sodium chloride 0.9 % 100 mL IVPB        2 g 200 mL/hr over 30 Minutes Intravenous  Once 07/15/22 1900 07/15/22 2005   07/15/22 1915  azithromycin (ZITHROMAX) 500 mg in sodium chloride 0.9 % 250 mL IVPB        500 mg 250 mL/hr over 60 Minutes Intravenous  Once 07/15/22 1900 07/15/22 2143              Family Communication/Anticipated D/C date and plan/Code Status   DVT prophylaxis: enoxaparin (LOVENOX) injection 40 mg Start: 07/16/22 2200 SCDs Start: 07/15/22 2104     Code Status: Full Code  Family Communication: None Disposition Plan: Plan to discharge to group home when medically stable.   Status is: Inpatient Remains inpatient appropriate because: IV antibiotics for pneumonia       Subjective:   Interval events noted.  He has no complaints.  No chest pain, shortness of breath or cough.  No hallucinations  Objective:    Vitals:   07/17/22 0101 07/17/22 0544 07/17/22 0818 07/17/22 1146  BP: 115/70 107/66 107/66 (!) 94/54  Pulse: 97 95 (!) 102 99   Resp: 19 20 17 20   Temp: 98.8 F (37.1 C) (!) 97.4 F (36.3 C) 98.4 F (36.9 C) 98.7 F (37.1 C)  TempSrc: Oral  Oral   SpO2: 95% 97% 97% 98%  Weight:      Height:       No data found.   Intake/Output Summary (Last 24 hours) at 07/17/2022 1219 Last data filed at 07/17/2022 0817 Gross per 24 hour  Intake 249.66 ml  Output 2400 ml  Net -2150.34 ml   [employer]  07/15/22 1920  Weight: 63.5 kg    Exam:  GEN: NAD SKIN: Warm and dry EYES: EOMI ENT: MMM CV: RRR PULM: CTA B ABD: soft, ND, NT, +BS CNS: AAO x 3 but disoriented to situation.  Nonfocal. EXT: No edema or tenderness PSYCH: Calm and cooperative.    Data Reviewed:   I have personally reviewed following labs and imaging studies:  Labs: Labs show the following:   Basic Metabolic Panel: Recent Labs  Lab 07/15/22 1519 07/16/22 0331 07/17/22 0435  NA 130* 136 134*  K 3.8 3.7 3.6  CL 94* 104 101  CO2 28 28 27   GLUCOSE 123* 124* 103*  BUN 11 14 10   CREATININE 0.77 0.90 0.66  CALCIUM 9.6 8.9 8.9  MG  --   --  1.8  PHOS  --   --  2.7   GFR Estimated Creatinine Clearance: 88.2 mL/min (by C-G formula based on SCr of 0.66 mg/dL). Liver Function Tests: Recent Labs  Lab 07/15/22 1519 07/16/22 0331  AST 24 23  ALT 44 36  ALKPHOS 110 94  BILITOT 0.7 0.5  PROT 7.2 6.1*  ALBUMIN 2.1* 1.8*   No results for input(s): "LIPASE", "AMYLASE" in the last 168 hours. No results for input(s): "AMMONIA" in the last 168 hours. Coagulation profile Recent Labs  Lab 07/16/22 0331  INR 1.3*    CBC: Recent Labs  Lab 07/15/22 1519 07/16/22 0331 07/17/22 0435  WBC 27.4* 15.4* 18.0*  NEUTROABS  --   --  14.6*  HGB 10.1* 8.9* 9.1*  HCT 31.3* 28.0* 28.3*  MCV 80.5 81.9 80.6  PLT 610* 544* 546*   Cardiac Enzymes: No results for input(s): "CKTOTAL", "CKMB", "CKMBINDEX", "TROPONINI" in the last 168 hours. BNP (last 3 results) No results for input(s): "PROBNP" in the last 8760 hours. CBG: Recent Labs   Lab 07/17/22 0420  GLUCAP 132*   D-Dimer: No results for input(s): "DDIMER" in the last 72 hours. Hgb A1c: No results for input(s): "HGBA1C" in the last 72 hours. Lipid Profile: No results for input(s): "CHOL", "HDL", "LDLCALC", "TRIG", "CHOLHDL", "LDLDIRECT" in the last 72 hours. Thyroid function studies: No results for input(s): "TSH", "T4TOTAL", "T3FREE", "THYROIDAB" in the last 72 hours.  Invalid input(s): "FREET3" Anemia work up: No results for input(s): "VITAMINB12", "FOLATE", "FERRITIN", "TIBC", "IRON", "RETICCTPCT" in the last 72 hours. Sepsis Labs: Recent Labs  Lab 07/15/22 1519 07/15/22 1915 07/15/22 2050 07/16/22 0331 07/17/22 0435  PROCALCITON  --   --   --  0.12  --   WBC 27.4*  --   --  15.4* 18.0*  LATICACIDVEN  --  1.3 1.7  --   --     Microbiology Recent Results (from the past 240 hour(s))  Resp panel by RT-PCR (RSV, Flu A&B, Covid) Anterior Nasal Swab     Status: None   Collection Time: 07/15/22  6:05 PM   Specimen: Anterior Nasal Swab  Result Value Ref Range Status   SARS Coronavirus 2 by RT PCR NEGATIVE NEGATIVE Final    Comment: (NOTE) SARS-CoV-2 target nucleic acids are NOT DETECTED.  The SARS-CoV-2 RNA is generally detectable in upper respiratory specimens during the acute phase of infection. The lowest concentration of SARS-CoV-2 viral copies this assay can detect is 138 copies/mL. A negative result does not preclude SARS-Cov-2 infection and should not be used as the sole basis for treatment or other patient management decisions. A negative result may occur with  improper specimen collection/handling, submission of specimen other than nasopharyngeal swab,  presence of viral mutation(s) within the areas targeted by this assay, and inadequate number of viral copies(<138 copies/mL). A negative result must be combined with clinical observations, patient history, and epidemiological information. The expected result is Negative.  Fact Sheet for  Patients:  EntrepreneurPulse.com.au  Fact Sheet for Healthcare Providers:  IncredibleEmployment.be  This test is no t yet approved or cleared by the [place] FDA and  has been authorized for detection and/or diagnosis of SARS-CoV-2 by FDA under an Emergency Use Authorization (EUA). This EUA will remain  in effect (meaning this test can be used) for the duration of the COVID-19 declaration under Section 564(b)(1) of the Act, 21 U.S.C.section 360bbb-3(b)(1), unless the authorization is terminated  or revoked sooner.       Influenza A by PCR NEGATIVE NEGATIVE Final   Influenza B by PCR NEGATIVE NEGATIVE Final    Comment: (NOTE) The Xpert Xpress SARS-CoV-2/FLU/RSV plus assay is intended as an aid in the diagnosis of influenza from Nasopharyngeal swab specimens and should not be used as a sole basis for treatment. Nasal washings and aspirates are unacceptable for Xpert Xpress SARS-CoV-2/FLU/RSV testing.  Fact Sheet for Patients: EntrepreneurPulse.com.au  Fact Sheet for Healthcare Providers: IncredibleEmployment.be  This test is not yet approved or cleared by the [place] FDA and has been authorized for detection and/or diagnosis of SARS-CoV-2 by FDA under an Emergency Use Authorization (EUA). This EUA will remain in effect (meaning this test can be used) for the duration of the COVID-19 declaration under Section 564(b)(1) of the Act, 21 U.S.C. section 360bbb-3(b)(1), unless the authorization is terminated or revoked.     Resp Syncytial Virus by PCR NEGATIVE NEGATIVE Final    Comment: (NOTE) Fact Sheet for Patients: EntrepreneurPulse.com.au  Fact Sheet for Healthcare Providers: IncredibleEmployment.be  This test is not yet approved or cleared by the [place] FDA and has been authorized for detection and/or diagnosis of SARS-CoV-2 by FDA under an Emergency  Use Authorization (EUA). This EUA will remain in effect (meaning this test can be used) for the duration of the COVID-19 declaration under Section 564(b)(1) of the Act, 21 U.S.C. section 360bbb-3(b)(1), unless the authorization is terminated or revoked.  Performed at [facility], [address] [place], [place] [address]   Blood Culture (routine x 2)     Status: None (Preliminary result)   Collection Time: 07/15/22  7:15 PM   Specimen: BLOOD  Result Value Ref Range Status   Specimen Description BLOOD LEFT [facility]  Final   Special Requests   Final    BOTTLES DRAWN AEROBIC AND ANAEROBIC Blood Culture adequate volume   Culture   Final    NO GROWTH 2 DAYS Performed at [facility], [address] [place], [place] [address]    Report Status PENDING  Incomplete  Blood Culture (routine x 2)     Status: None (Preliminary result)   Collection Time: 07/15/22  7:15 PM   Specimen: BLOOD  Result Value Ref Range Status   Specimen Description BLOOD RIGHT [facility]  Final   Special Requests   Final    BOTTLES DRAWN AEROBIC AND ANAEROBIC Blood Culture adequate volume   Culture   Final    NO GROWTH 2 DAYS Performed at [facility], [address], [place], [place] [address]    Report Status PENDING  Incomplete  MRSA Next Gen by PCR, Nasal     Status: None   Collection Time: 07/16/22  9:50 AM   Specimen: Nasal Mucosa; Nasal  Swab  Result Value Ref Range Status   MRSA by PCR Next Gen NOT DETECTED NOT DETECTED Final    Comment: (NOTE) The GeneXpert MRSA Assay (FDA approved for NASAL specimens only), is one component of a comprehensive MRSA colonization surveillance program. It is not intended to diagnose MRSA infection nor to guide or monitor treatment for MRSA infections. Test performance is not FDA approved in patients less than 56 years old. Performed at [facility], [address], [place], [place] [address]     Procedures and diagnostic  studies:  ECHOCARDIOGRAM COMPLETE  Result Date: 07/16/2022    ECHOCARDIOGRAM REPORT   Patient Name:   Kenneth [name]. Date of Exam: 07/16/2022 Medical Rec #:  [id number]           Height:       72.0 in Accession #:    [id number]          Weight:       140.0 lb Date of Birth:  04-Nov-1960            BSA:          1.831 m Patient Age:    38 years            BP:           111/67 mmHg Patient Gender: M                   HR:           100 bpm. Exam Location:  [facility] Procedure: 2D Echo, Cardiac Doppler and Color Doppler Indications:     Pericardial Effusion I31.3  History:         Patient has prior history of Echocardiogram examinations, most                  recent 03/27/2020. Risk Factors:Hypertension.  Sonographer:     Sherrie Sport Referring Phys:  [id number] Athena Masse Diagnosing Phys: Ida Rogue MD  Sonographer Comments: Image quality was good. IMPRESSIONS  1. Left ventricular ejection fraction, by estimation, is 55 to 60%. The left ventricle has normal function. The left ventricle has no regional wall motion abnormalities. Left ventricular diastolic parameters are consistent with Grade I diastolic dysfunction (impaired relaxation).  2. Right ventricular systolic function is normal. The right ventricular size is normal. There is normal pulmonary artery systolic pressure. The estimated right ventricular systolic pressure is 42.7 mmHg.  3. The mitral valve is normal in structure. No evidence of mitral valve regurgitation. No evidence of mitral stenosis.  4. The aortic valve is tricuspid. Aortic valve regurgitation is not visualized. No aortic stenosis is present.  5. The inferior vena cava is normal in size with greater than 50% respiratory variability, suggesting right atrial pressure of 3 mmHg. FINDINGS  Left Ventricle: Left ventricular ejection fraction, by estimation, is 55 to 60%. The left ventricle has normal function. The left ventricle has no regional wall motion abnormalities. The left ventricular internal  cavity size was normal in size. There is  no left ventricular hypertrophy. Left ventricular diastolic parameters are consistent with Grade I diastolic dysfunction (impaired relaxation). Right Ventricle: The right ventricular size is normal. No increase in right ventricular wall thickness. Right ventricular systolic function is normal. There is normal pulmonary artery systolic pressure. The tricuspid regurgitant velocity is 1.44 m/s, and  with an assumed right atrial pressure of 5 mmHg, the estimated right ventricular systolic pressure is 06.2 mmHg. Left Atrium: Left atrial size  was normal in size. Right Atrium: Right atrial size was normal in size. Pericardium: There is no evidence of pericardial effusion. Mitral Valve: The mitral valve is normal in structure. No evidence of mitral valve regurgitation. No evidence of mitral valve stenosis. Tricuspid Valve: The tricuspid valve is normal in structure. Tricuspid valve regurgitation is mild . No evidence of tricuspid stenosis. Aortic Valve: The aortic valve is tricuspid. Aortic valve regurgitation is not visualized. No aortic stenosis is present. Aortic valve mean gradient measures 6.0 mmHg. Aortic valve peak gradient measures 10.2 mmHg. Aortic valve area, by VTI measures 2.83  cm. Pulmonic Valve: The pulmonic valve was normal in structure. Pulmonic valve regurgitation is mild. No evidence of pulmonic stenosis. Aorta: The aortic root is normal in size and structure. Venous: The inferior vena cava is normal in size with greater than 50% respiratory variability, suggesting right atrial pressure of 3 mmHg. IAS/Shunts: No atrial level shunt detected by color flow Doppler.  LEFT VENTRICLE PLAX 2D LVIDd:         4.20 cm   Diastology LVIDs:         2.60 cm   LV e' medial:    11.70 cm/s LV PW:         1.00 cm   LV E/e' medial:  8.3 LV IVS:        0.90 cm   LV e' lateral:   10.20 cm/s LVOT diam:     2.20 cm   LV E/e' lateral: 9.6 LV SV:         66 LV SV Index:   36 LVOT Area:      3.80 cm  RIGHT VENTRICLE RV Basal diam:  2.60 cm RV Mid diam:    2.00 cm RV S prime:     18.70 cm/s TAPSE (M-mode): 1.7 cm LEFT ATRIUM           Index        RIGHT ATRIUM           Index LA diam:      2.40 cm 1.31 cm/m   RA Area:     14.60 cm LA Vol (A2C): 37.4 ml 20.43 ml/m  RA Volume:   38.00 ml  20.75 ml/m LA Vol (A4C): 16.7 ml 9.12 ml/m  AORTIC VALVE AV Area (Vmax):    3.16 cm AV Area (Vmean):   3.03 cm AV Area (VTI):     2.83 cm AV Vmax:           160.00 cm/s AV Vmean:          111.000 cm/s AV VTI:            0.234 m AV Peak Grad:      10.2 mmHg AV Mean Grad:      6.0 mmHg LVOT Vmax:         133.00 cm/s LVOT Vmean:        88.600 cm/s LVOT VTI:          0.174 m LVOT/AV VTI ratio: 0.74  AORTA Ao Root diam: 3.23 cm MITRAL VALVE                TRICUSPID VALVE MV Area (PHT): 4.80 cm     TR Peak grad:   8.3 mmHg MV Decel Time: 158 msec     TR Vmax:        144.00 cm/s MV E velocity: 97.50 cm/s MV A velocity: 108.00 cm/s  SHUNTS MV E/A ratio:  0.90  Systemic VTI:  0.17 m                             Systemic Diam: 2.20 cm Ida Rogue MD Electronically signed by Ida Rogue MD Signature Date/Time: 07/16/2022/2:05:44 PM    Final    CT Chest W Contrast  Result Date: 07/15/2022 CLINICAL DATA:  Chest wall pain infection or inflammation suspected EXAM: CT CHEST WITH CONTRAST TECHNIQUE: Multidetector CT imaging of the chest was performed during intravenous contrast administration. RADIATION DOSE REDUCTION: This exam was performed according to the departmental dose-optimization program which includes automated exposure control, adjustment of the mA and/or kV according to patient size and/or use of iterative reconstruction technique. CONTRAST:  70mL OMNIPAQUE IOHEXOL 300 MG/ML  SOLN COMPARISON:  Radiographs 07/15/2022 and CT chest angiogram 03/26/2020 FINDINGS: Cardiovascular: Normal heart size. Small pericardial effusion. Mild pericardial thickening and hyperenhancement of the visceral and parietal  pericardium anteriorly with central low attenuation fluid. Mediastinum/Nodes: Lobular heterogenous mass centered near the left hilum measures 6.2 x 6.8 cm. This causes severe narrowing of the left lower lobe pulmonary artery in the left mainstem pulmonary artery. Unchanged right hilar lymph node measuring 1.5 x 1.9 cm. New left esophageal node measuring 1.3 cm (2/130). Question lower esophageal wall thickening. Lungs/Pleura: Low-attenuation fluid/debris within the left mainstem bronchus and throughout the left lung which is entirely opacified/collapsed. There is a dominant low-attenuation collection in the left lower lobe with air-fluid level measuring 3.8 cm. This could represent a abscess or necrotic cavitary mass with communication with the bronchi. Diffuse low-attenuation of the left upper lobe likely due to pneumonia. No focal consolidation, pleural effusion, or pneumothorax in the right lung. There are multiple solid nodules in the right lung. For example 9 mm nodule in the medial right upper lobe (3/80); 1.0 cm nodule in the right lower lobe along the major fissure (3/112); 9 mm cavitary nodule in the right lower lobe (3/89; and right lower lobe subpleural nodule measuring 5 mm (3/116). Intermediate density fluid collection or nodule along the posterior left lower lobe is indeterminate and could represent loculated pleural fluid or subpleural mass (2/132). Low-density fluid along the right upper lobe may be a loculated pleural effusion or developing pneumonia (circa series 2/image 63). Upper Abdomen: Patchy ill-defined rounded heterogenous areas of low attenuation within the mid left kidney as seen on CT abdomen pelvis earlier today. Right adrenal 1.4 cm nodule is stable since CT chest 03/26/2020. Musculoskeletal: Remote left rib fractures. No acute fracture or destructive osseous lesion. IMPRESSION: Heterogenous low-attenuation lobulated mass about the left hilum is indeterminate and may represent a  pulmonary abscess versus necrotic mass. Within the left mid lung there is a 3.8 cm cavitary lesion with air-fluid level either due to abscess or necrotic mass with communication with the tracheobronchial tree. Low-attenuation fluid/debris diffusely fills the left lung bronchi including the left mainstem bronchus. Complete opacification of the left lung with low-attenuation fluid/debris in the left upper lobe likely due to pneumonia. Intermediate density fluid collection or nodule along the posterior left lower lobe is indeterminate and could represent loculated pleural fluid or subpleural mass. Thickening and hyperenhancement of the visceral and parietal pericardium with central low attenuation fluid compatible with pericarditis. Multiple nodules measuring up to 10 mm in the right lung. Differential consideration includes septic emboli or metastases. Unchanged right hilar lymphadenopathy and new lower paraesophageal lymphadenopathy are indeterminate. Continued attention on follow-up. Question circumferential lower esophageal wall thickening. Underlying neoplasm  is difficult to exclude. Left presumed pyelonephritis. See report from CT abdomen and pelvis earlier today for details. Electronically Signed   By: Placido Sou M.D.   On: 07/15/2022 19:21   CT ABDOMEN PELVIS W CONTRAST  Result Date: 07/15/2022 CLINICAL DATA:  Acute abdominal pain EXAM: CT ABDOMEN AND PELVIS WITH CONTRAST TECHNIQUE: Multidetector CT imaging of the abdomen and pelvis was performed using the standard protocol following bolus administration of intravenous contrast. RADIATION DOSE REDUCTION: This exam was performed according to the departmental dose-optimization program which includes automated exposure control, adjustment of the mA and/or kV according to patient size and/or use of iterative reconstruction technique. CONTRAST:  181mL OMNIPAQUE IOHEXOL 300 MG/ML  SOLN COMPARISON:  Chest x-ray same day.  CT chest 04/04/2020. FINDINGS: Lower  chest: Consolidated lung is seen throughout the lower left hemithorax with volume loss. Hepatobiliary: No focal liver abnormality is seen. No gallstones, gallbladder wall thickening, or biliary dilatation. Pancreas: Unremarkable. No pancreatic ductal dilatation or surrounding inflammatory changes. Spleen: Normal in size without focal abnormality. Adrenals/Urinary Tract: Patchy ill-defined rounded heterogeneous areas of hypodensity are seen in the left mid kidney. The largest area measures proximally 4 cm. There is mild surrounding inflammatory stranding. There is no hydronephrosis or perinephric fluid collection. Ill-defined patchy area of hypodensity in the right kidney is small in size. Adrenal glands and bladder are within normal limits. Stomach/Bowel: Stomach is within normal limits. Appendix is not seen. No evidence of bowel wall thickening, distention, or inflammatory changes. There is a large amount of stool throughout the colon. Vascular/Lymphatic: Aortic atherosclerosis. No enlarged abdominal or pelvic lymph nodes. Reproductive: Prostate is unremarkable. Other: No abdominal wall hernia or abnormality. No abdominopelvic ascites. Musculoskeletal: No acute or significant osseous findings. IMPRESSION: 1. Patchy ill-defined areas of hypodensity in the left kidney with mild surrounding inflammatory stranding. Findings are concerning for pyelonephritis. Early renal abscesses are not excluded. 2. Patchy area of hypodensity in the right kidney is small in size and may represent focal pyelonephritis. 3. Consolidated lung in the lower left hemithorax with volume loss. Findings may represent atelectasis or infection. Underlying malignancy not excluded 4. Large amount of stool throughout the colon. Aortic Atherosclerosis (ICD10-I70.0). Electronically Signed   By: Ronney Asters M.D.   On: 07/15/2022 18:35   DG Chest 2 View  Result Date: 07/15/2022 CLINICAL DATA:  Weakness and cough EXAM: CHEST - 2 VIEW COMPARISON:   Chest CT dated March 26, 2020 FINDINGS: Cardiac and mediastinal contours are not visualized. New complete opacification of the left hemithorax. Leftward deviation of the trachea suggests findings are in part due to atelectasis and less likely due to large pleural effusion. Right lung is clear. Old left-sided rib fractures. IMPRESSION: New complete opacification of the left hemithorax. Leftward deviation of the trachea suggests findings are in part due to atelectasis and less likely due to large pleural effusion. Recommend contrast-enhanced chest CT to assess for underlying malignancy. Electronically Signed   By: Yetta Glassman M.D.   On: 07/15/2022 18:09               LOS: 2 days   Zoraya Fiorenza  [facility]   Pager on [email or web address]. If 7PM-7AM, please contact night-coverage at [email or web address]     07/17/2022, 12:19 PM

## 2022-07-17 NOTE — Progress Notes (Signed)
  Transition of Care [facility]) Screening Note   Patient Details  Name: Kenneth [name]. Date of Birth: 12-Oct-1960   Transition of Care [facility]) CM/SW Contact:    Quin Hoop, LCSW Phone Number: 07/17/2022, 2:48 PM    Transition of Care Department [facility]) has reviewed patient and no TOC needs have been identified at this time. We will continue to monitor patient advancement through interdisciplinary progression rounds. If new patient transition needs arise, please place a TOC consult.

## 2022-07-18 ENCOUNTER — Inpatient Hospital Stay: Payer: Medicare HMO

## 2022-07-18 DIAGNOSIS — J189 Pneumonia, unspecified organism: Secondary | ICD-10-CM | POA: Diagnosis not present

## 2022-07-18 DIAGNOSIS — J984 Other disorders of lung: Secondary | ICD-10-CM | POA: Diagnosis not present

## 2022-07-18 DIAGNOSIS — F203 Undifferentiated schizophrenia: Secondary | ICD-10-CM | POA: Diagnosis not present

## 2022-07-18 DIAGNOSIS — A419 Sepsis, unspecified organism: Secondary | ICD-10-CM | POA: Diagnosis not present

## 2022-07-18 LAB — CBC WITH DIFFERENTIAL/PLATELET
Abs Immature Granulocytes: 0.09 10*3/uL — ABNORMAL HIGH (ref 0.00–0.07)
Basophils Absolute: 0.1 10*3/uL (ref 0.0–0.1)
Basophils Relative: 1 %
Eosinophils Absolute: 0.3 10*3/uL (ref 0.0–0.5)
Eosinophils Relative: 2 %
HCT: 29.7 % — ABNORMAL LOW (ref 39.0–52.0)
Hemoglobin: 9.5 g/dL — ABNORMAL LOW (ref 13.0–17.0)
Immature Granulocytes: 1 %
Lymphocytes Relative: 8 %
Lymphs Abs: 1.4 10*3/uL (ref 0.7–4.0)
MCH: 26.1 pg (ref 26.0–34.0)
MCHC: 32 g/dL (ref 30.0–36.0)
MCV: 81.6 fL (ref 80.0–100.0)
Monocytes Absolute: 1.4 10*3/uL — ABNORMAL HIGH (ref 0.1–1.0)
Monocytes Relative: 8 %
Neutro Abs: 14.1 10*3/uL — ABNORMAL HIGH (ref 1.7–7.7)
Neutrophils Relative %: 80 %
Platelets: 574 10*3/uL — ABNORMAL HIGH (ref 150–400)
RBC: 3.64 MIL/uL — ABNORMAL LOW (ref 4.22–5.81)
RDW: 15.4 % (ref 11.5–15.5)
WBC: 17.4 10*3/uL — ABNORMAL HIGH (ref 4.0–10.5)
nRBC: 0 % (ref 0.0–0.2)

## 2022-07-18 MED ORDER — MINERAL OIL RE ENEM
1.0000 | ENEMA | Freq: Once | RECTAL | Status: AC
  Administered 2022-07-18: 1 via RECTAL

## 2022-07-18 MED ORDER — SORBITOL 70 % SOLN
960.0000 mL | TOPICAL_OIL | Freq: Once | ORAL | Status: DC
  Filled 2022-07-18: qty 240

## 2022-07-18 NOTE — Progress Notes (Addendum)
Progress Note    Kenneth [name].  DJM:[id number] DOB: 1960/07/08  DOA: 07/15/2022 PCP: Pcp, No      Brief Narrative:    Medical records reviewed and are as summarized below:  Kenneth [name]. is a 62 y.o. male with medical history significant for Paranoid schizophrenia, hypertension, diagnosed with iron deficiency anemia in November 2023 for which he received Venofer x 1 by oncology with recommendation for further treatment and colonoscopy but subsequently lost to follow-up.  He presented to the emergency department (from the group home) with auditory hallucinations.  He is on Invega injections for schizophrenia.  In the emergency department reviewed sepsis, pneumonia, cavitary lesion suspicious for abscess versus necrotic mass.  He was also found to have esophageal wall thickening and pericarditis on CT chest.  He was treated with IV fluids and empiric IV antibiotics.        Assessment/Plan:   Principal Problem:   Schizophrenia, undifferentiated ([name]) Active Problems:   Sepsis ([employer])   Septic embolism ([name])   Cavitating mass of lung/ lung abscess   Collapse of left lung   Esophageal abnormality   Pericarditis   Anemia   Underweight   Auditory hallucinations   Tobacco use disorder   Paranoid schizophrenia ([place])   Bipolar 1 disorder ([name])   Hypertension   Pneumonia of left lung due to infectious organism   Chronic obstructive lung disease ([name])   Abnormal CT scan, esophagus   Protein-calorie malnutrition, severe    Body mass index is 18.99 kg/m.   Sepsis, left hilar mass (pulmonary abscess versus necrotic mass), left midlung cavitary lesion (abscess versus necrotic mass), right upper lobe density (loculated pleural effusion or developing pneumonia), right lung pulmonary nodules (?  Septic emboli), leukocytosis: AFB and QuantiFERON are pending.  Continue IV Unasyn.  Follow-up with ID and pulmonologist.  No growth on blood cultures thus far. . Suspected  left pyelonephritis or early renal abscess noted on CT abdomen and pelvis but urinalysis was unremarkable and patient has no urinary symptoms. Obtain renal ultrasound for further evaluation   Esophageal wall thickening: Plan for EGD later in the course of hospitalization.  Follow-up with gastroenterologist.     Pericarditis on CT chest: No chest pain.  No evidence of pericardial effusion on CT chest.   Schizophrenia, bipolar disorder, auditory hallucinations: Continue hydroxyzine, paliperidone and paroxetine.   Other comorbidities include tobacco use disorder, chronic anemia, hypertension, memory loss   Diet Order             Diet regular Fluid consistency: Thin  Diet effective now                            Consultants: Gastroenterologist ID specialist Pulmonologist Psychiatrist  Procedures: None    Medications:    atorvastatin  20 mg Oral Daily   enoxaparin (LOVENOX) injection  40 mg Subcutaneous Q24H   feeding supplement  237 mL Oral TID BM   fluticasone furoate-vilanterol  1 puff Inhalation Daily   And   umeclidinium bromide  1 puff Inhalation Daily   hydrOXYzine  25 mg Oral TID   montelukast  10 mg Oral Daily   multivitamin with minerals  1 tablet Oral Daily   oxybutynin  10 mg Oral Daily   paliperidone  3 mg Oral Daily   PARoxetine  20 mg Oral Daily   polyethylene glycol  17 g Oral Daily   Continuous Infusions:  ampicillin-sulbactam (UNASYN) IV 3 g (07/18/22 0540)     Anti-infectives (From admission, onward)    Start     Dose/Rate Route Frequency Ordered Stop   07/16/22 2000  azithromycin (ZITHROMAX) 500 mg in sodium chloride 0.9 % 250 mL IVPB  Status:  Discontinued        500 mg 250 mL/hr over 60 Minutes Intravenous Every 24 hours 07/15/22 2119 07/16/22 1039   07/16/22 1200  vancomycin (VANCOREADY) IVPB 750 mg/150 mL  Status:  Discontinued        750 mg 150 mL/hr over 60 Minutes Intravenous Every 12 hours 07/15/22 2144 07/16/22 1613    07/16/22 1200  Ampicillin-Sulbactam (UNASYN) 3 g in sodium chloride 0.9 % 100 mL IVPB        3 g 200 mL/hr over 30 Minutes Intravenous Every 6 hours 07/16/22 1039     07/16/22 0400  ceFEPIme (MAXIPIME) 2 g in sodium chloride 0.9 % 100 mL IVPB  Status:  Discontinued        2 g 200 mL/hr over 30 Minutes Intravenous Every 8 hours 07/15/22 2144 07/16/22 1039   07/16/22 0000  azithromycin (ZITHROMAX) 500 mg in sodium chloride 0.9 % 250 mL IVPB  Status:  Discontinued        500 mg 250 mL/hr over 60 Minutes Intravenous Every 24 hours 07/15/22 2105 07/15/22 2119   07/15/22 2145  vancomycin (VANCOREADY) IVPB 500 mg/100 mL        500 mg 100 mL/hr over 60 Minutes Intravenous  Once 07/15/22 2144 07/16/22 0830   07/15/22 1915  vancomycin (VANCOCIN) IVPB 1000 mg/200 mL premix        1,000 mg 200 mL/hr over 60 Minutes Intravenous  Once 07/15/22 1900 07/15/22 2143   07/15/22 1915  ceFEPIme (MAXIPIME) 2 g in sodium chloride 0.9 % 100 mL IVPB        2 g 200 mL/hr over 30 Minutes Intravenous  Once 07/15/22 1900 07/15/22 2005   07/15/22 1915  azithromycin (ZITHROMAX) 500 mg in sodium chloride 0.9 % 250 mL IVPB        500 mg 250 mL/hr over 60 Minutes Intravenous  Once 07/15/22 1900 07/15/22 2143              Family Communication/Anticipated D/C date and plan/Code Status   DVT prophylaxis: enoxaparin (LOVENOX) injection 40 mg Start: 07/16/22 2200 SCDs Start: 07/15/22 2104     Code Status: Full Code  Family Communication: None Disposition Plan: Plan to discharge to group home when medically stable.   Status is: Inpatient Remains inpatient appropriate because: IV antibiotics for pneumonia       Subjective:   He complains of cough.  No chest pain or shortness of breath.  No hallucinations.  Objective:    Vitals:   07/17/22 2335 07/18/22 0827 07/18/22 1030 07/18/22 1109  BP: 109/65 99/71 110/67 112/73  Pulse:  (!) 104 (!) 109 (!) 106  Resp:  (!) 21 18 (!) 28  Temp: 98.7 F  (37.1 C) 97.8 F (36.6 C) 98.5 F (36.9 C) 98.3 F (36.8 C)  TempSrc:   Oral   SpO2: 98% 96% 97% 96%  Weight:      Height:       No data found.   Intake/Output Summary (Last 24 hours) at 07/18/2022 1137 Last data filed at 07/18/2022 0828 Gross per 24 hour  Intake --  Output 2150 ml  Net -2150 ml   Filed Weights   07/15/22 1920  Weight: 63.5  kg    Exam:  GEN: NAD SKIN: Warm and dry EYES: EOMI ENT: MMM CV: RRR PULM: Mild rhonchi at the bases ABD: soft, ND, NT, +BS CNS: AAO x 4, non focal EXT: No edema or tenderness PSYCH: Calm, cooperative     Data Reviewed:   I have personally reviewed following labs and imaging studies:  Labs: Labs show the following:   Basic Metabolic Panel: Recent Labs  Lab 07/15/22 1519 07/16/22 0331 07/17/22 0435  NA 130* 136 134*  K 3.8 3.7 3.6  CL 94* 104 101  CO2 28 28 27   GLUCOSE 123* 124* 103*  BUN 11 14 10   CREATININE 0.77 0.90 0.66  CALCIUM 9.6 8.9 8.9  MG  --   --  1.8  PHOS  --   --  2.7   GFR Estimated Creatinine Clearance: 88.2 mL/min (by C-G formula based on SCr of 0.66 mg/dL). Liver Function Tests: Recent Labs  Lab 07/15/22 1519 07/16/22 0331  AST 24 23  ALT 44 36  ALKPHOS 110 94  BILITOT 0.7 0.5  PROT 7.2 6.1*  ALBUMIN 2.1* 1.8*   No results for input(s): "LIPASE", "AMYLASE" in the last 168 hours. No results for input(s): "AMMONIA" in the last 168 hours. Coagulation profile Recent Labs  Lab 07/16/22 0331  INR 1.3*    CBC: Recent Labs  Lab 07/15/22 1519 07/16/22 0331 07/17/22 0435 07/18/22 0640  WBC 27.4* 15.4* 18.0* 17.4*  NEUTROABS  --   --  14.6* 14.1*  HGB 10.1* 8.9* 9.1* 9.5*  HCT 31.3* 28.0* 28.3* 29.7*  MCV 80.5 81.9 80.6 81.6  PLT 610* 544* 546* 574*   Cardiac Enzymes: No results for input(s): "CKTOTAL", "CKMB", "CKMBINDEX", "TROPONINI" in the last 168 hours. BNP (last 3 results) No results for input(s): "PROBNP" in the last 8760 hours. CBG: Recent Labs  Lab 07/17/22 0420   GLUCAP 132*   D-Dimer: No results for input(s): "DDIMER" in the last 72 hours. Hgb A1c: No results for input(s): "HGBA1C" in the last 72 hours. Lipid Profile: No results for input(s): "CHOL", "HDL", "LDLCALC", "TRIG", "CHOLHDL", "LDLDIRECT" in the last 72 hours. Thyroid function studies: No results for input(s): "TSH", "T4TOTAL", "T3FREE", "THYROIDAB" in the last 72 hours.  Invalid input(s): "FREET3" Anemia work up: No results for input(s): "VITAMINB12", "FOLATE", "FERRITIN", "TIBC", "IRON", "RETICCTPCT" in the last 72 hours. Sepsis Labs: Recent Labs  Lab 07/15/22 1519 07/15/22 1915 07/15/22 2050 07/16/22 0331 07/17/22 0435 07/18/22 0640  PROCALCITON  --   --   --  0.12  --   --   WBC 27.4*  --   --  15.4* 18.0* 17.4*  LATICACIDVEN  --  1.3 1.7  --   --   --     Microbiology Recent Results (from the past 240 hour(s))  Resp panel by RT-PCR (RSV, Flu A&B, Covid) Anterior Nasal Swab     Status: None   Collection Time: 07/15/22  6:05 PM   Specimen: Anterior Nasal Swab  Result Value Ref Range Status   SARS Coronavirus 2 by RT PCR NEGATIVE NEGATIVE Final    Comment: (NOTE) SARS-CoV-2 target nucleic acids are NOT DETECTED.  The SARS-CoV-2 RNA is generally detectable in upper respiratory specimens during the acute phase of infection. The lowest concentration of SARS-CoV-2 viral copies this assay can detect is 138 copies/mL. A negative result does not preclude SARS-Cov-2 infection and should not be used as the sole basis for treatment or other patient management decisions. A negative result may occur with  improper specimen collection/handling, submission of specimen other than nasopharyngeal swab, presence of viral mutation(s) within the areas targeted by this assay, and inadequate number of viral copies(<138 copies/mL). A negative result must be combined with clinical observations, patient history, and epidemiological information. The expected result is Negative.  Fact  Sheet for Patients:  EntrepreneurPulse.com.au  Fact Sheet for Healthcare Providers:  IncredibleEmployment.be  This test is no t yet approved or cleared by the [place] FDA and  has been authorized for detection and/or diagnosis of SARS-CoV-2 by FDA under an Emergency Use Authorization (EUA). This EUA will remain  in effect (meaning this test can be used) for the duration of the COVID-19 declaration under Section 564(b)(1) of the Act, 21 U.S.C.section 360bbb-3(b)(1), unless the authorization is terminated  or revoked sooner.       Influenza A by PCR NEGATIVE NEGATIVE Final   Influenza B by PCR NEGATIVE NEGATIVE Final    Comment: (NOTE) The Xpert Xpress SARS-CoV-2/FLU/RSV plus assay is intended as an aid in the diagnosis of influenza from Nasopharyngeal swab specimens and should not be used as a sole basis for treatment. Nasal washings and aspirates are unacceptable for Xpert Xpress SARS-CoV-2/FLU/RSV testing.  Fact Sheet for Patients: EntrepreneurPulse.com.au  Fact Sheet for Healthcare Providers: IncredibleEmployment.be  This test is not yet approved or cleared by the [place] FDA and has been authorized for detection and/or diagnosis of SARS-CoV-2 by FDA under an Emergency Use Authorization (EUA). This EUA will remain in effect (meaning this test can be used) for the duration of the COVID-19 declaration under Section 564(b)(1) of the Act, 21 U.S.C. section 360bbb-3(b)(1), unless the authorization is terminated or revoked.     Resp Syncytial Virus by PCR NEGATIVE NEGATIVE Final    Comment: (NOTE) Fact Sheet for Patients: EntrepreneurPulse.com.au  Fact Sheet for Healthcare Providers: IncredibleEmployment.be  This test is not yet approved or cleared by the [place] FDA and has been authorized for detection and/or diagnosis of SARS-CoV-2 by FDA under an  Emergency Use Authorization (EUA). This EUA will remain in effect (meaning this test can be used) for the duration of the COVID-19 declaration under Section 564(b)(1) of the Act, 21 U.S.C. section 360bbb-3(b)(1), unless the authorization is terminated or revoked.  Performed at [facility], [address] [place], [place] [address]   Blood Culture (routine x 2)     Status: None (Preliminary result)   Collection Time: 07/15/22  7:15 PM   Specimen: BLOOD  Result Value Ref Range Status   Specimen Description BLOOD LEFT [facility]  Final   Special Requests   Final    BOTTLES DRAWN AEROBIC AND ANAEROBIC Blood Culture adequate volume   Culture   Final    NO GROWTH 3 DAYS Performed at [facility], [address], [place], [place] [address]    Report Status PENDING  Incomplete  Blood Culture (routine x 2)     Status: None (Preliminary result)   Collection Time: 07/15/22  7:15 PM   Specimen: BLOOD  Result Value Ref Range Status   Specimen Description BLOOD RIGHT [facility]  Final   Special Requests   Final    BOTTLES DRAWN AEROBIC AND ANAEROBIC Blood Culture adequate volume   Culture   Final    NO GROWTH 3 DAYS Performed at [facility], [address], [place], [place] [address]    Report Status PENDING  Incomplete  MRSA Next Gen by PCR, Nasal     Status: None   Collection Time:  07/16/22  9:50 AM   Specimen: Nasal Mucosa; Nasal Swab  Result Value Ref Range Status   MRSA by PCR Next Gen NOT DETECTED NOT DETECTED Final    Comment: (NOTE) The GeneXpert MRSA Assay (FDA approved for NASAL specimens only), is one component of a comprehensive MRSA colonization surveillance program. It is not intended to diagnose MRSA infection nor to guide or monitor treatment for MRSA infections. Test performance is not FDA approved in patients less than 39 years old. Performed at [facility], [address] [place], [place] [address]     Procedures and diagnostic  studies:  No results found.             LOS: 3 days   Nature Vogelsang  [facility]   Pager on [email or web address]. If 7PM-7AM, please contact night-coverage at [email or web address]     07/18/2022, 11:37 AM

## 2022-07-18 NOTE — Consult Note (Signed)
PULMONOLOGY         Date: 07/18/2022,   MRN# [id number] Kenneth [name]. Nov 18, 1960     AdmissionWeight: 63.5 kg                 CurrentWeight: 63.5 kg  Referring provider: Dr. Mal Misty   CHIEF COMPLAINT:   Left lung mass   HISTORY OF PRESENT ILLNESS   This is a pleasant 62 year old male with a history of psychiatric disorder and memory loss essential hypertension iron deficiency anemia, who came in from outpatient oncology evaluation due to ongoing progressive anemia.  Patient reported hallucinations also reports cough denies shortness of breath had a CT chest performed which shows left lung mass.  He denies chest pain or discomfort.  He denies SOB.  He shares his grandfather had colon cancer.    He smokes 3 cigs a day and he started smoking at age 36, he used to smoke 1 pack a day.    PCCM consultation for additional evaluation management.   07/18/22- patient seen at bedside this am.  He is on room air with no SOB/Dyspnea.  No resp  cultures done yet.  MRSA screen and procalcitonin trend normal.  Patient is paranoid and delusional.  He requested that I remove all the pork out of room before I can speak with him. He is doing well from resp perspective.  I will continue to monitor him peripherally and will do most of the cancer workup on outpatient.    PAST MEDICAL HISTORY   Past Medical History:  Diagnosis Date   Bipolar 1 disorder ([place])    Hypertension    Memory loss    Paranoid schizophrenia ([place])      SURGICAL HISTORY   History reviewed. No pertinent surgical history.   FAMILY HISTORY   Family History  Problem Relation Age of Onset   Diabetes Mother    Cancer Mother    Cancer Maternal Grandfather      SOCIAL HISTORY   Social History   Tobacco Use   Smoking status: Every Day    Types: Cigarettes   Smokeless tobacco: Never  Substance Use Topics   Alcohol use: Not Currently   Drug use: Never     MEDICATIONS    Home Medication:     Current Medication:  Current Facility-Administered Medications:    acetaminophen (TYLENOL) tablet 650 mg, 650 mg, Oral, Q6H PRN **OR** acetaminophen (TYLENOL) suppository 650 mg, 650 mg, Rectal, Q6H PRN, Athena Masse, MD   albuterol (PROVENTIL) (2.5 MG/3ML) 0.083% nebulizer solution 2.5 mg, 2.5 mg, Nebulization, Q6H PRN, Athena Masse, MD   Ampicillin-Sulbactam (UNASYN) 3 g in sodium chloride 0.9 % 100 mL IVPB, 3 g, Intravenous, Q6H, Mignon Pine, DO, Last Rate: 200 mL/hr at 07/18/22 0540, 3 g at 07/18/22 0540   atorvastatin (LIPITOR) tablet 20 mg, 20 mg, Oral, Daily, Judd Gaudier V, MD, 20 mg at 07/18/22 0853   enoxaparin (LOVENOX) injection 40 mg, 40 mg, Subcutaneous, Q24H, Jennye Boroughs, MD, 40 mg at 07/17/22 2109   feeding supplement (ENSURE ENLIVE / ENSURE PLUS) liquid 237 mL, 237 mL, Oral, TID BM, Jennye Boroughs, MD, 237 mL at 07/18/22 0855   fluticasone furoate-vilanterol (BREO ELLIPTA) 200-25 MCG/ACT 1 puff, 1 puff, Inhalation, Daily, 1 puff at 07/18/22 0857 **AND** umeclidinium bromide (INCRUSE ELLIPTA) 62.5 MCG/ACT 1 puff, 1 puff, Inhalation, Daily, Judd Gaudier V, MD, 1 puff at 07/18/22 0857   HYDROcodone-acetaminophen (NORCO/VICODIN) 5-325 MG per tablet 1-2 tablet, 1-2  tablet, Oral, Q4H PRN, Athena Masse, MD, 1 tablet at 07/15/22 2303   hydrOXYzine (ATARAX) tablet 25 mg, 25 mg, Oral, TID, Athena Masse, MD, 25 mg at 07/18/22 0853   montelukast (SINGULAIR) tablet 10 mg, 10 mg, Oral, Daily, Judd Gaudier V, MD, 10 mg at 07/18/22 8101   multivitamin with minerals tablet 1 tablet, 1 tablet, Oral, Daily, Jennye Boroughs, MD, 1 tablet at 07/18/22 0853   ondansetron (ZOFRAN) tablet 4 mg, 4 mg, Oral, Q6H PRN **OR** ondansetron (ZOFRAN) injection 4 mg, 4 mg, Intravenous, Q6H PRN, Athena Masse, MD   oxybutynin (DITROPAN-XL) 24 hr tablet 10 mg, 10 mg, Oral, Daily, Judd Gaudier V, MD, 10 mg at 07/18/22 0854   paliperidone (INVEGA) 24 hr tablet 3 mg, 3 mg, Oral, Daily, Bennett,  Christal H, NP, 3 mg at 07/18/22 0854   PARoxetine (PAXIL) tablet 20 mg, 20 mg, Oral, Daily, Judd Gaudier V, MD, 20 mg at 07/18/22 0854   polyethylene glycol (MIRALAX / GLYCOLAX) packet 17 g, 17 g, Oral, Daily, Jennye Boroughs, MD, 17 g at 07/18/22 0855   senna-docusate (Senokot-S) tablet 1 tablet, 1 tablet, Oral, QHS PRN, Jennye Boroughs, MD, 1 tablet at 07/18/22 7510    ALLERGIES   Patient has no known allergies.     REVIEW OF SYSTEMS    Review of Systems:  Gen:  Denies  fever, sweats, chills weigh loss  HEENT: Denies blurred vision, double vision, ear pain, eye pain, hearing loss, nose bleeds, sore throat Cardiac:  No dizziness, chest pain or heaviness, chest tightness,edema Resp:   reports dyspnea chronically  Gi: Denies swallowing difficulty, stomach pain, nausea or vomiting, diarrhea, constipation, bowel incontinence Gu:  Denies bladder incontinence, burning urine Ext:   Denies Joint pain, stiffness or swelling Skin: Denies  skin rash, easy bruising or bleeding or hives Endoc:  Denies polyuria, polydipsia , polyphagia or weight change Psych:   Denies depression, insomnia or hallucinations   Other:  All other systems negative   VS: BP 99/71 (BP Location: Left Arm)   Pulse (!) 104   Temp 97.8 F (36.6 C)   Resp (!) 21   Ht 6' (1.829 m)   Wt 63.5 kg   SpO2 96%   BMI 18.99 kg/m      PHYSICAL EXAM    GENERAL:NAD, no fevers, chills, no weakness no fatigue HEAD: Normocephalic, atraumatic.  EYES: Pupils equal, round, reactive to light. Extraocular muscles intact. No scleral icterus.  MOUTH: Moist mucosal membrane. Dentition intact. No abscess noted.  EAR, NOSE, THROAT: Clear without exudates. No external lesions.  NECK: Supple. No thyromegaly. No nodules. No JVD.  PULMONARY: decreased breath sounds with mild rhonchi worse at bases bilaterally.  CARDIOVASCULAR: S1 and S2. Regular rate and rhythm. No murmurs, rubs, or gallops. No edema. Pedal pulses 2+ bilaterally.   GASTROINTESTINAL: Soft, nontender, nondistended. No masses. Positive bowel sounds. No hepatosplenomegaly.  MUSCULOSKELETAL: No swelling, clubbing, or edema. Range of motion full in all extremities.  NEUROLOGIC: Cranial nerves II through XII are intact. No gross focal neurological deficits. Sensation intact. Reflexes intact.  SKIN: No ulceration, lesions, rashes, or cyanosis. Skin warm and dry. Turgor intact.  PSYCHIATRIC: Mood, affect within normal limits. The patient is awake, alert and oriented x 3. Insight, judgment intact.       IMAGING     ASSESSMENT/PLAN   Left lung mass   Patient interested in diagnosis and treatment -we will likely need to set up PET scan on outpatient and biopsies based  on this study - Oncology on case appreciate input -Patient shares he wants everything done.          Thank you for allowing me to participate in the care of this patient.   Patient/Family are satisfied with care plan and all questions have been answered.    Provider disclosure: Patient with at least one acute or chronic illness or injury that poses a threat to life or bodily function and is being managed actively during this encounter.  All of the below services have been performed independently by signing provider:  review of prior documentation from internal and or external health records.  Review of previous and current lab results.  Interview and comprehensive assessment during patient visit today. Review of current and previous chest radiographs/CT scans. Discussion of management and test interpretation with health care team and patient/family.   This document was prepared using Dragon voice recognition software and may include unintentional dictation errors.     Ottie Glazier, M.D.  Division of Pulmonary & Critical Care Medicine

## 2022-07-18 NOTE — Progress Notes (Signed)
   07/18/22 0827  Assess: MEWS Score  Temp 97.8 F (36.6 C)  BP 99/71  MAP (mmHg) 81  Pulse Rate (!) 104  Resp (!) 21  SpO2 96 %  Assess: MEWS Score  MEWS Temp 0  MEWS Systolic 1  MEWS Pulse 1  MEWS RR 1  MEWS LOC 0  MEWS Score 3  MEWS Score Color Yellow  Assess: if the MEWS score is Yellow or Red  Were vital signs taken at a resting state? Yes  Focused Assessment No change from prior assessment  Does the patient meet 2 or more of the SIRS criteria? No  MEWS guidelines implemented *See Row Information* Yes  Treat  Pain Scale 0-10  Pain Score 0  Take Vital Signs  Increase Vital Sign Frequency  Yellow: Q 2hr X 2 then Q 4hr X 2, if remains yellow, continue Q 4hrs  Escalate  MEWS: Escalate Yellow: discuss with charge nurse/RN and consider discussing with provider and RRT  Notify: Charge Nurse/RN  Name of Charge Nurse/RN Notified robyn, rn  Date Charge Nurse/RN Notified 07/18/22  Time Charge Nurse/RN Notified 0830  Provider Notification  Provider Name/Title ayiku  Date Provider Notified 07/18/22  Time Provider Notified [phone]  Method of Notification  (secure chat)  Notification Reason Other (Comment) (yellow mews)  Provider response No new orders  Date of Provider Response 07/18/22  Time of Provider Response [id number]  Assess: SIRS CRITERIA  SIRS Temperature  0  SIRS Pulse 1  SIRS Respirations  1  SIRS WBC 0  SIRS Score Sum  2

## 2022-07-19 DIAGNOSIS — A419 Sepsis, unspecified organism: Secondary | ICD-10-CM | POA: Diagnosis not present

## 2022-07-19 DIAGNOSIS — F203 Undifferentiated schizophrenia: Secondary | ICD-10-CM | POA: Diagnosis not present

## 2022-07-19 DIAGNOSIS — J984 Other disorders of lung: Secondary | ICD-10-CM | POA: Diagnosis not present

## 2022-07-19 LAB — CBC WITH DIFFERENTIAL/PLATELET
Abs Immature Granulocytes: 0.08 10*3/uL — ABNORMAL HIGH (ref 0.00–0.07)
Basophils Absolute: 0.1 10*3/uL (ref 0.0–0.1)
Basophils Relative: 1 %
Eosinophils Absolute: 0.3 10*3/uL (ref 0.0–0.5)
Eosinophils Relative: 2 %
HCT: 31.8 % — ABNORMAL LOW (ref 39.0–52.0)
Hemoglobin: 9.8 g/dL — ABNORMAL LOW (ref 13.0–17.0)
Immature Granulocytes: 1 %
Lymphocytes Relative: 13 %
Lymphs Abs: 1.6 10*3/uL (ref 0.7–4.0)
MCH: 25.5 pg — ABNORMAL LOW (ref 26.0–34.0)
MCHC: 30.8 g/dL (ref 30.0–36.0)
MCV: 82.6 fL (ref 80.0–100.0)
Monocytes Absolute: 1 10*3/uL (ref 0.1–1.0)
Monocytes Relative: 8 %
Neutro Abs: 9.5 10*3/uL — ABNORMAL HIGH (ref 1.7–7.7)
Neutrophils Relative %: 75 %
Platelets: 630 10*3/uL — ABNORMAL HIGH (ref 150–400)
RBC: 3.85 MIL/uL — ABNORMAL LOW (ref 4.22–5.81)
RDW: 15.6 % — ABNORMAL HIGH (ref 11.5–15.5)
WBC: 12.6 10*3/uL — ABNORMAL HIGH (ref 4.0–10.5)
nRBC: 0 % (ref 0.0–0.2)

## 2022-07-19 MED ORDER — BISACODYL 10 MG RE SUPP
10.0000 mg | Freq: Once | RECTAL | Status: AC
  Administered 2022-07-19: 10 mg via RECTAL
  Filled 2022-07-19: qty 1

## 2022-07-19 NOTE — Progress Notes (Addendum)
Progress Note    [name]  IBB:[id number] DOB: [birth year]  DOA: 07/15/2022 PCP: Pcp, No      Brief Narrative:    Medical records reviewed and are as summarized below:  [name] is a 62 y.o. male with medical history significant for Paranoid schizophrenia, hypertension, diagnosed with iron deficiency anemia in November 2023 for which he received Venofer x 1 by oncology with recommendation for further treatment and colonoscopy but subsequently lost to follow-up.  He presented to the emergency department (from the group home) with auditory hallucinations.  He is on Invega injections for schizophrenia.  In the emergency department reviewed sepsis, pneumonia, cavitary lesion suspicious for abscess versus necrotic mass.  He was also found to have esophageal wall thickening and pericarditis on CT chest.  He was treated with IV fluids and empiric IV antibiotics.        Assessment/Plan:   Principal Problem:   Schizophrenia, undifferentiated ([name]) Active Problems:   Sepsis ([place])   Septic embolism ([name])   Cavitating mass of lung/ lung abscess   Collapse of left lung   Esophageal abnormality   Pericarditis   Anemia   Underweight   Auditory hallucinations   Tobacco use disorder   Paranoid schizophrenia ([place])   Bipolar 1 disorder ([name])   Hypertension   Pneumonia of left lung due to infectious organism   Chronic obstructive lung disease ([name])   Abnormal CT scan, esophagus   Protein-calorie malnutrition, severe    Body mass index is 18.99 kg/m.   Sepsis, left hilar mass (pulmonary abscess versus necrotic mass), left midlung cavitary lesion (abscess versus necrotic mass), right upper lobe density (loculated pleural effusion or developing pneumonia), right lung pulmonary nodules (?  Septic emboli), leukocytosis: AFB and QuantiFERON are pending.  No growth on blood cultures.  Continue IV Unasyn.  Follow-up with ID and pulmonologist. Suspected left  pyelonephritis or early renal abscess noted on CT abdomen and pelvis but urinalysis was unremarkable and patient has no urinary symptoms. Renal ultrasound showed findings concerning for left pyelonephritis.  However, there is no clinical evidence of features suggestive of pyelonephritis at this time.   Esophageal wall thickening: Plan for EGD later in the course of hospitalization.  Follow-up with gastroenterologist.     Pericarditis on CT chest: No chest pain.  No evidence of pericardial effusion on CT chest.   Schizophrenia, bipolar disorder, auditory hallucinations: Continue paliperidone, paroxetine and hydroxyzine.   Constipation: Continue MiraLAX and Senokot.  Add Dulcolax suppository.   Thrombocytosis: This is likely reactive  Other comorbidities include tobacco use disorder, chronic anemia, hypertension, memory loss   Diet Order             Diet regular Fluid consistency: Thin  Diet effective now                            Consultants: Gastroenterologist ID specialist Pulmonologist Psychiatrist  Procedures: None    Medications:    atorvastatin  20 mg Oral Daily   enoxaparin (LOVENOX) injection  40 mg Subcutaneous Q24H   feeding supplement  237 mL Oral TID BM   fluticasone furoate-vilanterol  1 puff Inhalation Daily   And   umeclidinium bromide  1 puff Inhalation Daily   hydrOXYzine  25 mg Oral TID   montelukast  10 mg Oral Daily   multivitamin with minerals  1 tablet Oral Daily   oxybutynin  10 mg Oral  Daily   paliperidone  3 mg Oral Daily   PARoxetine  20 mg Oral Daily   polyethylene glycol  17 g Oral Daily   sorbitol, milk of mag, mineral oil, glycerin (SMOG) enema  960 mL Rectal Once   Continuous Infusions:  ampicillin-sulbactam (UNASYN) IV 3 g (07/19/22 0263)     Anti-infectives (From admission, onward)    Start     Dose/Rate Route Frequency Ordered Stop   07/16/22 2000  azithromycin (ZITHROMAX) 500 mg in sodium chloride 0.9 %  250 mL IVPB  Status:  Discontinued        500 mg 250 mL/hr over 60 Minutes Intravenous Every 24 hours 07/15/22 2119 07/16/22 1039   07/16/22 1200  vancomycin (VANCOREADY) IVPB 750 mg/150 mL  Status:  Discontinued        750 mg 150 mL/hr over 60 Minutes Intravenous Every 12 hours 07/15/22 2144 07/16/22 1613   07/16/22 1200  Ampicillin-Sulbactam (UNASYN) 3 g in sodium chloride 0.9 % 100 mL IVPB        3 g 200 mL/hr over 30 Minutes Intravenous Every 6 hours 07/16/22 1039     07/16/22 0400  ceFEPIme (MAXIPIME) 2 g in sodium chloride 0.9 % 100 mL IVPB  Status:  Discontinued        2 g 200 mL/hr over 30 Minutes Intravenous Every 8 hours 07/15/22 2144 07/16/22 1039   07/16/22 0000  azithromycin (ZITHROMAX) 500 mg in sodium chloride 0.9 % 250 mL IVPB  Status:  Discontinued        500 mg 250 mL/hr over 60 Minutes Intravenous Every 24 hours 07/15/22 2105 07/15/22 2119   07/15/22 2145  vancomycin (VANCOREADY) IVPB 500 mg/100 mL        500 mg 100 mL/hr over 60 Minutes Intravenous  Once 07/15/22 2144 07/16/22 0830   07/15/22 1915  vancomycin (VANCOCIN) IVPB 1000 mg/200 mL premix        1,000 mg 200 mL/hr over 60 Minutes Intravenous  Once 07/15/22 1900 07/15/22 2143   07/15/22 1915  ceFEPIme (MAXIPIME) 2 g in sodium chloride 0.9 % 100 mL IVPB        2 g 200 mL/hr over 30 Minutes Intravenous  Once 07/15/22 1900 07/15/22 2005   07/15/22 1915  azithromycin (ZITHROMAX) 500 mg in sodium chloride 0.9 % 250 mL IVPB        500 mg 250 mL/hr over 60 Minutes Intravenous  Once 07/15/22 1900 07/15/22 2143              Family Communication/Anticipated D/C date and plan/Code Status   DVT prophylaxis: enoxaparin (LOVENOX) injection 40 mg Start: 07/16/22 2200 SCDs Start: 07/15/22 2104     Code Status: Full Code  Family Communication: None Disposition Plan: Plan to discharge to group home when medically stable.   Status is: Inpatient Remains inpatient appropriate because: IV antibiotics for  pneumonia       Subjective:   Interval events noted.  He has a cough productive of greenish sputum.  No chest pain or shortness of breath.  No abdominal pain, vomiting or urinary symptoms.  Objective:    Vitals:   07/18/22 1552 07/18/22 2030 07/19/22 0120 07/19/22 0535  BP: 109/72 102/70 104/68 92/66  Pulse: 99 100 98 100  Resp: (!) 21  18 18   Temp: 98.6 F (37 C) 99.1 F (37.3 C) 99.9 F (37.7 C) 98 F (36.7 C)  TempSrc:  Oral    SpO2: 98% 99% 95% 98%  Weight:  Height:       No data found.   Intake/Output Summary (Last 24 hours) at 07/19/2022 1008 Last data filed at 07/19/2022 0500 Gross per 24 hour  Intake --  Output 3600 ml  Net -3600 ml   Filed Weights   07/15/22 1920  Weight: 63.5 kg    Exam:  GEN: NAD SKIN: Warm and dry EYES: Anicteric ENT: MMM CV: RRR PULM: CTA B ABD: soft, ND, NT, +BS CNS: AAO x 3, non focal EXT: No edema or tenderness GU: No CVA tenderness   Data Reviewed:   I have personally reviewed following labs and imaging studies:  Labs: Labs show the following:   Basic Metabolic Panel: Recent Labs  Lab 07/15/22 1519 07/16/22 0331 07/17/22 0435  NA 130* 136 134*  K 3.8 3.7 3.6  CL 94* 104 101  CO2 28 28 27   GLUCOSE 123* 124* 103*  BUN 11 14 10   CREATININE 0.77 0.90 0.66  CALCIUM 9.6 8.9 8.9  MG  --   --  1.8  PHOS  --   --  2.7   GFR Estimated Creatinine Clearance: 88.2 mL/min (by C-G formula based on SCr of 0.66 mg/dL). Liver Function Tests: Recent Labs  Lab 07/15/22 1519 07/16/22 0331  AST 24 23  ALT 44 36  ALKPHOS 110 94  BILITOT 0.7 0.5  PROT 7.2 6.1*  ALBUMIN 2.1* 1.8*   No results for input(s): "LIPASE", "AMYLASE" in the last 168 hours. No results for input(s): "AMMONIA" in the last 168 hours. Coagulation profile Recent Labs  Lab 07/16/22 0331  INR 1.3*    CBC: Recent Labs  Lab 07/15/22 1519 07/16/22 0331 07/17/22 0435 07/18/22 0640 07/19/22 0521  WBC 27.4* 15.4* 18.0* 17.4* 12.6*   NEUTROABS  --   --  14.6* 14.1* 9.5*  HGB 10.1* 8.9* 9.1* 9.5* 9.8*  HCT 31.3* 28.0* 28.3* 29.7* 31.8*  MCV 80.5 81.9 80.6 81.6 82.6  PLT 610* 544* 546* 574* 630*   Cardiac Enzymes: No results for input(s): "CKTOTAL", "CKMB", "CKMBINDEX", "TROPONINI" in the last 168 hours. BNP (last 3 results) No results for input(s): "PROBNP" in the last 8760 hours. CBG: Recent Labs  Lab 07/17/22 0420  GLUCAP 132*   D-Dimer: No results for input(s): "DDIMER" in the last 72 hours. Hgb A1c: No results for input(s): "HGBA1C" in the last 72 hours. Lipid Profile: No results for input(s): "CHOL", "HDL", "LDLCALC", "TRIG", "CHOLHDL", "LDLDIRECT" in the last 72 hours. Thyroid function studies: No results for input(s): "TSH", "T4TOTAL", "T3FREE", "THYROIDAB" in the last 72 hours.  Invalid input(s): "FREET3" Anemia work up: No results for input(s): "VITAMINB12", "FOLATE", "FERRITIN", "TIBC", "IRON", "RETICCTPCT" in the last 72 hours. Sepsis Labs: Recent Labs  Lab 07/15/22 1915 07/15/22 2050 07/16/22 0331 07/17/22 0435 07/18/22 0640 07/19/22 0521  PROCALCITON  --   --  0.12  --   --   --   WBC  --   --  15.4* 18.0* 17.4* 12.6*  LATICACIDVEN 1.3 1.7  --   --   --   --     Microbiology Recent Results (from the past 240 hour(s))  Resp panel by RT-PCR (RSV, Flu A&B, Covid) Anterior Nasal Swab     Status: None   Collection Time: 07/15/22  6:05 PM   Specimen: Anterior Nasal Swab  Result Value Ref Range Status   SARS Coronavirus 2 by RT PCR NEGATIVE NEGATIVE Final    Comment: (NOTE) SARS-CoV-2 target nucleic acids are NOT DETECTED.  The SARS-CoV-2 RNA is  generally detectable in upper respiratory specimens during the acute phase of infection. The lowest concentration of SARS-CoV-2 viral copies this assay can detect is 138 copies/mL. A negative result does not preclude SARS-Cov-2 infection and should not be used as the sole basis for treatment or other patient management decisions. A negative  result may occur with  improper specimen collection/handling, submission of specimen other than nasopharyngeal swab, presence of viral mutation(s) within the areas targeted by this assay, and inadequate number of viral copies(<138 copies/mL). A negative result must be combined with clinical observations, patient history, and epidemiological information. The expected result is Negative.  Fact Sheet for Patients:  EntrepreneurPulse.com.au  Fact Sheet for Healthcare Providers:  IncredibleEmployment.be  This test is no t yet approved or cleared by the [place] FDA and  has been authorized for detection and/or diagnosis of SARS-CoV-2 by FDA under an Emergency Use Authorization (EUA). This EUA will remain  in effect (meaning this test can be used) for the duration of the COVID-19 declaration under Section 564(b)(1) of the Act, 21 U.S.C.section 360bbb-3(b)(1), unless the authorization is terminated  or revoked sooner.       Influenza A by PCR NEGATIVE NEGATIVE Final   Influenza B by PCR NEGATIVE NEGATIVE Final    Comment: (NOTE) The Xpert Xpress SARS-CoV-2/FLU/RSV plus assay is intended as an aid in the diagnosis of influenza from Nasopharyngeal swab specimens and should not be used as a sole basis for treatment. Nasal washings and aspirates are unacceptable for Xpert Xpress SARS-CoV-2/FLU/RSV testing.  Fact Sheet for Patients: EntrepreneurPulse.com.au  Fact Sheet for Healthcare Providers: IncredibleEmployment.be  This test is not yet approved or cleared by the [place] FDA and has been authorized for detection and/or diagnosis of SARS-CoV-2 by FDA under an Emergency Use Authorization (EUA). This EUA will remain in effect (meaning this test can be used) for the duration of the COVID-19 declaration under Section 564(b)(1) of the Act, 21 U.S.C. section 360bbb-3(b)(1), unless the authorization is  terminated or revoked.     Resp Syncytial Virus by PCR NEGATIVE NEGATIVE Final    Comment: (NOTE) Fact Sheet for Patients: EntrepreneurPulse.com.au  Fact Sheet for Healthcare Providers: IncredibleEmployment.be  This test is not yet approved or cleared by the [place] FDA and has been authorized for detection and/or diagnosis of SARS-CoV-2 by FDA under an Emergency Use Authorization (EUA). This EUA will remain in effect (meaning this test can be used) for the duration of the COVID-19 declaration under Section 564(b)(1) of the Act, 21 U.S.C. section 360bbb-3(b)(1), unless the authorization is terminated or revoked.  Performed at [facility], [address] [place], [place] [address]   Blood Culture (routine x 2)     Status: None (Preliminary result)   Collection Time: 07/15/22  7:15 PM   Specimen: BLOOD  Result Value Ref Range Status   Specimen Description BLOOD LEFT [facility]  Final   Special Requests   Final    BOTTLES DRAWN AEROBIC AND ANAEROBIC Blood Culture adequate volume   Culture   Final    NO GROWTH 4 DAYS Performed at [facility], [address], [place], [place] [address]    Report Status PENDING  Incomplete  Blood Culture (routine x 2)     Status: None (Preliminary result)   Collection Time: 07/15/22  7:15 PM   Specimen: BLOOD  Result Value Ref Range Status   Specimen Description BLOOD RIGHT [facility]  Final   Special Requests   Final    BOTTLES DRAWN  AEROBIC AND ANAEROBIC Blood Culture adequate volume   Culture   Final    NO GROWTH 4 DAYS Performed at [facility], [address] [place], [place] [address]    Report Status PENDING  Incomplete  MRSA Next Gen by PCR, Nasal     Status: None   Collection Time: 07/16/22  9:50 AM   Specimen: Nasal Mucosa; Nasal Swab  Result Value Ref Range Status   MRSA by PCR Next Gen NOT DETECTED NOT DETECTED Final    Comment: (NOTE) The GeneXpert MRSA Assay (FDA  approved for NASAL specimens only), is one component of a comprehensive MRSA colonization surveillance program. It is not intended to diagnose MRSA infection nor to guide or monitor treatment for MRSA infections. Test performance is not FDA approved in patients less than 25 years old. Performed at [facility], [address] [place], [place] [address]     Procedures and diagnostic studies:  US RENAL  Result Date: 08-08-2022 CLINICAL DATA:  Renal abscess. EXAM: RENAL / URINARY TRACT ULTRASOUND COMPLETE COMPARISON:  Abdominal CT July 15, 2022 FINDINGS: Right Kidney: Renal measurements: 13.0 x 5.3 cm echogenicity within normal limits. No mass or hydronephrosis visualized. Left Kidney: Renal measurements: 13.7 x 6.1 x 6.4 cm = volume: 277 mL. Heterogeneous appearance and thickening of the cortex of the left kidney. Small subcapsular fluid collection versus cyst in the midpole region of the left kidney measures 1.0 x 1.1 x 0.9 cm. Bladder: Appears normal for degree of bladder distention. Bilateral ureteral jets are seen. Other: None. IMPRESSION: Normal sonographic appearance of the right kidney. Heterogeneous appearance and cortical thickening of the left kidney likely representing pyelonephritis. 1.1 cm subcapsular fluid collection versus small cyst in the midpole region of the left kidney. Electronically Signed   By: Fidela Salisbury M.D.   On: 2022-08-08 18:08               LOS: 4 days   Huston Stonehocker  [facility]   Pager on [email or web address]. If 7PM-7AM, please contact night-coverage at [email or web address]     07/19/2022, 10:08 AM

## 2022-07-20 DIAGNOSIS — J984 Other disorders of lung: Secondary | ICD-10-CM | POA: Diagnosis not present

## 2022-07-20 DIAGNOSIS — F17210 Nicotine dependence, cigarettes, uncomplicated: Secondary | ICD-10-CM | POA: Diagnosis not present

## 2022-07-20 DIAGNOSIS — J189 Pneumonia, unspecified organism: Secondary | ICD-10-CM | POA: Diagnosis not present

## 2022-07-20 DIAGNOSIS — F203 Undifferentiated schizophrenia: Secondary | ICD-10-CM | POA: Diagnosis not present

## 2022-07-20 LAB — CULTURE, BLOOD (ROUTINE X 2)
Culture: NO GROWTH
Culture: NO GROWTH
Special Requests: ADEQUATE
Special Requests: ADEQUATE

## 2022-07-20 MED ORDER — AMOXICILLIN-POT CLAVULANATE 875-125 MG PO TABS
1.0000 | ORAL_TABLET | Freq: Two times a day (BID) | ORAL | Status: DC
  Administered 2022-07-20 – 2022-07-27 (×15): 1 via ORAL
  Filled 2022-07-20 (×15): qty 1

## 2022-07-20 NOTE — Progress Notes (Addendum)
Progress Note    Kenneth [name].  JJH:[id number] DOB: 17-Aug-1960  DOA: 07/15/2022 PCP: Pcp, No      Brief Narrative:    Medical records reviewed and are as summarized below:  Kenneth [name]. is a 62 y.o. male with medical history significant for Paranoid schizophrenia, hypertension, diagnosed with iron deficiency anemia in November 2023 for which he received Venofer x 1 by oncology with recommendation for further treatment and colonoscopy but subsequently lost to follow-up.  He presented to the emergency department (from the group home) with auditory hallucinations.  He is on Invega injections for schizophrenia.  In the emergency department reviewed sepsis, pneumonia, cavitary lesion suspicious for abscess versus necrotic mass.  He was also found to have esophageal wall thickening and pericarditis on CT chest.  He was treated with IV fluids and empiric IV antibiotics.        Assessment/Plan:   Principal Problem:   Schizophrenia, undifferentiated ([name]) Active Problems:   Sepsis ([place])   Septic embolism ([name])   Cavitating mass of lung/ lung abscess   Collapse of left lung   Esophageal abnormality   Pericarditis   Anemia   Underweight   Auditory hallucinations   Tobacco use disorder   Paranoid schizophrenia ([place])   Bipolar 1 disorder ([name])   Hypertension   Pneumonia of left lung due to infectious organism   Chronic obstructive lung disease ([name])   Abnormal CT scan, esophagus   Protein-calorie malnutrition, severe    Body mass index is 18.99 kg/m.   Sepsis, left hilar mass (pulmonary abscess versus necrotic mass), left midlung cavitary lesion (abscess versus necrotic mass), right upper lobe density (loculated pleural effusion or developing pneumonia), right lung pulmonary nodules (?  Septic emboli), leukocytosis: Leukocytosis is improving.  AFB and QuantiFERON are pending.  No growth on blood cultures after 5 days.  Continue IV Unasyn.  Follow-up with ID  and pulmonologist.   Suspected left pyelonephritis or early renal abscess noted on CT abdomen and pelvis but urinalysis was unremarkable and patient has no urinary symptoms. Renal ultrasound showed findings concerning for left pyelonephritis.  However, there is no clinical evidence or features suggestive of pyelonephritis at this time.   Esophageal wall thickening: Plan for EGD later in the course of hospitalization.  Follow-up with gastroenterologist.     Pericarditis on CT chest: No chest pain.  No evidence of pericardial effusion on CT chest.   Schizophrenia, bipolar disorder, auditory hallucinations: Continue paliperidone, paroxetine and hydroxyzine.   Constipation: He moved his bowels 07/19/2022.  Continue laxatives as needed.   Thrombocytosis: This is likely reactive  Other comorbidities include tobacco use disorder, chronic anemia, hypertension, memory loss   Diet Order             Diet regular Fluid consistency: Thin  Diet effective now                            Consultants: Gastroenterologist ID specialist Pulmonologist Psychiatrist  Procedures: None    Medications:    atorvastatin  20 mg Oral Daily   enoxaparin (LOVENOX) injection  40 mg Subcutaneous Q24H   feeding supplement  237 mL Oral TID BM   fluticasone furoate-vilanterol  1 puff Inhalation Daily   And   umeclidinium bromide  1 puff Inhalation Daily   hydrOXYzine  25 mg Oral TID   montelukast  10 mg Oral Daily   multivitamin with minerals  1 tablet Oral Daily   oxybutynin  10 mg Oral Daily   paliperidone  3 mg Oral Daily   PARoxetine  20 mg Oral Daily   polyethylene glycol  17 g Oral Daily   sorbitol, milk of mag, mineral oil, glycerin (SMOG) enema  960 mL Rectal Once   Continuous Infusions:  ampicillin-sulbactam (UNASYN) IV 3 g (07/20/22 0605)     Anti-infectives (From admission, onward)    Start     Dose/Rate Route Frequency Ordered Stop   07/16/22 2000  azithromycin  (ZITHROMAX) 500 mg in sodium chloride 0.9 % 250 mL IVPB  Status:  Discontinued        500 mg 250 mL/hr over 60 Minutes Intravenous Every 24 hours 07/15/22 2119 07/16/22 1039   07/16/22 1200  vancomycin (VANCOREADY) IVPB 750 mg/150 mL  Status:  Discontinued        750 mg 150 mL/hr over 60 Minutes Intravenous Every 12 hours 07/15/22 2144 07/16/22 1613   07/16/22 1200  Ampicillin-Sulbactam (UNASYN) 3 g in sodium chloride 0.9 % 100 mL IVPB        3 g 200 mL/hr over 30 Minutes Intravenous Every 6 hours 07/16/22 1039     07/16/22 0400  ceFEPIme (MAXIPIME) 2 g in sodium chloride 0.9 % 100 mL IVPB  Status:  Discontinued        2 g 200 mL/hr over 30 Minutes Intravenous Every 8 hours 07/15/22 2144 07/16/22 1039   07/16/22 0000  azithromycin (ZITHROMAX) 500 mg in sodium chloride 0.9 % 250 mL IVPB  Status:  Discontinued        500 mg 250 mL/hr over 60 Minutes Intravenous Every 24 hours 07/15/22 2105 07/15/22 2119   07/15/22 2145  vancomycin (VANCOREADY) IVPB 500 mg/100 mL        500 mg 100 mL/hr over 60 Minutes Intravenous  Once 07/15/22 2144 07/16/22 0830   07/15/22 1915  vancomycin (VANCOCIN) IVPB 1000 mg/200 mL premix        1,000 mg 200 mL/hr over 60 Minutes Intravenous  Once 07/15/22 1900 07/15/22 2143   07/15/22 1915  ceFEPIme (MAXIPIME) 2 g in sodium chloride 0.9 % 100 mL IVPB        2 g 200 mL/hr over 30 Minutes Intravenous  Once 07/15/22 1900 07/15/22 2005   07/15/22 1915  azithromycin (ZITHROMAX) 500 mg in sodium chloride 0.9 % 250 mL IVPB        500 mg 250 mL/hr over 60 Minutes Intravenous  Once 07/15/22 1900 07/15/22 2143              Family Communication/Anticipated D/C date and plan/Code Status   DVT prophylaxis: enoxaparin (LOVENOX) injection 40 mg Start: 07/16/22 2200 SCDs Start: 07/15/22 2104     Code Status: Full Code  Family Communication: Plan discussed with Verdis Frederickson, [place].  Disposition Plan: Plan to discharge to group home when medically stable.   Status is:  Inpatient Remains inpatient appropriate because: Awaiting TB test       Subjective:   Interval events noted.  He complains of cough productive of yellowish sputum.  No hemoptysis, chest pain or shortness of breath.  No abdominal pain or vomiting.  Objective:    Vitals:   07/19/22 2048 07/20/22 0056 07/20/22 0630 07/20/22 0752  BP: 106/75 101/73 117/71 98/67  Pulse: (!) 104 98 (!) 107 (!) 109  Resp:  (!) 22 (!) 22   Temp: 98.5 F (36.9 C) 98.7 F (37.1 C) 98.4 F (36.9 C) 99.5  F (37.5 C)  TempSrc: Oral     SpO2: 98% 99% 97% 97%  Weight:      Height:       No data found.   Intake/Output Summary (Last 24 hours) at 07/20/2022 1035 Last data filed at 07/20/2022 0707 Gross per 24 hour  Intake 240 ml  Output 2650 ml  Net -2410 ml   Filed Weights   07/15/22 1920  Weight: 63.5 kg    Exam:  GEN: NAD SKIN: Warm and dry EYES: EOMI ENT: MMM CV: RRR PULM: CTA B ABD: soft, ND, NT, +BS CNS: AAO x 3, non focal EXT: No edema or tenderness GU: No CVA tenderness      Data Reviewed:   I have personally reviewed following labs and imaging studies:  Labs: Labs show the following:   Basic Metabolic Panel: Recent Labs  Lab 07/15/22 1519 07/16/22 0331 07/17/22 0435  NA 130* 136 134*  K 3.8 3.7 3.6  CL 94* 104 101  CO2 28 28 27   GLUCOSE 123* 124* 103*  BUN 11 14 10   CREATININE 0.77 0.90 0.66  CALCIUM 9.6 8.9 8.9  MG  --   --  1.8  PHOS  --   --  2.7   GFR Estimated Creatinine Clearance: 88.2 mL/min (by C-G formula based on SCr of 0.66 mg/dL). Liver Function Tests: Recent Labs  Lab 07/15/22 1519 07/16/22 0331  AST 24 23  ALT 44 36  ALKPHOS 110 94  BILITOT 0.7 0.5  PROT 7.2 6.1*  ALBUMIN 2.1* 1.8*   No results for input(s): "LIPASE", "AMYLASE" in the last 168 hours. No results for input(s): "AMMONIA" in the last 168 hours. Coagulation profile Recent Labs  Lab 07/16/22 0331  INR 1.3*    CBC: Recent Labs  Lab 07/15/22 1519 07/16/22 0331  07/17/22 0435 07/18/22 0640 07/19/22 0521  WBC 27.4* 15.4* 18.0* 17.4* 12.6*  NEUTROABS  --   --  14.6* 14.1* 9.5*  HGB 10.1* 8.9* 9.1* 9.5* 9.8*  HCT 31.3* 28.0* 28.3* 29.7* 31.8*  MCV 80.5 81.9 80.6 81.6 82.6  PLT 610* 544* 546* 574* 630*   Cardiac Enzymes: No results for input(s): "CKTOTAL", "CKMB", "CKMBINDEX", "TROPONINI" in the last 168 hours. BNP (last 3 results) No results for input(s): "PROBNP" in the last 8760 hours. CBG: Recent Labs  Lab 07/17/22 0420  GLUCAP 132*   D-Dimer: No results for input(s): "DDIMER" in the last 72 hours. Hgb A1c: No results for input(s): "HGBA1C" in the last 72 hours. Lipid Profile: No results for input(s): "CHOL", "HDL", "LDLCALC", "TRIG", "CHOLHDL", "LDLDIRECT" in the last 72 hours. Thyroid function studies: No results for input(s): "TSH", "T4TOTAL", "T3FREE", "THYROIDAB" in the last 72 hours.  Invalid input(s): "FREET3" Anemia work up: No results for input(s): "VITAMINB12", "FOLATE", "FERRITIN", "TIBC", "IRON", "RETICCTPCT" in the last 72 hours. Sepsis Labs: Recent Labs  Lab 07/15/22 1915 07/15/22 2050 07/16/22 0331 07/17/22 0435 07/18/22 0640 07/19/22 0521  PROCALCITON  --   --  0.12  --   --   --   WBC  --   --  15.4* 18.0* 17.4* 12.6*  LATICACIDVEN 1.3 1.7  --   --   --   --     Microbiology Recent Results (from the past 240 hour(s))  Resp panel by RT-PCR (RSV, Flu A&B, Covid) Anterior Nasal Swab     Status: None   Collection Time: 07/15/22  6:05 PM   Specimen: Anterior Nasal Swab  Result Value Ref Range Status  SARS Coronavirus 2 by RT PCR NEGATIVE NEGATIVE Final    Comment: (NOTE) SARS-CoV-2 target nucleic acids are NOT DETECTED.  The SARS-CoV-2 RNA is generally detectable in upper respiratory specimens during the acute phase of infection. The lowest concentration of SARS-CoV-2 viral copies this assay can detect is 138 copies/mL. A negative result does not preclude SARS-Cov-2 infection and should not be used as  the sole basis for treatment or other patient management decisions. A negative result may occur with  improper specimen collection/handling, submission of specimen other than nasopharyngeal swab, presence of viral mutation(s) within the areas targeted by this assay, and inadequate number of viral copies(<138 copies/mL). A negative result must be combined with clinical observations, patient history, and epidemiological information. The expected result is Negative.  Fact Sheet for Patients:  EntrepreneurPulse.com.au  Fact Sheet for Healthcare Providers:  IncredibleEmployment.be  This test is no t yet approved or cleared by the [place] FDA and  has been authorized for detection and/or diagnosis of SARS-CoV-2 by FDA under an Emergency Use Authorization (EUA). This EUA will remain  in effect (meaning this test can be used) for the duration of the COVID-19 declaration under Section 564(b)(1) of the Act, 21 U.S.C.section 360bbb-3(b)(1), unless the authorization is terminated  or revoked sooner.       Influenza A by PCR NEGATIVE NEGATIVE Final   Influenza B by PCR NEGATIVE NEGATIVE Final    Comment: (NOTE) The Xpert Xpress SARS-CoV-2/FLU/RSV plus assay is intended as an aid in the diagnosis of influenza from Nasopharyngeal swab specimens and should not be used as a sole basis for treatment. Nasal washings and aspirates are unacceptable for Xpert Xpress SARS-CoV-2/FLU/RSV testing.  Fact Sheet for Patients: EntrepreneurPulse.com.au  Fact Sheet for Healthcare Providers: IncredibleEmployment.be  This test is not yet approved or cleared by the [place] FDA and has been authorized for detection and/or diagnosis of SARS-CoV-2 by FDA under an Emergency Use Authorization (EUA). This EUA will remain in effect (meaning this test can be used) for the duration of the COVID-19 declaration under Section 564(b)(1) of  the Act, 21 U.S.C. section 360bbb-3(b)(1), unless the authorization is terminated or revoked.     Resp Syncytial Virus by PCR NEGATIVE NEGATIVE Final    Comment: (NOTE) Fact Sheet for Patients: EntrepreneurPulse.com.au  Fact Sheet for Healthcare Providers: IncredibleEmployment.be  This test is not yet approved or cleared by the [place] FDA and has been authorized for detection and/or diagnosis of SARS-CoV-2 by FDA under an Emergency Use Authorization (EUA). This EUA will remain in effect (meaning this test can be used) for the duration of the COVID-19 declaration under Section 564(b)(1) of the Act, 21 U.S.C. section 360bbb-3(b)(1), unless the authorization is terminated or revoked.  Performed at [facility], [address] [place], [place] [address]   Blood Culture (routine x 2)     Status: None   Collection Time: 07/15/22  7:15 PM   Specimen: BLOOD  Result Value Ref Range Status   Specimen Description BLOOD LEFT [facility]  Final   Special Requests   Final    BOTTLES DRAWN AEROBIC AND ANAEROBIC Blood Culture adequate volume   Culture   Final    NO GROWTH 5 DAYS Performed at [facility], [address], [place], [place] [address]    Report Status 07/20/2022 FINAL  Final  Blood Culture (routine x 2)     Status: None   Collection Time: 07/15/22  7:15 PM   Specimen: BLOOD  Result Value Ref  Range Status   Specimen Description BLOOD RIGHT [facility]  Final   Special Requests   Final    BOTTLES DRAWN AEROBIC AND ANAEROBIC Blood Culture adequate volume   Culture   Final    NO GROWTH 5 DAYS Performed at [facility], [address], [place], [place] [address]    Report Status 07/20/2022 FINAL  Final  MRSA Next Gen by PCR, Nasal     Status: None   Collection Time: 07/16/22  9:50 AM   Specimen: Nasal Mucosa; Nasal Swab  Result Value Ref Range Status   MRSA by PCR Next Gen NOT DETECTED NOT DETECTED Final    Comment:  (NOTE) The GeneXpert MRSA Assay (FDA approved for NASAL specimens only), is one component of a comprehensive MRSA colonization surveillance program. It is not intended to diagnose MRSA infection nor to guide or monitor treatment for MRSA infections. Test performance is not FDA approved in patients less than 53 years old. Performed at [facility] [address], [place], [place] [address]     Procedures and diagnostic studies:  US RENAL  Result Date: 07/26/2022 CLINICAL DATA:  Renal abscess. EXAM: RENAL / URINARY TRACT ULTRASOUND COMPLETE COMPARISON:  Abdominal CT July 15, 2022 FINDINGS: Right Kidney: Renal measurements: 13.0 x 5.3 cm echogenicity within normal limits. No mass or hydronephrosis visualized. Left Kidney: Renal measurements: 13.7 x 6.1 x 6.4 cm = volume: 277 mL. Heterogeneous appearance and thickening of the cortex of the left kidney. Small subcapsular fluid collection versus cyst in the midpole region of the left kidney measures 1.0 x 1.1 x 0.9 cm. Bladder: Appears normal for degree of bladder distention. Bilateral ureteral jets are seen. Other: None. IMPRESSION: Normal sonographic appearance of the right kidney. Heterogeneous appearance and cortical thickening of the left kidney likely representing pyelonephritis. 1.1 cm subcapsular fluid collection versus small cyst in the midpole region of the left kidney. Electronically Signed   By: Fidela Salisbury M.D.   On: 26-Jul-2022 18:08               LOS: 5 days   Trinten Boudoin  [facility]   Pager on [email or web address]. If 7PM-7AM, please contact night-coverage at [email or web address]     07/20/2022, 10:35 AM

## 2022-07-20 NOTE — Progress Notes (Signed)
[facility] for Infectious Disease  Date of Admission:  07/15/2022           Reason for visit: Follow up on cavitary pneumonia, lung mass  Current antibiotics: Unasyn  ASSESSMENT:    62 y.o. [name] admitted with:  Pulmonary left hilum mass, left mid lung cavitary lesion, and opacification of the left lung with low-attenuation fluid/debris in the LUL: Differential includes infectious pulmonary abscess, necrotic mass, vs malignancy. Right lung pulmonary nodules: Differential includes septic emboli vs metastatic disease. Left kidney hypodensity with surrounding inflammatory stranding: Radiology read on CT raised concern for pyelonephritis/early abscess.  Additionally, renal US showed similar findings.  However, UA and clinical exam not consistent with these radiographic findings.  Tobacco use: Patient with ongoing tobacco use disorder since age 81. Anemia: Prior history of iron deficiency and no prior endoscopic evaluation per his report.  Schizophrenia: Currently residing in a group home.  RECOMMENDATIONS:    Continue antibiotics and change Unasyn to Augmentin given his clinical stability Overall, highly suspicious for malignancy, but WBC much improved with antibiotic therapy.  Pulmonary evaluated patient and is planning for outpatient work up.   However, patient resides in group home and his presenting symptoms of cough, night sweats, weight loss, and imaging findings likely warrants TB rule out.  AFB sputum x 3 has been ordered since 07/16/22 but not yet collected Discussed with patient and RN who will provide patient with specimen cup.  He reports coughing up some sputum, but if not perhaps pulmonary can help induce sputum if not planning for inpatient bronchoscopy Quantiferon pending Following   Principal Problem:   Schizophrenia, undifferentiated (Kenneth [name]) Active Problems:   Tobacco use disorder   Paranoid schizophrenia (Kenneth [name])   Bipolar 1 disorder ([place])   Hypertension    Pneumonia of left lung due to infectious organism   Sepsis (Kenneth [name])   Chronic obstructive lung disease (Kenneth [name])   Cavitating mass of lung/ lung abscess   Auditory hallucinations   Anemia   Esophageal abnormality   Septic embolism ([name])   Underweight   Collapse of left lung   Pericarditis   Abnormal CT scan, esophagus   Protein-calorie malnutrition, severe    MEDICATIONS:    Scheduled Meds:  amoxicillin-clavulanate  1 tablet Oral Q12H   atorvastatin  20 mg Oral Daily   enoxaparin (LOVENOX) injection  40 mg Subcutaneous Q24H   feeding supplement  237 mL Oral TID BM   fluticasone furoate-vilanterol  1 puff Inhalation Daily   And   umeclidinium bromide  1 puff Inhalation Daily   hydrOXYzine  25 mg Oral TID   montelukast  10 mg Oral Daily   multivitamin with minerals  1 tablet Oral Daily   oxybutynin  10 mg Oral Daily   paliperidone  3 mg Oral Daily   PARoxetine  20 mg Oral Daily   polyethylene glycol  17 g Oral Daily   sorbitol, milk of mag, mineral oil, glycerin (SMOG) enema  960 mL Rectal Once   Continuous Infusions: PRN Meds:.acetaminophen **OR** acetaminophen, albuterol, HYDROcodone-acetaminophen, ondansetron **OR** ondansetron (ZOFRAN) IV, senna-docusate  SUBJECTIVE:   24 hour events:  No acute events No fevers  Reports coughing some sputum up Feeling overall pretty well No new complaints    Review of Systems  All other systems reviewed and are negative.     OBJECTIVE:   Blood pressure 98/67, pulse (!) 109, temperature 99.5 F (37.5 C), resp. rate (!) 22, height 6' (1.829 m), weight 63.5 kg,  SpO2 97 %. Body mass index is 18.99 kg/m.  Physical Exam Constitutional:      Appearance: Normal appearance.  Eyes:     Extraocular Movements: Extraocular movements intact.     Conjunctiva/sclera: Conjunctivae normal.  Pulmonary:     Effort: Pulmonary effort is normal. No respiratory distress.  Abdominal:     General: There is no distension.     Palpations:  Abdomen is soft.  Musculoskeletal:        General: Normal range of motion.     Cervical back: Normal range of motion and neck supple.  Skin:    General: Skin is warm and dry.  Neurological:     General: No focal deficit present.     Mental Status: He is alert and oriented to person, place, and time.  Psychiatric:        Mood and Affect: Mood normal.        Behavior: Behavior normal.      Lab Results: Lab Results  Component Value Date   WBC 12.6 (H) 07/19/2022   HGB 9.8 (L) 07/19/2022   HCT 31.8 (L) 07/19/2022   MCV 82.6 07/19/2022   PLT 630 (H) 07/19/2022    Lab Results  Component Value Date   NA 134 (L) 07/17/2022   K 3.6 07/17/2022   CO2 27 07/17/2022   GLUCOSE 103 (H) 07/17/2022   BUN 10 07/17/2022   CREATININE 0.66 07/17/2022   CALCIUM 8.9 07/17/2022   GFRNONAA >60 07/17/2022   GFRAA >60 11/21/2018    Lab Results  Component Value Date   ALT 36 07/16/2022   AST 23 07/16/2022   ALKPHOS 94 07/16/2022   BILITOT 0.5 07/16/2022    No results found for: "CRP"  No results found for: "ESRSEDRATE"   I have reviewed the micro and lab results in Epic.  Imaging: US RENAL  Result Date: 07/18/2022 CLINICAL DATA:  Renal abscess. EXAM: RENAL / URINARY TRACT ULTRASOUND COMPLETE COMPARISON:  Abdominal CT July 15, 2022 FINDINGS: Right Kidney: Renal measurements: 13.0 x 5.3 cm echogenicity within normal limits. No mass or hydronephrosis visualized. Left Kidney: Renal measurements: 13.7 x 6.1 x 6.4 cm = volume: 277 mL. Heterogeneous appearance and thickening of the cortex of the left kidney. Small subcapsular fluid collection versus cyst in the midpole region of the left kidney measures 1.0 x 1.1 x 0.9 cm. Bladder: Appears normal for degree of bladder distention. Bilateral ureteral jets are seen. Other: None. IMPRESSION: Normal sonographic appearance of the right kidney. Heterogeneous appearance and cortical thickening of the left kidney likely representing pyelonephritis. 1.1  cm subcapsular fluid collection versus small cyst in the midpole region of the left kidney. Electronically Signed   By: Fidela Salisbury M.D.   On: 07/18/2022 18:08     Imaging  independently reviewed in Epic.    [facility] [phone] pager 07/20/2022, 1:00 PM

## 2022-07-20 NOTE — Plan of Care (Signed)

## 2022-07-20 NOTE — TOC Progression Note (Signed)
Transition of Care (TOC) - Progression Note    Patient Details  Name: Kenneth [name]. MRN: [id number] Date of Birth: 07-22-1960  Transition [facility]) CM/SW Contact  Laurena Slimmer, RN Phone Number: 07/20/2022, 10:42 AM  Clinical Narrative:    Case reviewed for needs and changes to disposition.         Expected Discharge Plan and Services                                               Social Determinants of Health (SDOH) Interventions SDOH Screenings   Food Insecurity: No Food Insecurity (07/16/2022)  Housing: Low Risk  (07/16/2022)  Transportation Needs: No Transportation Needs (07/16/2022)  Utilities: Not At Risk (07/16/2022)  Alcohol Screen: Low Risk  (03/16/2018)  Tobacco Use: High Risk (07/17/2022)    Readmission Risk Interventions     No data to display

## 2022-07-20 NOTE — Progress Notes (Signed)
PULMONOLOGY         Date: 07/20/2022,   MRN# [id number] Kenneth [name]. [birth year]     AdmissionWeight: 63.5 kg                 CurrentWeight: 63.5 kg  Referring provider: Dr. Mal Misty   CHIEF COMPLAINT:   Left lung mass   HISTORY OF PRESENT ILLNESS   This is a pleasant 62 year old male with a history of psychiatric disorder and memory loss essential hypertension iron deficiency anemia, who came in from outpatient oncology evaluation due to ongoing progressive anemia.  Patient reported hallucinations also reports cough denies shortness of breath had a CT chest performed which shows left lung mass.  He denies chest pain or discomfort.  He denies SOB.  He shares his grandfather had colon cancer.    He smokes 3 cigs a day and he started smoking at age 11, he used to smoke 1 pack a day.    PCCM consultation for additional evaluation management.   07/18/22- patient seen at bedside this am.  He is on room air with no SOB/Dyspnea.  No resp  cultures done yet.  MRSA screen and procalcitonin trend normal.  Patient is paranoid and delusional.  He requested that I remove all the pork out of room before I can speak with him. He is doing well from resp perspective.  I will continue to monitor him peripherally and will do most of the cancer workup on outpatient.    07/20/22- patient is stable on room air.  He shares he will be living with his cousin Verdis Frederickson post discharge.  He has my contact information and we can plan cancer workup on outpatient.  He has infectious disease following for cavitary lung disease. Patient has no cough and is not toxic appearing.   PAST MEDICAL HISTORY   Past Medical History:  Diagnosis Date   Bipolar 1 disorder ([place])    Hypertension    Memory loss    Paranoid schizophrenia ([place])      SURGICAL HISTORY   History reviewed. No pertinent surgical history.   FAMILY HISTORY   Family History  Problem Relation Age of Onset   Diabetes Mother    Cancer  Mother    Cancer Maternal Grandfather      SOCIAL HISTORY   Social History   Tobacco Use   Smoking status: Every Day    Types: Cigarettes   Smokeless tobacco: Never  Substance Use Topics   Alcohol use: Not Currently   Drug use: Never     MEDICATIONS    Home Medication:    Current Medication:  Current Facility-Administered Medications:    acetaminophen (TYLENOL) tablet 650 mg, 650 mg, Oral, Q6H PRN, 650 mg at 07/19/22 2147 **OR** acetaminophen (TYLENOL) suppository 650 mg, 650 mg, Rectal, Q6H PRN, Athena Masse, MD   albuterol (PROVENTIL) (2.5 MG/3ML) 0.083% nebulizer solution 2.5 mg, 2.5 mg, Nebulization, Q6H PRN, Athena Masse, MD   amoxicillin-clavulanate (AUGMENTIN) 875-125 MG per tablet 1 tablet, 1 tablet, Oral, Q12H, Mignon Pine, DO, 1 tablet at 07/20/22 1334   atorvastatin (LIPITOR) tablet 20 mg, 20 mg, Oral, Daily, Judd Gaudier V, MD, 20 mg at 07/20/22 0903   enoxaparin (LOVENOX) injection 40 mg, 40 mg, Subcutaneous, Q24H, Jennye Boroughs, MD, 40 mg at 07/19/22 2147   feeding supplement (ENSURE ENLIVE / ENSURE PLUS) liquid 237 mL, 237 mL, Oral, TID BM, Jennye Boroughs, MD, 237 mL at 07/20/22 1334   fluticasone  furoate-vilanterol (BREO ELLIPTA) 200-25 MCG/ACT 1 puff, 1 puff, Inhalation, Daily, 1 puff at 07/20/22 0902 **AND** umeclidinium bromide (INCRUSE ELLIPTA) 62.5 MCG/ACT 1 puff, 1 puff, Inhalation, Daily, Judd Gaudier V, MD, 1 puff at 07/20/22 0902   HYDROcodone-acetaminophen (NORCO/VICODIN) 5-325 MG per tablet 1-2 tablet, 1-2 tablet, Oral, Q4H PRN, Athena Masse, MD, 2 tablet at 07/20/22 1601   hydrOXYzine (ATARAX) tablet 25 mg, 25 mg, Oral, TID, Athena Masse, MD, 25 mg at 07/20/22 1601   montelukast (SINGULAIR) tablet 10 mg, 10 mg, Oral, Daily, Judd Gaudier V, MD, 10 mg at 07/20/22 8413   multivitamin with minerals tablet 1 tablet, 1 tablet, Oral, Daily, Jennye Boroughs, MD, 1 tablet at 07/20/22 0903   ondansetron (ZOFRAN) tablet 4 mg, 4 mg, Oral, Q6H  PRN **OR** ondansetron (ZOFRAN) injection 4 mg, 4 mg, Intravenous, Q6H PRN, Athena Masse, MD   oxybutynin (DITROPAN-XL) 24 hr tablet 10 mg, 10 mg, Oral, Daily, Judd Gaudier V, MD, 10 mg at 07/20/22 2440   paliperidone (INVEGA) 24 hr tablet 3 mg, 3 mg, Oral, Daily, Bennett, Christal H, NP, 3 mg at 07/20/22 1027   PARoxetine (PAXIL) tablet 20 mg, 20 mg, Oral, Daily, Judd Gaudier V, MD, 20 mg at 07/20/22 2536   polyethylene glycol (MIRALAX / GLYCOLAX) packet 17 g, 17 g, Oral, Daily, Jennye Boroughs, MD, 17 g at 07/20/22 6440   senna-docusate (Senokot-S) tablet 1 tablet, 1 tablet, Oral, QHS PRN, Jennye Boroughs, MD, 1 tablet at 07/19/22 0057   sorbitol, milk of mag, mineral oil, glycerin (SMOG) enema, 960 mL, Rectal, Once, Jennye Boroughs, MD    ALLERGIES   Patient has no known allergies.     REVIEW OF SYSTEMS    Review of Systems:  Gen:  Denies  fever, sweats, chills weigh loss  HEENT: Denies blurred vision, double vision, ear pain, eye pain, hearing loss, nose bleeds, sore throat Cardiac:  No dizziness, chest pain or heaviness, chest tightness,edema Resp:   reports dyspnea chronically  Gi: Denies swallowing difficulty, stomach pain, nausea or vomiting, diarrhea, constipation, bowel incontinence Gu:  Denies bladder incontinence, burning urine Ext:   Denies Joint pain, stiffness or swelling Skin: Denies  skin rash, easy bruising or bleeding or hives Endoc:  Denies polyuria, polydipsia , polyphagia or weight change Psych:   Denies depression, insomnia or hallucinations   Other:  All other systems negative   VS: BP 98/67 (BP Location: Left Arm)   Pulse (!) 109   Temp 99.5 F (37.5 C)   Resp (!) 22   Ht 6' (1.829 m)   Wt 63.5 kg   SpO2 97%   BMI 18.99 kg/m      PHYSICAL EXAM    GENERAL:NAD, no fevers, chills, no weakness no fatigue HEAD: Normocephalic, atraumatic.  EYES: Pupils equal, round, reactive to light. Extraocular muscles intact. No scleral icterus.  MOUTH:  Moist mucosal membrane. Dentition intact. No abscess noted.  EAR, NOSE, THROAT: Clear without exudates. No external lesions.  NECK: Supple. No thyromegaly. No nodules. No JVD.  PULMONARY: decreased breath sounds with mild rhonchi worse at bases bilaterally.  CARDIOVASCULAR: S1 and S2. Regular rate and rhythm. No murmurs, rubs, or gallops. No edema. Pedal pulses 2+ bilaterally.  GASTROINTESTINAL: Soft, nontender, nondistended. No masses. Positive bowel sounds. No hepatosplenomegaly.  MUSCULOSKELETAL: No swelling, clubbing, or edema. Range of motion full in all extremities.  NEUROLOGIC: Cranial nerves II through XII are intact. No gross focal neurological deficits. Sensation intact. Reflexes intact.  SKIN: No ulceration, lesions,  rashes, or cyanosis. Skin warm and dry. Turgor intact.  PSYCHIATRIC: Mood, affect within normal limits. The patient is awake, alert and oriented x 3. Insight, judgment intact.       IMAGING     ASSESSMENT/PLAN   Left lung mass   Patient interested in diagnosis and treatment -we will likely need to set up PET scan on outpatient and biopsies based on this study - Oncology on case appreciate input -Patient shares he wants everything done.          Thank you for allowing me to participate in the care of this patient.   Patient/Family are satisfied with care plan and all questions have been answered.    Provider disclosure: Patient with at least one acute or chronic illness or injury that poses a threat to life or bodily function and is being managed actively during this encounter.  All of the below services have been performed independently by signing provider:  review of prior documentation from internal and or external health records.  Review of previous and current lab results.  Interview and comprehensive assessment during patient visit today. Review of current and previous chest radiographs/CT scans. Discussion of management and test interpretation with  health care team and patient/family.   This document was prepared using Dragon voice recognition software and may include unintentional dictation errors.     Ottie Glazier, M.D.  Division of Pulmonary & Critical Care Medicine

## 2022-07-21 DIAGNOSIS — J984 Other disorders of lung: Secondary | ICD-10-CM | POA: Diagnosis not present

## 2022-07-21 DIAGNOSIS — F203 Undifferentiated schizophrenia: Secondary | ICD-10-CM | POA: Diagnosis not present

## 2022-07-21 LAB — CBC WITH DIFFERENTIAL/PLATELET
Abs Immature Granulocytes: 0.09 10*3/uL — ABNORMAL HIGH (ref 0.00–0.07)
Basophils Absolute: 0.1 10*3/uL (ref 0.0–0.1)
Basophils Relative: 1 %
Eosinophils Absolute: 0.2 10*3/uL (ref 0.0–0.5)
Eosinophils Relative: 2 %
HCT: 32.4 % — ABNORMAL LOW (ref 39.0–52.0)
Hemoglobin: 10.1 g/dL — ABNORMAL LOW (ref 13.0–17.0)
Immature Granulocytes: 1 %
Lymphocytes Relative: 11 %
Lymphs Abs: 1.6 10*3/uL (ref 0.7–4.0)
MCH: 25.6 pg — ABNORMAL LOW (ref 26.0–34.0)
MCHC: 31.2 g/dL (ref 30.0–36.0)
MCV: 82.2 fL (ref 80.0–100.0)
Monocytes Absolute: 1.3 10*3/uL — ABNORMAL HIGH (ref 0.1–1.0)
Monocytes Relative: 9 %
Neutro Abs: 11.1 10*3/uL — ABNORMAL HIGH (ref 1.7–7.7)
Neutrophils Relative %: 76 %
Platelets: 648 10*3/uL — ABNORMAL HIGH (ref 150–400)
RBC: 3.94 MIL/uL — ABNORMAL LOW (ref 4.22–5.81)
RDW: 15.3 % (ref 11.5–15.5)
WBC: 14.3 10*3/uL — ABNORMAL HIGH (ref 4.0–10.5)
nRBC: 0 % (ref 0.0–0.2)

## 2022-07-21 LAB — QUANTIFERON-TB GOLD PLUS (RQFGPL)
QuantiFERON Mitogen Value: 0.9 IU/mL
QuantiFERON Nil Value: 0.05 IU/mL
QuantiFERON TB1 Ag Value: 0.04 IU/mL
QuantiFERON TB2 Ag Value: 0.08 IU/mL

## 2022-07-21 LAB — ACID FAST SMEAR (AFB, MYCOBACTERIA): Acid Fast Smear: NEGATIVE

## 2022-07-21 LAB — QUANTIFERON-TB GOLD PLUS: QuantiFERON-TB Gold Plus: NEGATIVE

## 2022-07-21 NOTE — Care Management Important Message (Signed)
Important Message  Patient Details  Name: Kenneth [name]. MRN: [id number] Date of Birth: 05/08/1961   Medicare Important Message Given:  Yes  Reviewed Medicare IM with patient via room phone [phone]).  Declined receiving copy of Medicare IM at this time.   Dannette Barbara 07/21/2022, 12:58 PM

## 2022-07-21 NOTE — Progress Notes (Addendum)
Progress Note    Kenneth [name].  OIZ:[id number] DOB: Mar 22, 1961  DOA: 07/15/2022 PCP: Pcp, No      Brief Narrative:    Medical records reviewed and are as summarized below:  Kenneth [name]. is a 62 y.o. male with medical history significant for Paranoid schizophrenia, hypertension, diagnosed with iron deficiency anemia in November 2023 for which he received Venofer x 1 by oncology with recommendation for further treatment and colonoscopy but subsequently lost to follow-up.  He presented to the emergency department (from the group home) with auditory hallucinations.  He is on Invega injections for schizophrenia.  In the emergency department reviewed sepsis, pneumonia, cavitary lesion suspicious for abscess versus necrotic mass.  He was also found to have esophageal wall thickening and pericarditis on CT chest.  He was treated with IV fluids and empiric IV antibiotics.        Assessment/Plan:   Principal Problem:   Schizophrenia, undifferentiated ([name]) Active Problems:   Sepsis ([place])   Septic embolism ([name])   Cavitating mass of lung/ lung abscess   Collapse of left lung   Esophageal abnormality   Pericarditis   Anemia   Underweight   Auditory hallucinations   Tobacco use disorder   Paranoid schizophrenia ([place])   Bipolar 1 disorder ([name])   Hypertension   Pneumonia of left lung due to infectious organism   Chronic obstructive lung disease ([name])   Abnormal CT scan, esophagus   Protein-calorie malnutrition, severe    Body mass index is 18.99 kg/m.   Sepsis, left hilar mass (pulmonary abscess versus necrotic mass), left midlung cavitary lesion (abscess versus necrotic mass), right upper lobe density (loculated pleural effusion or developing pneumonia), right lung pulmonary nodules (?  Septic emboli), leukocytosis: Leukocytosis is improving.  AFB and QuantiFERON are pending.  No growth on blood cultures after 5 days.  IV Unasyn has been changed to Augmentin.   Follow-up with ID and pulmonologist.  Pulmonologist is recommending PET scan as an outpatient.  He can then follow-up with an oncologist if needed. Suspected left pyelonephritis or early renal abscess noted on CT abdomen and pelvis but urinalysis was unremarkable and patient has no urinary symptoms. Renal ultrasound showed findings concerning for left pyelonephritis.  However, there is no clinical evidence or features suggestive of pyelonephritis at this time.   Esophageal wall thickening: Plan for EGD later in the course of hospitalization.  Please reengage gastroenterologist when AFB is negative.   Pericarditis on CT chest: No chest pain.  No evidence of pericardial effusion on CT chest.   Schizophrenia, bipolar disorder, auditory hallucinations: Continue paliperidone, paroxetine and hydroxyzine. Message from Dietrich Pates, NP with psychiatry via secure chat: "He can continue the oral Invega and follow-up with his outpatient provider. Per the group home, his initial dose of [name] is due Feb. 08".    Constipation: He moved his bowels 07/19/2022.  Continue laxatives as needed.   Thrombocytosis: This is likely reactive  Other comorbidities include tobacco use disorder, chronic anemia, hypertension, memory loss   Diet Order             Diet regular Fluid consistency: Thin  Diet effective now                            Consultants: Gastroenterologist ID specialist Pulmonologist Psychiatrist  Procedures: None    Medications:    amoxicillin-clavulanate  1 tablet Oral Q12H   atorvastatin  20 mg Oral Daily   enoxaparin (LOVENOX) injection  40 mg Subcutaneous Q24H   feeding supplement  237 mL Oral TID BM   fluticasone furoate-vilanterol  1 puff Inhalation Daily   And   umeclidinium bromide  1 puff Inhalation Daily   hydrOXYzine  25 mg Oral TID   montelukast  10 mg Oral Daily   multivitamin with minerals  1 tablet Oral Daily   oxybutynin  10 mg Oral  Daily   paliperidone  3 mg Oral Daily   PARoxetine  20 mg Oral Daily   polyethylene glycol  17 g Oral Daily   sorbitol, milk of mag, mineral oil, glycerin (SMOG) enema  960 mL Rectal Once   Continuous Infusions:     Anti-infectives (From admission, onward)    Start     Dose/Rate Route Frequency Ordered Stop   07/20/22 1200  amoxicillin-clavulanate (AUGMENTIN) 875-125 MG per tablet 1 tablet        1 tablet Oral Every 12 hours 07/20/22 1112     07/16/22 2000  azithromycin (ZITHROMAX) 500 mg in sodium chloride 0.9 % 250 mL IVPB  Status:  Discontinued        500 mg 250 mL/hr over 60 Minutes Intravenous Every 24 hours 07/15/22 2119 07/16/22 1039   07/16/22 1200  vancomycin (VANCOREADY) IVPB 750 mg/150 mL  Status:  Discontinued        750 mg 150 mL/hr over 60 Minutes Intravenous Every 12 hours 07/15/22 2144 07/16/22 1613   07/16/22 1200  Ampicillin-Sulbactam (UNASYN) 3 g in sodium chloride 0.9 % 100 mL IVPB  Status:  Discontinued        3 g 200 mL/hr over 30 Minutes Intravenous Every 6 hours 07/16/22 1039 07/20/22 1112   07/16/22 0400  ceFEPIme (MAXIPIME) 2 g in sodium chloride 0.9 % 100 mL IVPB  Status:  Discontinued        2 g 200 mL/hr over 30 Minutes Intravenous Every 8 hours 07/15/22 2144 07/16/22 1039   07/16/22 0000  azithromycin (ZITHROMAX) 500 mg in sodium chloride 0.9 % 250 mL IVPB  Status:  Discontinued        500 mg 250 mL/hr over 60 Minutes Intravenous Every 24 hours 07/15/22 2105 07/15/22 2119   07/15/22 2145  vancomycin (VANCOREADY) IVPB 500 mg/100 mL        500 mg 100 mL/hr over 60 Minutes Intravenous  Once 07/15/22 2144 07/16/22 0830   07/15/22 1915  vancomycin (VANCOCIN) IVPB 1000 mg/200 mL premix        1,000 mg 200 mL/hr over 60 Minutes Intravenous  Once 07/15/22 1900 07/15/22 2143   07/15/22 1915  ceFEPIme (MAXIPIME) 2 g in sodium chloride 0.9 % 100 mL IVPB        2 g 200 mL/hr over 30 Minutes Intravenous  Once 07/15/22 1900 07/15/22 2005   07/15/22 1915   azithromycin (ZITHROMAX) 500 mg in sodium chloride 0.9 % 250 mL IVPB        500 mg 250 mL/hr over 60 Minutes Intravenous  Once 07/15/22 1900 07/15/22 2143              Family Communication/Anticipated D/C date and plan/Code Status   DVT prophylaxis: enoxaparin (LOVENOX) injection 40 mg Start: 07/16/22 2200 SCDs Start: 07/15/22 2104     Code Status: Full Code  Family Communication: None Disposition Plan: Plan to discharge to group home when medically stable.   Status is: Inpatient Remains inpatient appropriate because: Awaiting TB test  Subjective:   He has no complaints.  He has an occasional cough but is not producing sputum today.  He was able to provide a sputum specimen yesterday.  Robert Bellow, RN, was at the bedside  Objective:    Vitals:   07/20/22 1712 07/20/22 2138 07/21/22 0322 07/21/22 0819  BP: 106/66 102/62 (!) 101/57 105/75  Pulse: 100 93 (!) 102 (!) 106  Resp: 19 18 18 17   Temp: 99.5 F (37.5 C) 98.1 F (36.7 C) 98.3 F (36.8 C) 98 F (36.7 C)  TempSrc:   Oral Oral  SpO2: 97% 98% 97% 97%  Weight:      Height:       No data found.   Intake/Output Summary (Last 24 hours) at 07/21/2022 1100 Last data filed at 07/21/2022 9323 Gross per 24 hour  Intake 1200 ml  Output 1000 ml  Net 200 ml   Filed Weights   07/15/22 1920  Weight: 63.5 kg    Exam:  GEN: NAD, sitting up in the chair SKIN: Warm and dry EYES: No pallor or icterus ENT: MMM CV: RRR PULM: CTA B ABD: soft, ND, NT, +BS CNS: AAO x 3, non focal EXT: No edema or tenderness PSYCH: Calm and cooperative GU: No CVA tenderness    Data Reviewed:   I have personally reviewed following labs and imaging studies:  Labs: Labs show the following:   Basic Metabolic Panel: Recent Labs  Lab 07/15/22 1519 07/16/22 0331 07/17/22 0435  NA 130* 136 134*  K 3.8 3.7 3.6  CL 94* 104 101  CO2 28 28 27   GLUCOSE 123* 124* 103*  BUN 11 14 10   CREATININE 0.77 0.90 0.66  CALCIUM  9.6 8.9 8.9  MG  --   --  1.8  PHOS  --   --  2.7   GFR Estimated Creatinine Clearance: 88.2 mL/min (by C-G formula based on SCr of 0.66 mg/dL). Liver Function Tests: Recent Labs  Lab 07/15/22 1519 07/16/22 0331  AST 24 23  ALT 44 36  ALKPHOS 110 94  BILITOT 0.7 0.5  PROT 7.2 6.1*  ALBUMIN 2.1* 1.8*   No results for input(s): "LIPASE", "AMYLASE" in the last 168 hours. No results for input(s): "AMMONIA" in the last 168 hours. Coagulation profile Recent Labs  Lab 07/16/22 0331  INR 1.3*    CBC: Recent Labs  Lab 07/16/22 0331 07/17/22 0435 07/18/22 0640 07/19/22 0521 07/21/22 0746  WBC 15.4* 18.0* 17.4* 12.6* 14.3*  NEUTROABS  --  14.6* 14.1* 9.5* 11.1*  HGB 8.9* 9.1* 9.5* 9.8* 10.1*  HCT 28.0* 28.3* 29.7* 31.8* 32.4*  MCV 81.9 80.6 81.6 82.6 82.2  PLT 544* 546* 574* 630* 648*   Cardiac Enzymes: No results for input(s): "CKTOTAL", "CKMB", "CKMBINDEX", "TROPONINI" in the last 168 hours. BNP (last 3 results) No results for input(s): "PROBNP" in the last 8760 hours. CBG: Recent Labs  Lab 07/17/22 0420  GLUCAP 132*   D-Dimer: No results for input(s): "DDIMER" in the last 72 hours. Hgb A1c: No results for input(s): "HGBA1C" in the last 72 hours. Lipid Profile: No results for input(s): "CHOL", "HDL", "LDLCALC", "TRIG", "CHOLHDL", "LDLDIRECT" in the last 72 hours. Thyroid function studies: No results for input(s): "TSH", "T4TOTAL", "T3FREE", "THYROIDAB" in the last 72 hours.  Invalid input(s): "FREET3" Anemia work up: No results for input(s): "VITAMINB12", "FOLATE", "FERRITIN", "TIBC", "IRON", "RETICCTPCT" in the last 72 hours. Sepsis Labs: Recent Labs  Lab 07/15/22 1915 07/15/22 2050 07/16/22 5573 07/17/22 0435 07/18/22 2202 07/19/22 0521 07/21/22  33  PROCALCITON  --   --  0.12  --   --   --   --   WBC  --   --  15.4* 18.0* 17.4* 12.6* 14.3*  LATICACIDVEN 1.3 1.7  --   --   --   --   --     Microbiology Recent Results (from the past 240  hour(s))  Resp panel by RT-PCR (RSV, Flu A&B, Covid) Anterior Nasal Swab     Status: None   Collection Time: 07/15/22  6:05 PM   Specimen: Anterior Nasal Swab  Result Value Ref Range Status   SARS Coronavirus 2 by RT PCR NEGATIVE NEGATIVE Final    Comment: (NOTE) SARS-CoV-2 target nucleic acids are NOT DETECTED.  The SARS-CoV-2 RNA is generally detectable in upper respiratory specimens during the acute phase of infection. The lowest concentration of SARS-CoV-2 viral copies this assay can detect is 138 copies/mL. A negative result does not preclude SARS-Cov-2 infection and should not be used as the sole basis for treatment or other patient management decisions. A negative result may occur with  improper specimen collection/handling, submission of specimen other than nasopharyngeal swab, presence of viral mutation(s) within the areas targeted by this assay, and inadequate number of viral copies(<138 copies/mL). A negative result must be combined with clinical observations, patient history, and epidemiological information. The expected result is Negative.  Fact Sheet for Patients:  EntrepreneurPulse.com.au  Fact Sheet for Healthcare Providers:  IncredibleEmployment.be  This test is no t yet approved or cleared by the [place] FDA and  has been authorized for detection and/or diagnosis of SARS-CoV-2 by FDA under an Emergency Use Authorization (EUA). This EUA will remain  in effect (meaning this test can be used) for the duration of the COVID-19 declaration under Section 564(b)(1) of the Act, 21 U.S.C.section 360bbb-3(b)(1), unless the authorization is terminated  or revoked sooner.       Influenza A by PCR NEGATIVE NEGATIVE Final   Influenza B by PCR NEGATIVE NEGATIVE Final    Comment: (NOTE) The Xpert Xpress SARS-CoV-2/FLU/RSV plus assay is intended as an aid in the diagnosis of influenza from Nasopharyngeal swab specimens and should not  be used as a sole basis for treatment. Nasal washings and aspirates are unacceptable for Xpert Xpress SARS-CoV-2/FLU/RSV testing.  Fact Sheet for Patients: EntrepreneurPulse.com.au  Fact Sheet for Healthcare Providers: IncredibleEmployment.be  This test is not yet approved or cleared by the [place] FDA and has been authorized for detection and/or diagnosis of SARS-CoV-2 by FDA under an Emergency Use Authorization (EUA). This EUA will remain in effect (meaning this test can be used) for the duration of the COVID-19 declaration under Section 564(b)(1) of the Act, 21 U.S.C. section 360bbb-3(b)(1), unless the authorization is terminated or revoked.     Resp Syncytial Virus by PCR NEGATIVE NEGATIVE Final    Comment: (NOTE) Fact Sheet for Patients: EntrepreneurPulse.com.au  Fact Sheet for Healthcare Providers: IncredibleEmployment.be  This test is not yet approved or cleared by the [place] FDA and has been authorized for detection and/or diagnosis of SARS-CoV-2 by FDA under an Emergency Use Authorization (EUA). This EUA will remain in effect (meaning this test can be used) for the duration of the COVID-19 declaration under Section 564(b)(1) of the Act, 21 U.S.C. section 360bbb-3(b)(1), unless the authorization is terminated or revoked.  Performed at [facility], [address], [place], [place] [address]   Blood Culture (routine x 2)     Status: None   Collection  Time: 07/15/22  7:15 PM   Specimen: BLOOD  Result Value Ref Range Status   Specimen Description BLOOD LEFT [facility]  Final   Special Requests   Final    BOTTLES DRAWN AEROBIC AND ANAEROBIC Blood Culture adequate volume   Culture   Final    NO GROWTH 5 DAYS Performed at [facility], [address] [place], [place] [address]    Report Status 07/20/2022 FINAL  Final  Blood Culture (routine x 2)     Status: None    Collection Time: 07/15/22  7:15 PM   Specimen: BLOOD  Result Value Ref Range Status   Specimen Description BLOOD RIGHT [facility]  Final   Special Requests   Final    BOTTLES DRAWN AEROBIC AND ANAEROBIC Blood Culture adequate volume   Culture   Final    NO GROWTH 5 DAYS Performed at [facility], [address], [place], [place] [address]    Report Status 07/20/2022 FINAL  Final  MRSA Next Gen by PCR, Nasal     Status: None   Collection Time: 07/16/22  9:50 AM   Specimen: Nasal Mucosa; Nasal Swab  Result Value Ref Range Status   MRSA by PCR Next Gen NOT DETECTED NOT DETECTED Final    Comment: (NOTE) The GeneXpert MRSA Assay (FDA approved for NASAL specimens only), is one component of a comprehensive MRSA colonization surveillance program. It is not intended to diagnose MRSA infection nor to guide or monitor treatment for MRSA infections. Test performance is not FDA approved in patients less than 52 years old. Performed at [facility], [address] [place], [place] [address]     Procedures and diagnostic studies:  No results found.             LOS: 6 days   Ivi Griffith  [facility]   Pager on [email or web address]. If 7PM-7AM, please contact night-coverage at [email or web address]     07/21/2022, 11:00 AM

## 2022-07-22 DIAGNOSIS — F17210 Nicotine dependence, cigarettes, uncomplicated: Secondary | ICD-10-CM | POA: Diagnosis not present

## 2022-07-22 DIAGNOSIS — F203 Undifferentiated schizophrenia: Secondary | ICD-10-CM | POA: Diagnosis not present

## 2022-07-22 DIAGNOSIS — D75839 Thrombocytosis, unspecified: Secondary | ICD-10-CM | POA: Clinically undetermined

## 2022-07-22 DIAGNOSIS — J189 Pneumonia, unspecified organism: Secondary | ICD-10-CM | POA: Diagnosis not present

## 2022-07-22 DIAGNOSIS — K5900 Constipation, unspecified: Secondary | ICD-10-CM

## 2022-07-22 DIAGNOSIS — J984 Other disorders of lung: Secondary | ICD-10-CM | POA: Diagnosis not present

## 2022-07-22 HISTORY — DX: Constipation, unspecified: K59.00

## 2022-07-22 NOTE — Progress Notes (Signed)
Nutrition Follow-up  DOCUMENTATION CODES:   Severe malnutrition in context of chronic illness  INTERVENTION:   -Continue with regular diet -Continue Ensure Enlive po TID, each supplement provides 350 kcal and 20 grams of protein -Continue Magic cup TID with meals, each supplement provides 290 kcal and 9 grams of protein  -Continue MVI with minerals daily  NUTRITION DIAGNOSIS:   Severe Malnutrition related to chronic illness (schizophrenia) as evidenced by moderate fat depletion, severe fat depletion, moderate muscle depletion, severe muscle depletion, percent weight loss.  Ongoing  GOAL:   Patient will meet greater than or equal to 90% of their needs  Progressing   MONITOR:   PO intake, Supplement acceptance  REASON FOR ASSESSMENT:   Malnutrition Screening Tool    ASSESSMENT:   Pt with medical history significant for Paranoid schizophrenia, hypertension, diagnosed with iron deficiency anemia in November 2023 for which he received Venofer x 1 by oncology with recommendation for further treatment and colonoscopy but subsequently lost to follow-up.  He presented from the group home with auditory hallucinations.  Reviewed I/O's: -800 ml x 24 hours and -10.3 L since admission  UOP: 800 ml x 24 hours   Per MD notes, AFB and quantiferon pending.   Spoke with pt at bedside, who looked overall better and more conversant with this RD. Pt reports that he has a fair appetite and consumed about 50% of his french toast this morning. Noted Ensure supplement at bedside, which pt consumed 100%. Pt reports he likes Ensure and has been drinking.   Case discussed with RN, who reports pt eats minimally at meals, but is always willing to drink Ensure. Pt has already consumed 2 supplements this morning.   Medications reviewed and include miralax.  Labs reviewed: Na: 134, CBGS: 132 (inpatient orders for glycemic control are none).    Diet Order:   Diet Order             Diet regular  Fluid consistency: Thin  Diet effective now                   EDUCATION NEEDS:   Education needs have been addressed  Skin:  Skin Assessment: Reviewed RN Assessment  Last BM:  07/22/21 (type 3)  Height:   Ht Readings from Last 1 Encounters:  07/15/22 6' (1.829 m)    Weight:   Wt Readings from Last 1 Encounters:  07/15/22 63.5 kg    Ideal Body Weight:  80.9 kg  BMI:  Body mass index is 18.99 kg/m.  Estimated Nutritional Needs:   Kcal:  2200-2400  Protein:  125-150 grams  Fluid:  > 2 L    Loistine Chance, RD, LDN, [place] Registered Dietitian II Certified Diabetes Care and Education Specialist Please refer to [facility] for RD and/or RD on-call/weekend/after hours pager

## 2022-07-22 NOTE — Assessment & Plan Note (Signed)
Continue Augmentin Follow up pending AFB smear x 3. Quaniferon negative. ID following - see their recs Supportive care per orders

## 2022-07-22 NOTE — Assessment & Plan Note (Signed)
Likely reactive. Monitor CBC.

## 2022-07-22 NOTE — Assessment & Plan Note (Signed)
Nicotine patch if withdrawal suspected.

## 2022-07-22 NOTE — Progress Notes (Signed)
Kenneth [name] for Infectious Disease  Date of Admission:  07/15/2022           Reason for visit: Follow up on cavitary pneumonia, lung mass  Current antibiotics: Augmentin  ASSESSMENT:    62 y.o. male admitted with:  Pulmonary left hilum mass, left midlung cavitary lesion, and opacification of the left lung: Differential includes malignancy, infectious pulmonary abscess, necrotic mass. Right lung pulmonary nodules: Differential including metastatic disease versus septic emboli. Tobacco use: Patient with ongoing tobacco use since the age of 55. Anemia: Prior history of iron deficiency anemia without prior endoscopic evaluation. Schizophrenia: Currently residing in a group home.  RECOMMENDATIONS:    Continue Augmentin Overall, suspicious for malignancy.  However, WBC also much improved with antibiotic therapy so suspect some component of pneumonia as well.  Pulmonary has evaluated and is deferring any workup to the outpatient setting TB rule out ongoing given radiographic findings and his presenting complaints consisting of cough, night sweats, weight loss, and residing in a group home.  AFB smear negative x 1.  Second AFB smear processing and awaiting third and final AFB smear.  QuantiFERON is negative.  Continue airborne precautions until 3 negative AFB smears have resulted. Discussed with RN who will submit third AFB specimen today Following   Principal Problem:   Schizophrenia, undifferentiated ([place]) Active Problems:   Tobacco use disorder   Paranoid schizophrenia ([place])   Bipolar 1 disorder ([place])   Hypertension   Pneumonia of left lung due to infectious organism   Sepsis ([place])   Chronic obstructive lung disease ([place])   Cavitating mass of lung/ lung abscess   Auditory hallucinations   Anemia   Esophageal abnormality   Septic embolism ([name])   Underweight   Collapse of left lung   Pericarditis   Abnormal CT scan, esophagus   Protein-calorie malnutrition,  severe    MEDICATIONS:    Scheduled Meds:  amoxicillin-clavulanate  1 tablet Oral Q12H   atorvastatin  20 mg Oral Daily   enoxaparin (LOVENOX) injection  40 mg Subcutaneous Q24H   feeding supplement  237 mL Oral TID BM   fluticasone furoate-vilanterol  1 puff Inhalation Daily   And   umeclidinium bromide  1 puff Inhalation Daily   hydrOXYzine  25 mg Oral TID   montelukast  10 mg Oral Daily   multivitamin with minerals  1 tablet Oral Daily   oxybutynin  10 mg Oral Daily   paliperidone  3 mg Oral Daily   PARoxetine  20 mg Oral Daily   polyethylene glycol  17 g Oral Daily   sorbitol, milk of mag, mineral oil, glycerin (SMOG) enema  960 mL Rectal Once   Continuous Infusions: PRN Meds:.acetaminophen **OR** acetaminophen, albuterol, HYDROcodone-acetaminophen, ondansetron **OR** ondansetron (ZOFRAN) IV, senna-docusate  SUBJECTIVE:   24 hour events:  No acute events  No new complaints.  Reports minimal cough.  No acute.  Tolerating antibiotics.  Review of Systems  All other systems reviewed and are negative.     OBJECTIVE:   Blood pressure 100/66, pulse 100, temperature 97.9 F (36.6 C), resp. rate 18, height 6' (1.829 m), weight 63.5 kg, SpO2 98 %. Body mass index is 18.99 kg/m.  Physical Exam Constitutional:      Appearance: Normal appearance.  HENT:     Head: Normocephalic and atraumatic.  Abdominal:     General: There is no distension.     Palpations: Abdomen is soft.  Musculoskeletal:  General: Normal range of motion.     Cervical back: Normal range of motion and neck supple.  Skin:    General: Skin is warm and dry.  Neurological:     General: No focal deficit present.     Mental Status: He is alert and oriented to person, place, and time.  Psychiatric:        Mood and Affect: Mood normal.        Behavior: Behavior normal.      Lab Results: Lab Results  Component Value Date   WBC 14.3 (H) 07/21/2022   HGB 10.1 (L) 07/21/2022   HCT 32.4 (L)  07/21/2022   MCV 82.2 07/21/2022   PLT 648 (H) 07/21/2022    Lab Results  Component Value Date   NA 134 (L) 07/17/2022   K 3.6 07/17/2022   CO2 27 07/17/2022   GLUCOSE 103 (H) 07/17/2022   BUN 10 07/17/2022   CREATININE 0.66 07/17/2022   CALCIUM 8.9 07/17/2022   GFRNONAA >60 07/17/2022   GFRAA >60 11/21/2018    Lab Results  Component Value Date   ALT 36 07/16/2022   AST 23 07/16/2022   ALKPHOS 94 07/16/2022   BILITOT 0.5 07/16/2022    No results found for: "CRP"  No results found for: "ESRSEDRATE"   I have reviewed the micro and lab results in Epic.  Imaging: No results found.   Imaging independently reviewed in Epic.    Raynelle Highland for Infectious Disease [facility] [phone] pager 07/22/2022, 11:39 AM

## 2022-07-22 NOTE — Assessment & Plan Note (Signed)
Bowel regimen per orders. Resolved. Had BM on 2/4

## 2022-07-22 NOTE — Assessment & Plan Note (Signed)
Related to chronic illness (schizophrenia) as evidenced by moderate fat depletion, severe fat depletion, moderate muscle depletion, severe muscle depletion, percent weight loss.  --Appreciate dietitian recommendations --Started on liberalized diet, Ensure drinks, Magic cups TID, multivitamins

## 2022-07-22 NOTE — Progress Notes (Addendum)
Progress Note   Patient: Kenneth [name]. VQM:[id number] DOB: [birth year] DOA: 07/15/2022     7 DOS: the patient was seen and examined on 07/22/2022   Brief hospital course: Kenneth [name]. is a 62 y.o. male with medical history significant for Paranoid schizophrenia, hypertension, diagnosed with iron deficiency anemia in November 2023 for which he received Venofer x 1 by oncology with recommendation for further treatment and colonoscopy but subsequently lost to follow-up.  He presented to the emergency department (from the group home) with auditory hallucinations.  He is on Invega injections for schizophrenia.   In the emergency department reviewed sepsis, pneumonia, cavitary lesion suspicious for abscess versus necrotic mass.  He was also found to have esophageal wall thickening and pericarditis on CT chest.  He was started on IV fluids and empiric IV antibiotics.  Further details of hospital course and management as below.  Assessment and Plan: * Schizophrenia, undifferentiated ([place]) .  Sepsis ([place]) Septic embolism Patient with multiple foci of infection versus metastasis including lungs, kidneys, pericardium Sepsis criteria include tachycardia, hypotension, leukocytosis of 27,000 Primary source appears is pulmonary (pneumonia) Received Sepsis protocol fluids and IV broad spectrum antibiotics. --Follow blood cultures --ID following  Septic embolism ([name]) Versus metastatic disease.   Mgmt as outlined  Collapse of left lung See "Cavitating mass..."  Cavitating mass of lung/ lung abscess Collapse of left lung No significant respiratory complaints aside from mild cough. Etiology TBD - infectious vs malignancy given all the imaging findings. Treated initially with broad spectrum IV antibiotics >> Unasyn --Now on Augmentin - continue --ID is following --Pulmonology consulted, signed off. They plan for outpatient evaluation, PET scan recommended. --Aspiration  precautions --Speech therapy evaluation --Supplemental oxygen if needed --Antitussives, albuterol as needed and supportive care --Follow pending AFB smear x 3  Acute pyelonephritis CT showed findings concerning for pyelonephritis. Early renal abscesses are not excluded. Continue aforementioned antibiotics Follow-up urinalysis ID consult  Pericarditis CT shows thickening and hyperenhancement of the visceral and parietal pericardium with central low attenuation fluid compatible with pericarditis.Marland KitchenMarland KitchenDifferential consideration includes septic emboli or metastases. Echocardiogram 07/16/22 showed LVEF 93-26%, grade 1 diastolic dysfunction. Pt seems asymptomatic, no chest pain complaints or dyspnea. --Consider cardiology consult  --Will pursue biopsy by EGD this admission  Esophageal abnormality CT showed question circumferential lower esophageal wall thickening. Needs biopsy by EGD. --Will consult GI once TB definitively ruled out (awaiting AFB smear x 3)   Anemia Suspect related to underlying malignancy He was given IV Venofer 200 mg in November for incidental finding of anemia on routine blood work. Hemoglobin 10.1 on admission, down from 12.3 in Nov 2023. --Continue to trend hemoglobin  --Will defer iron infusions given infection --Hematology consult vs follow up  Hyponatremia Sodium 130.  Suspect malignancy Oncology consult in the a.m.  Underweight Patient had a drop in BMI from 22.65 a couple months prior to 18.99 based on chart review High suspicion for malignancy  Auditory hallucinations Hx of Schizophrenia, undifferentiated Patient appears stable at this time Seen by psyhciatry.  Per their NP: "He can continue the oral Invega and follow-up with his outpatient provider. Per the group home, his initial dose of [name] is due Feb. 08".    Constipation Bowel regimen per orders. Resolved. Had BM on 2/4  Thrombocytosis Likely reactive. Monitor CBC.  Protein-calorie  malnutrition, severe Related to chronic illness (schizophrenia) as evidenced by moderate fat depletion, severe fat depletion, moderate muscle depletion, severe muscle depletion, percent weight loss.  --Appreciate dietitian  recommendations --Started on liberalized diet, Ensure drinks, Magic cups TID, multivitamins  Abnormal CT scan, esophagus Per CT report: "Question circumferential lower esophageal wall thickening. Underlying neoplasm is difficult to exclude." -- Plan to consult GI for EGD / biopsies once AFB smears x 3 result   Chronic obstructive lung disease ([place]) Stable.  Continue home inhaler DuoNebs as needed  Pneumonia of left lung due to infectious organism Continue Augmentin Follow up pending AFB smear x 3. Quaniferon negative. ID following - see their recs Supportive care per orders  Hypertension Hypotension Patient was previously on HCTZ - held. Systolic in the 67H so hold off any antihypertensives and continue to monitor   Bipolar 1 disorder ([place]) Continue current regimen per psych.  Paranoid schizophrenia ([place]) See auditory hallucinations  Tobacco use disorder Nicotine patch if withdrawal suspected.         Subjective: Pt seen awake resting in bed.  Reports occasional cough, collecting sputum samples as requested.  He denies any other acute complaints, states just tired. Denies fever/sweats/chills, chest pain or shortness of breath.  Physical Exam: Vitals:   07/21/22 1516 07/21/22 2010 07/22/22 0449 07/22/22 0749  BP: 113/70 104/64 99/69 100/66  Pulse: (!) 109 98 100 100  Resp: 20 18 18 18   Temp: 97.9 F (36.6 C) 97.8 F (36.6 C) 98.3 F (36.8 C) 97.9 F (36.6 C)  TempSrc:      SpO2: 97% 99% 97% 98%  Weight:      Height:        General exam: awake, alert, no acute distress, underweight HEENT: atraumatic, clear conjunctiva, anicteric sclera, moist mucus membranes, hearing grossly normal  Respiratory system: CTAB with diminished bases, no  wheezes, rales or rhonchi, normal respiratory effort. Cardiovascular system: normal S1/S2, RRR, no JVD, murmurs, rubs, gallops, no pedal edema.   Gastrointestinal system: soft, NT, ND, no HSM felt, +bowel sounds. Central nervous system: A&O x self hospital. no gross focal neurologic deficits, normal speech Extremities: moves all, no edema, normal tone Skin: dry, intact, normal temperature Psychiatry: normal mood, congruent affect, judgement and insight appear normal   Data Reviewed:  Notable labs --- WBC 14.3 from 12.6, Hbg 10.1 from 9.8.  Platelets 648k with left shift.  Family Communication: None  Disposition: Status is: Inpatient Remains inpatient appropriate because: Ongoing evaluation including EGD required once TB definitively ruled out.   Planned Discharge Destination:  Group Home    Time spent: 42 minutes  Author: Ezekiel Slocumb, DO 07/22/2022 1:46 PM  For on call review [email or web address].

## 2022-07-22 NOTE — Assessment & Plan Note (Signed)
See "Cavitating mass.Marland KitchenMarland Kitchen"

## 2022-07-22 NOTE — Assessment & Plan Note (Addendum)
Per CT report: "Question circumferential lower esophageal wall thickening. Underlying neoplasm is difficult to exclude." -- Updated GI re negative AFB smears x 3 --Plan for EGD biopsies hopefully tomorrow

## 2022-07-22 NOTE — Hospital Course (Signed)
Kenneth [name]. is a 62 y.o. male with medical history significant for Paranoid schizophrenia, hypertension, diagnosed with iron deficiency anemia in November 2023 for which he received Venofer x 1 by oncology with recommendation for further treatment and colonoscopy but subsequently lost to follow-up.  He presented to the emergency department (from the group home) with auditory hallucinations.  He is on Invega injections for schizophrenia.   In the emergency department reviewed sepsis, pneumonia, cavitary lesion suspicious for abscess versus necrotic mass.  He was also found to have esophageal wall thickening and pericarditis on CT chest.  He was started on IV fluids and empiric IV antibiotics.  Further details of hospital course and management as below.

## 2022-07-22 NOTE — Assessment & Plan Note (Signed)
See auditory hallucinations

## 2022-07-22 NOTE — Assessment & Plan Note (Signed)
Continue current regimen per psych.

## 2022-07-22 NOTE — Assessment & Plan Note (Addendum)
Versus metastatic disease.   Mgmt as outlined

## 2022-07-23 DIAGNOSIS — J850 Gangrene and necrosis of lung: Secondary | ICD-10-CM | POA: Diagnosis not present

## 2022-07-23 DIAGNOSIS — F17210 Nicotine dependence, cigarettes, uncomplicated: Secondary | ICD-10-CM | POA: Diagnosis not present

## 2022-07-23 DIAGNOSIS — E871 Hypo-osmolality and hyponatremia: Secondary | ICD-10-CM

## 2022-07-23 DIAGNOSIS — F203 Undifferentiated schizophrenia: Secondary | ICD-10-CM | POA: Diagnosis not present

## 2022-07-23 DIAGNOSIS — J189 Pneumonia, unspecified organism: Secondary | ICD-10-CM | POA: Diagnosis not present

## 2022-07-23 DIAGNOSIS — J984 Other disorders of lung: Secondary | ICD-10-CM | POA: Diagnosis not present

## 2022-07-23 LAB — OSMOLALITY: Osmolality: 287 mOsm/kg (ref 275–295)

## 2022-07-23 LAB — SODIUM, URINE, RANDOM: Sodium, Ur: 16 mmol/L

## 2022-07-23 LAB — CBC
HCT: 31.4 % — ABNORMAL LOW (ref 39.0–52.0)
Hemoglobin: 9.9 g/dL — ABNORMAL LOW (ref 13.0–17.0)
MCH: 25.9 pg — ABNORMAL LOW (ref 26.0–34.0)
MCHC: 31.5 g/dL (ref 30.0–36.0)
MCV: 82.2 fL (ref 80.0–100.0)
Platelets: 587 10*3/uL — ABNORMAL HIGH (ref 150–400)
RBC: 3.82 MIL/uL — ABNORMAL LOW (ref 4.22–5.81)
RDW: 15.2 % (ref 11.5–15.5)
WBC: 15.7 10*3/uL — ABNORMAL HIGH (ref 4.0–10.5)
nRBC: 0 % (ref 0.0–0.2)

## 2022-07-23 LAB — BASIC METABOLIC PANEL WITH GFR
Anion gap: 6 (ref 5–15)
BUN: 21 mg/dL — ABNORMAL HIGH (ref 6–20)
CO2: 30 mmol/L (ref 22–32)
Calcium: 10.4 mg/dL — ABNORMAL HIGH (ref 8.9–10.3)
Chloride: 96 mmol/L — ABNORMAL LOW (ref 98–111)
Creatinine, Ser: 0.79 mg/dL (ref 0.61–1.24)
GFR, Estimated: >60 mL/min
Glucose, Bld: 96 mg/dL (ref 70–99)
Potassium: 3.9 mmol/L (ref 3.5–5.1)
Sodium: 132 mmol/L — ABNORMAL LOW (ref 135–145)

## 2022-07-23 LAB — OSMOLALITY, URINE: Osmolality, Ur: 420 mOsm/kg (ref 300–900)

## 2022-07-23 LAB — PHOSPHORUS: Phosphorus: 3.3 mg/dL (ref 2.5–4.6)

## 2022-07-23 LAB — MAGNESIUM: Magnesium: 2 mg/dL (ref 1.7–2.4)

## 2022-07-23 MED ORDER — INFLUENZA VAC SPLIT QUAD 0.5 ML IM SUSY
0.5000 mL | PREFILLED_SYRINGE | INTRAMUSCULAR | Status: AC
  Administered 2022-07-24: 0.5 mL via INTRAMUSCULAR
  Filled 2022-07-23: qty 0.5

## 2022-07-23 MED ORDER — SODIUM CHLORIDE 0.9 % IV SOLN: INTRAVENOUS | Status: AC

## 2022-07-23 NOTE — Progress Notes (Signed)
Date of Admission:  07/15/2022      ID: Kenneth [name]. is a 62 y.o. male Principal Problem:   Schizophrenia, undifferentiated ([place]) Active Problems:   Tobacco use disorder   Paranoid schizophrenia ([place])   Bipolar 1 disorder ([place])   Hypertension   Pneumonia of left lung due to infectious organism   Sepsis ([place])   Chronic obstructive lung disease ([place])   Cavitating mass of lung/ lung abscess   Auditory hallucinations   Hyponatremia   Anemia   Esophageal abnormality   Septic embolism ([name])   Underweight   Collapse of left lung   Pericarditis   Abnormal CT scan, esophagus   Protein-calorie malnutrition, severe   Thrombocytosis   Constipation   Hypercalcemia    Subjective: Was admitted with auditory hallucination and also some visual hallucination and weakness In the ED vitals   07/15/22  BP 97/59 !  Temp 98.3 F (36.8 C)  Pulse Rate 93  Resp 18  SpO2 99 %  Weight 140 lb    Latest Reference Range & Units 07/15/22  WBC 4.0 - 10.5 K/uL 27.4 (H)  Hemoglobin 13.0 - 17.0 g/dL 10.1 (L)  HCT 39.0 - 52.0 % 31.3 (L)  Platelets 150 - 400 K/uL 610 (H)  Creatinine 0.61 - 1.24 mg/dL 0.77   CXR showed new complete opacification of left hemithorax HE has had some productive cough , but no chest pain or Sob Some weight loss but no fever or night sweats HE lives in a group home , so three sputums have been sent for AFB He was seen By Dr.Wallace ID and he asked for Bronchoscopy to r/o malignancy-pulmonary seen patient and they plan to investigate him as OP  Subjective Says he is okay- neither worse nor better Medications:   amoxicillin-clavulanate  1 tablet Oral Q12H   atorvastatin  20 mg Oral Daily   enoxaparin (LOVENOX) injection  40 mg Subcutaneous Q24H   feeding supplement  237 mL Oral TID BM   fluticasone furoate-vilanterol  1 puff Inhalation Daily   And   umeclidinium bromide  1 puff Inhalation Daily   hydrOXYzine  25 mg Oral TID   [START ON 07/24/2022]  influenza vac split quadrivalent PF  0.5 mL Intramuscular Tomorrow-1000   montelukast  10 mg Oral Daily   multivitamin with minerals  1 tablet Oral Daily   oxybutynin  10 mg Oral Daily   paliperidone  3 mg Oral Daily   PARoxetine  20 mg Oral Daily   polyethylene glycol  17 g Oral Daily   sorbitol, milk of mag, mineral oil, glycerin (SMOG) enema  960 mL Rectal Once    Objective: Vital signs in last 24 hours: Temp:  [98.4 F (36.9 C)-99.1 F (37.3 C)] 98.9 F (37.2 C) (02/08 0753) Pulse Rate:  [85-110] 85 (02/08 0753) Resp:  [18-20] 18 (02/08 0753) BP: (102-130)/(65-95) 102/72 (02/08 0753) SpO2:  [96 %-98 %] 96 % (02/08 0753)   PHYSICAL EXAM:  General: Alert, cooperative, no distress, appears stated age.  Lungs: b/l air entry- much decreased left side Heart: Regular rate and rhythm, no murmur, rub or gallop. Abdomen: Soft, non-tender,not distended. Bowel sounds normal. No masses Extremities: atraumatic, no cyanosis. No edema. No clubbing Skin: No rashes or lesions. Or bruising Lymph: Cervical, supraclavicular normal. Neurologic: Grossly non-focal  Lab Results Recent Labs    07/21/22 0746 07/23/22 0416  WBC 14.3* 15.7*  HGB 10.1* 9.9*  HCT 32.4* 31.4*  NA  --  132*  K  --  3.9  CL  --  96*  CO2  --  30  BUN  --  21*  CREATININE  --  0.79   Microbiology: Sputum AFB X 3 sent Studies/Results:   Assessment/Plan: Necrotizing /cavitary mass left lung- infection VS malignancy Needs bronchoscopy Sputum AFB has been sent- result pending Currently on Augmentin Sputum for culture Will discuss with pulmonologist regarding bronch/thoracentesis?  Schizophrenia on meds  Discussed the management with patient and care team

## 2022-07-23 NOTE — Progress Notes (Signed)
Progress Note   Patient: Kenneth [name]. FAO:[id number] DOB: May 09, 1961 DOA: 07/15/2022     8 DOS: the patient was seen and examined on 07/23/2022   Brief hospital course: Kenneth [name]. is a 62 y.o. male with medical history significant for Paranoid schizophrenia, hypertension, diagnosed with iron deficiency anemia in November 2023 for which he received Venofer x 1 by oncology with recommendation for further treatment and colonoscopy but subsequently lost to follow-up.  He presented to the emergency department (from the group home) with auditory hallucinations.  He is on Invega injections for schizophrenia.   In the emergency department reviewed sepsis, pneumonia, cavitary lesion suspicious for abscess versus necrotic mass.  He was also found to have esophageal wall thickening and pericarditis on CT chest.  He was started on IV fluids and empiric IV antibiotics.  Further details of hospital course and management as below.  Assessment and Plan: * Schizophrenia, undifferentiated ([name]) .  Sepsis ([name]) Septic embolism Patient with multiple foci of infection versus metastasis including lungs, kidneys, pericardium Sepsis criteria include tachycardia, hypotension, leukocytosis of 27,000 Primary source appears is pulmonary (pneumonia) Received Sepsis protocol fluids and IV broad spectrum antibiotics. --Follow blood cultures --ID following  Septic embolism ([name]) Versus metastatic disease.   Mgmt as outlined  Collapse of left lung See "Cavitating mass..."  Cavitating mass of lung/ lung abscess Collapse of left lung No significant respiratory complaints aside from mild cough. Etiology TBD - infectious vs malignancy given all the imaging findings. Treated initially with broad spectrum IV antibiotics >> Unasyn --Now on Augmentin - continue --ID is following --Pulmonology consulted, signed off. They plan for outpatient evaluation, PET scan recommended. --Aspiration  precautions --Speech therapy evaluation --Supplemental oxygen if needed --Antitussives, albuterol as needed and supportive care --Follow pending AFB smear x 3  Acute pyelonephritis CT showed findings concerning for pyelonephritis. Early renal abscesses are not excluded. Continue aforementioned antibiotics Follow-up urinalysis ID consult  Pericarditis CT shows thickening and hyperenhancement of the visceral and parietal pericardium with central low attenuation fluid compatible with pericarditis.Marland KitchenMarland KitchenDifferential consideration includes septic emboli or metastases. Echocardiogram 07/16/22 showed LVEF 69-62%, grade 1 diastolic dysfunction. Pt seems asymptomatic, no chest pain complaints or dyspnea. --Consider cardiology consult  --Will pursue biopsy by EGD this admission  Esophageal abnormality CT showed question circumferential lower esophageal wall thickening. Needs biopsy by EGD. --Will consult GI once TB definitively ruled out (awaiting AFB smear x 3)   Anemia Suspect related to underlying malignancy He was given IV Venofer 200 mg in November for incidental finding of anemia on routine blood work. Hemoglobin 10.1 on admission, down from 12.3 in Nov 2023. --Continue to trend hemoglobin  --Will defer iron infusions given infection --Hematology consult vs follow up  Hyponatremia Sodium trending down 136 >> 134 >> 132. Suspect hypovolemia, unclear how much he is eating and drinking.  Also potential malignancy is a concern. --check serum Osm, urine Osm & urine sodium for now --fluid challenge with NS @ 75 cc/hr x 1 day --repeat BMP in AM  Underweight Patient had a drop in BMI from 22.65 a couple months prior to 18.99 based on chart review High suspicion for malignancy  Auditory hallucinations Hx of Schizophrenia, undifferentiated Patient appears stable at this time Seen by psyhciatry.  Per their NP: "He can continue the oral Invega and follow-up with his outpatient provider. Per  the group home, his initial dose of [name] is due Feb. 08".    Constipation Bowel regimen per orders. Resolved. Had  BM on 2/4  Thrombocytosis Likely reactive. Monitor CBC.  Protein-calorie malnutrition, severe Related to chronic illness (schizophrenia) as evidenced by moderate fat depletion, severe fat depletion, moderate muscle depletion, severe muscle depletion, percent weight loss.  --Appreciate dietitian recommendations --Started on liberalized diet, Ensure drinks, Magic cups TID, multivitamins  Abnormal CT scan, esophagus Per CT report: "Question circumferential lower esophageal wall thickening. Underlying neoplasm is difficult to exclude." -- Plan to consult GI for EGD / biopsies once AFB smears x 3 result   Chronic obstructive lung disease ([name]) Stable.  Continue home inhaler DuoNebs as needed  Pneumonia of left lung due to infectious organism Continue Augmentin Follow up pending AFB smear x 3. Quaniferon negative. ID following - see their recs Supportive care per orders  Hypertension Hypotension Patient was previously on HCTZ - held. BP's are controlled, soft at times. --Continue holding HCTZ   Bipolar 1 disorder ([name]) Continue current regimen per psych.  Paranoid schizophrenia ([name]) See auditory hallucinations  Tobacco use disorder Nicotine patch if withdrawal suspected.         Subjective: Pt seen awake resting in bed.  He reports occasional cough, no fever/chills.  No other acute complaints.    Physical Exam: Vitals:   07/22/22 2100 07/23/22 0430 07/23/22 0433 07/23/22 0753  BP: 104/65 (!) 107/95 130/76 102/72  Pulse: (!) 104 (!) 106 92 85  Resp: 20 20 19 18   Temp: 99.1 F (37.3 C) 98.6 F (37 C) 98.8 F (37.1 C) 98.9 F (37.2 C)  TempSrc:      SpO2: 97% 97% 98% 96%  Weight:      Height:        General exam: awake, alert, no acute distress, underweight HEENT: moist mucus membranes, hearing grossly normal  Respiratory system:  CTAB with diminished bases, no wheezes, rales or rhonchi, normal respiratory effort. Cardiovascular system: normal S1/S2, RRR, no peripheral edema.   Gastrointestinal system: soft, NT, ND Central nervous system: A&O x self hospital. no gross focal neurologic deficits, normal speech Extremities: moves all, no edema, normal tone Skin: dry, intact, normal temperature Psychiatry: normal mood, congruent affect   Data Reviewed:  Notable labs --- WBC 12.6 >> 14.3 >> 15.7 with left shift, Hbg 9.9 from 10.1.  Platelets 578k from 648k   Family Communication: None  Disposition: Status is: Inpatient Remains inpatient appropriate because: Ongoing evaluation including EGD required once TB definitively ruled out (awaiting AFB smears x 3 to result).   Planned Discharge Destination:  Group Home    Time spent: 36 minutes  Author: Ezekiel Slocumb, DO 07/23/2022 12:22 PM  For on call review [email or web address].

## 2022-07-23 NOTE — Assessment & Plan Note (Addendum)
Initial Ca on admission was 8.9 >> 10.4 (on 2/8).  Suspect dehydration given sodium also trending down.   Malignancy is a concern, however this is acute elevation of Ca.   --Monitor off IV fluids  --Monitor BMP --check PTH, PTHrP - pending

## 2022-07-24 DIAGNOSIS — J984 Other disorders of lung: Secondary | ICD-10-CM | POA: Diagnosis not present

## 2022-07-24 DIAGNOSIS — J9811 Atelectasis: Secondary | ICD-10-CM | POA: Diagnosis not present

## 2022-07-24 DIAGNOSIS — J850 Gangrene and necrosis of lung: Secondary | ICD-10-CM | POA: Diagnosis not present

## 2022-07-24 DIAGNOSIS — F203 Undifferentiated schizophrenia: Secondary | ICD-10-CM | POA: Diagnosis not present

## 2022-07-24 DIAGNOSIS — J189 Pneumonia, unspecified organism: Secondary | ICD-10-CM | POA: Diagnosis not present

## 2022-07-24 DIAGNOSIS — F17210 Nicotine dependence, cigarettes, uncomplicated: Secondary | ICD-10-CM | POA: Diagnosis not present

## 2022-07-24 LAB — BASIC METABOLIC PANEL WITH GFR
Anion gap: 7 (ref 5–15)
BUN: 21 mg/dL — ABNORMAL HIGH (ref 6–20)
CO2: 28 mmol/L (ref 22–32)
Calcium: 9.9 mg/dL (ref 8.9–10.3)
Chloride: 97 mmol/L — ABNORMAL LOW (ref 98–111)
Creatinine, Ser: 0.87 mg/dL (ref 0.61–1.24)
GFR, Estimated: >60 mL/min
Glucose, Bld: 142 mg/dL — ABNORMAL HIGH (ref 70–99)
Potassium: 3.8 mmol/L (ref 3.5–5.1)
Sodium: 132 mmol/L — ABNORMAL LOW (ref 135–145)

## 2022-07-24 LAB — CBC
HCT: 28.7 % — ABNORMAL LOW (ref 39.0–52.0)
Hemoglobin: 9 g/dL — ABNORMAL LOW (ref 13.0–17.0)
MCH: 25.9 pg — ABNORMAL LOW (ref 26.0–34.0)
MCHC: 31.4 g/dL (ref 30.0–36.0)
MCV: 82.5 fL (ref 80.0–100.0)
Platelets: 536 K/uL — ABNORMAL HIGH (ref 150–400)
RBC: 3.48 MIL/uL — ABNORMAL LOW (ref 4.22–5.81)
RDW: 15.2 % (ref 11.5–15.5)
WBC: 13.7 K/uL — ABNORMAL HIGH (ref 4.0–10.5)
nRBC: 0 % (ref 0.0–0.2)

## 2022-07-24 NOTE — Progress Notes (Signed)
   Date of Admission:  07/15/2022      ID: Kenneth Pluck. is a 62 y.o. male Principal Problem:   Schizophrenia, undifferentiated ([name]) Active Problems:   Tobacco use disorder   Paranoid schizophrenia ([name])   Bipolar 1 disorder ([name])   Hypertension   Pneumonia of left lung due to infectious organism   Sepsis ([name])   Chronic obstructive lung disease ([name])   Cavitating mass of lung/ lung abscess   Auditory hallucinations   Hyponatremia   Anemia   Esophageal abnormality   Septic embolism ([name])   Underweight   Collapse of left lung   Pericarditis   Abnormal CT scan, esophagus   Protein-calorie malnutrition, severe   Thrombocytosis   Constipation   Hypercalcemia   Necrotizing pneumonia ([name])    Subjective: Was admitted with auditory hallucination and also some visual hallucination and weakness  Pt is doing okay O cough or sob No chest pain Medications:   amoxicillin-clavulanate  1 tablet Oral Q12H   atorvastatin  20 mg Oral Daily   enoxaparin (LOVENOX) injection  40 mg Subcutaneous Q24H   feeding supplement  237 mL Oral TID BM   fluticasone furoate-vilanterol  1 puff Inhalation Daily   And   umeclidinium bromide  1 puff Inhalation Daily   hydrOXYzine  25 mg Oral TID   montelukast  10 mg Oral Daily   multivitamin with minerals  1 tablet Oral Daily   oxybutynin  10 mg Oral Daily   paliperidone  3 mg Oral Daily   PARoxetine  20 mg Oral Daily   polyethylene glycol  17 g Oral Daily   sorbitol, milk of mag, mineral oil, glycerin (SMOG) enema  960 mL Rectal Once    Objective: Vital signs in last 24 hours: Temp:  [98.4 F (36.9 C)-99 F (37.2 C)] 98.9 F (37.2 C) (02/09 0847) Pulse Rate:  [95-105] 100 (02/09 0847) Resp:  [16-20] 17 (02/09 0847) BP: (95-109)/(62-69) 103/66 (02/09 0847) SpO2:  [96 %-97 %] 97 % (02/09 0847)   PHYSICAL EXAM:  General: Alert, cooperative, no distress, appears stated age.  Lungs: b/l air entry- much decreased left side Heart:  Regular rate and rhythm, no murmur, rub or gallop. Abdomen: Soft, non-tender,not distended. Bowel sounds normal. No masses Extremities: atraumatic, no cyanosis. No edema. No clubbing Skin: No rashes or lesions. Or bruising Lymph: Cervical, supraclavicular normal. Neurologic: Grossly non-focal  Lab Results Recent Labs    07/23/22 0416 07/24/22 0346  WBC 15.7* 13.7*  HGB 9.9* 9.0*  HCT 31.4* 28.7*  NA 132* 132*  K 3.9 3.8  CL 96* 97*  CO2 30 28  BUN 21* 21*  CREATININE 0.79 0.87   Microbiology: Sputum AFB X 3 sent Studies/Results:    complete opacification of left hemithorax Assessment/Plan: Necrotizing /cavitary mass left lung- malignancy VS infection Could have post obstructive pneumonia Needs bronchoscopy Sputum AFB has been sent- result pending If 3 neg then he can be discharged on PO augmentin for 1 month and follow up with Dr.Aleskerov Currently on Augmentin discussed with pulmonologist regarding bronch  Schizophrenia on meds  Discussed the management with patient and care team  ID will follow him peripherally this weekend-call if needed

## 2022-07-24 NOTE — Progress Notes (Signed)
PT Cancellation Note  Patient Details Name: Kenneth [name]. MRN: [id number] DOB: 07/13/1960   Cancelled Treatment:    Reason Eval/Treat Not Completed: PT screened, no needs identified, will sign off. Per OT, patient is independent, no needs.    Madalyn Legner 07/24/2022, 10:56 AM

## 2022-07-24 NOTE — Progress Notes (Signed)
Progress Note   Patient: Kenneth [name]. WRU:[id number] DOB: [birth year] DOA: 07/15/2022     9 DOS: the patient was seen and examined on 07/24/2022   Brief hospital course: Kenneth [name]. is a 62 y.o. male with medical history significant for Paranoid schizophrenia, hypertension, diagnosed with iron deficiency anemia in November 2023 for which he received Venofer x 1 by oncology with recommendation for further treatment and colonoscopy but subsequently lost to follow-up.  He presented to the emergency department (from the group home) with auditory hallucinations.  He is on Invega injections for schizophrenia.   In the emergency department reviewed sepsis, pneumonia, cavitary lesion suspicious for abscess versus necrotic mass.  He was also found to have esophageal wall thickening and pericarditis on CT chest.  He was started on IV fluids and empiric IV antibiotics.  Further details of hospital course and management as below.  Assessment and Plan: * Schizophrenia, undifferentiated ([place]) .  Sepsis ([place]) Septic embolism Patient with multiple foci of infection versus metastasis including lungs, kidneys, pericardium Sepsis criteria include tachycardia, hypotension, leukocytosis of 27,000 Primary source appears is pulmonary (pneumonia) Received Sepsis protocol fluids and IV broad spectrum antibiotics. --Follow blood cultures --ID following  Septic embolism ([name]) Versus metastatic disease.   Mgmt as outlined  Collapse of left lung See "Cavitating mass..."  Cavitating mass of lung/ lung abscess Collapse of left lung No significant respiratory complaints aside from mild cough. Etiology TBD - infectious vs malignancy given all the imaging findings. Treated initially with broad spectrum IV antibiotics >> Unasyn --Now on Augmentin - continue --ID is following --Pulmonology consulted, signed off. They plan for outpatient evaluation, PET scan recommended. --Aspiration  precautions --Speech therapy evaluation --Supplemental oxygen if needed --Antitussives, albuterol as needed and supportive care --Follow pending AFB smear x 3  Acute pyelonephritis CT showed findings concerning for pyelonephritis. Early renal abscesses are not excluded. Continue aforementioned antibiotics Follow-up urinalysis ID consult  Pericarditis CT shows thickening and hyperenhancement of the visceral and parietal pericardium with central low attenuation fluid compatible with pericarditis.Marland KitchenMarland KitchenDifferential consideration includes septic emboli or metastases. Echocardiogram 07/16/22 showed LVEF 91-47%, grade 1 diastolic dysfunction. Pt seems asymptomatic, no chest pain complaints or dyspnea. --Consider cardiology consult  --Will pursue biopsy by EGD this admission  Esophageal abnormality CT showed question circumferential lower esophageal wall thickening. Needs biopsy by EGD. --Will consult GI once TB definitively ruled out (awaiting AFB smear x 3)   Anemia Suspect related to underlying malignancy He was given IV Venofer 200 mg in November for incidental finding of anemia on routine blood work. Hemoglobin 10.1 on admission, down from 12.3 in Nov 2023. --Continue to trend hemoglobin  --Will defer iron infusions given infection --Hematology consult vs follow up  Hyponatremia Sodium trending down 136 >> 134 >> 132. Suspect hypovolemia, unclear how much he is eating and drinking.  Also potential malignancy is a concern. --check serum Osm, urine Osm & urine sodium for now --fluid challenge with NS @ 75 cc/hr x 1 day --repeat BMP in AM  Underweight Patient had a drop in BMI from 22.65 a couple months prior to 18.99 based on chart review High suspicion for malignancy  Auditory hallucinations Hx of Schizophrenia, undifferentiated Patient appears stable at this time Seen by psyhciatry.  Per their NP: "He can continue the oral Invega and follow-up with his outpatient provider. Per  the group home, his initial dose of [name] is due Feb. 08".    Hypercalcemia Initial Ca on admission was 8.9,  today Ca is 10.4 (on 2/8).  Suspect dehydration given sodium also trending down.  Malignancy is a concern, however this is acute elevation of Ca.   --IV fluids as per hyponatremia --Monitor BMP --check PTH  Constipation Bowel regimen per orders. Resolved. Had BM on 2/4  Thrombocytosis Likely reactive. Monitor CBC.  Protein-calorie malnutrition, severe Related to chronic illness (schizophrenia) as evidenced by moderate fat depletion, severe fat depletion, moderate muscle depletion, severe muscle depletion, percent weight loss.  --Appreciate dietitian recommendations --Started on liberalized diet, Ensure drinks, Magic cups TID, multivitamins  Abnormal CT scan, esophagus Per CT report: "Question circumferential lower esophageal wall thickening. Underlying neoplasm is difficult to exclude." -- Plan to consult GI for EGD / biopsies once AFB smears x 3 result   Chronic obstructive lung disease ([place]) Stable.  Continue home inhaler DuoNebs as needed  Pneumonia of left lung due to infectious organism Continue Augmentin Follow up pending AFB smear x 3. Quaniferon negative. ID following - see their recs Supportive care per orders  Hypertension Hypotension Patient was previously on HCTZ - held. BP's are controlled, soft at times. --Continue holding HCTZ   Bipolar 1 disorder ([place]) Continue current regimen per psych.  Paranoid schizophrenia ([place]) See auditory hallucinations  Tobacco use disorder Nicotine patch if withdrawal suspected.    PT/OT evaluation ordered -- pt from group home, will need to be independent to return there, otherwise will need SNF placement.     Subjective: Pt seen awake resting in bed.  He denies complaints, reports feeling okay.  Occasional cough, denies fever/chills.  Physical Exam: Vitals:   07/23/22 2035 07/24/22 0514  07/24/22 0755 07/24/22 0847  BP:  109/64 106/69 103/66  Pulse: 95 99 100 100  Resp:  20 18 17   Temp:  98.4 F (36.9 C) 98.8 F (37.1 C) 98.9 F (37.2 C)  TempSrc:   Oral   SpO2:  97% 96% 97%  Weight:      Height:        General exam: awake, alert, no acute distress, underweight HEENT: moist mucus membranes, hearing grossly normal  Respiratory system: CTAB with diminished bases, no wheezes, rales or rhonchi, normal respiratory effort. Cardiovascular system: normal S1/S2, RRR, no peripheral edema.   Gastrointestinal system: soft, NT, ND Central nervous system: A&O x self hospital. no gross focal neurologic deficits, normal speech Extremities: moves all, no edema, normal tone Skin: dry, intact, normal temperature Psychiatry: normal mood, congruent affect   Data Reviewed:  Notable labs --- WBC 15.7 >> 13.7, Hbg 9.0 from 9.9.  Platelets 536k   Family Communication: None  Disposition: Status is: Inpatient Remains inpatient appropriate because: Ongoing evaluation including EGD required once TB definitively ruled out (awaiting AFB smears x 3 to result).   Planned Discharge Destination:  Group Home    Time spent: 36 minutes  Author: Ezekiel Slocumb, DO 07/24/2022 2:09 PM  For on call review [email or web address].

## 2022-07-24 NOTE — Evaluation (Signed)
Occupational Therapy Evaluation Patient Details Name: Kenneth [name]. MRN: [id number] DOB: 20-Jan-1961 Today's Date: 07/24/2022   History of Present Illness 61 y.o. male with medical history significant for Paranoid schizophrenia, hypertension, diagnosed with iron deficiency anemia in November 2023 for which he received Venofer x 1 by oncology with recommendation for further treatment and colonoscopy but subsequently lost to follow-up.  He presented to the emergency department (from the group home) with auditory hallucinations.  He is on Invega injections for schizophrenia.     In the emergency department reviewed sepsis, pneumonia, cavitary lesion suspicious for abscess versus necrotic mass.  He was also found to have esophageal wall thickening and pericarditis on CT chest.  He was started on IV fluids and empiric IV antibiotics.   Clinical Impression   Upon entering the room, pt supine in bed with no c/o pain and agreeable to OT intervention. Pt is very pleasant and cooperative throughout. Pt reports living in group home with ramp to enter. He endorses being Ind in self care and functional mobility without use of AD. Pt does endorse going outside frequently to smoke and sometimes goes to grocery store with staff. Pt demonstrates ability to perform self care and mobility without use of AD and no physical assistance. HR does increase to 130's with mobility but O2 saturation remains at 95% or better. Pt does not need skilled OT intervention at this time. OT to complete orders.      Recommendations for follow up therapy are one component of a multi-disciplinary discharge planning process, led by the attending physician.  Recommendations may be updated based on patient status, additional functional criteria and insurance authorization.   Follow Up Recommendations  No OT follow up     Assistance Recommended at Discharge PRN     Functional Status Assessment  Patient has not had a recent decline in  their functional status  Equipment Recommendations  None recommended by OT           Mobility Bed Mobility Overal bed mobility: Modified Independent                  Transfers Overall transfer level: Modified independent Equipment used: None                      Balance Overall balance assessment: Modified Independent                                         ADL either performed or assessed with clinical judgement   ADL Overall ADL's : Independent                                             Vision Patient Visual Report: No change from baseline              Pertinent Vitals/Pain Pain Assessment Pain Assessment: No/denies pain     Hand Dominance     Extremity/Trunk Assessment Upper Extremity Assessment Upper Extremity Assessment: Overall WFL for tasks assessed   Lower Extremity Assessment Lower Extremity Assessment: Overall WFL for tasks assessed       Communication     Cognition Arousal/Alertness: Awake/alert Behavior During Therapy: WFL for tasks assessed/performed Overall Cognitive Status: Within Functional Limits for tasks assessed  General Comments: Pleasant and cooperative throughout.                Home Living Family/patient expects to be discharged to:: Group home                                 Additional Comments: ramp to enter group home      Prior Functioning/Environment Prior Level of Function : Independent/Modified Independent               ADLs Comments: Ind without use of AD. Pt endorses going to grocery store with staff and going outside to smoke.                 OT Goals(Current goals can be found in the care plan section) Acute Rehab OT Goals Patient Stated Goal: to go to a different group home OT Goal Formulation: With patient Time For Goal Achievement: 07/24/22 Potential to Achieve Goals: Fair  OT  Frequency:         AM-PAC OT "6 Clicks" Daily Activity     Outcome Measure Help from another person eating meals?: None Help from another person taking care of personal grooming?: None Help from another person toileting, which includes using toliet, bedpan, or urinal?: None Help from another person bathing (including washing, rinsing, drying)?: None Help from another person to put on and taking off regular upper body clothing?: None Help from another person to put on and taking off regular lower body clothing?: None 6 Click Score: 24   End of Session Nurse Communication: Mobility status  Activity Tolerance: Patient tolerated treatment well Patient left: in bed;with call bell/phone within reach                   Time: 1028-1047 OT Time Calculation (min): 19 min Charges:  OT General Charges $OT Visit: 1 Visit OT Evaluation $OT Eval Low Complexity: 1 Low OT Treatments $Self Care/Home Management : 8-22 mins  Darleen Crocker, MS, OTR/L , CBIS ascom [phone]  07/24/22, 12:09 PM

## 2022-07-25 DIAGNOSIS — J984 Other disorders of lung: Secondary | ICD-10-CM | POA: Diagnosis not present

## 2022-07-25 DIAGNOSIS — F203 Undifferentiated schizophrenia: Secondary | ICD-10-CM | POA: Diagnosis not present

## 2022-07-25 DIAGNOSIS — R933 Abnormal findings on diagnostic imaging of other parts of digestive tract: Secondary | ICD-10-CM | POA: Diagnosis not present

## 2022-07-25 DIAGNOSIS — K229 Disease of esophagus, unspecified: Secondary | ICD-10-CM | POA: Diagnosis not present

## 2022-07-25 DIAGNOSIS — J850 Gangrene and necrosis of lung: Secondary | ICD-10-CM | POA: Diagnosis not present

## 2022-07-25 LAB — CBC WITH DIFFERENTIAL/PLATELET
Abs Immature Granulocytes: 0.09 10*3/uL — ABNORMAL HIGH (ref 0.00–0.07)
Basophils Absolute: 0.1 10*3/uL (ref 0.0–0.1)
Basophils Relative: 0 %
Eosinophils Absolute: 0.1 10*3/uL (ref 0.0–0.5)
Eosinophils Relative: 1 %
HCT: 29.5 % — ABNORMAL LOW (ref 39.0–52.0)
Hemoglobin: 9.2 g/dL — ABNORMAL LOW (ref 13.0–17.0)
Immature Granulocytes: 1 %
Lymphocytes Relative: 11 %
Lymphs Abs: 1.6 10*3/uL (ref 0.7–4.0)
MCH: 25.6 pg — ABNORMAL LOW (ref 26.0–34.0)
MCHC: 31.2 g/dL (ref 30.0–36.0)
MCV: 82.2 fL (ref 80.0–100.0)
Monocytes Absolute: 1.4 10*3/uL — ABNORMAL HIGH (ref 0.1–1.0)
Monocytes Relative: 10 %
Neutro Abs: 11.3 10*3/uL — ABNORMAL HIGH (ref 1.7–7.7)
Neutrophils Relative %: 77 %
Platelets: 541 10*3/uL — ABNORMAL HIGH (ref 150–400)
RBC: 3.59 MIL/uL — ABNORMAL LOW (ref 4.22–5.81)
RDW: 15.4 % (ref 11.5–15.5)
WBC: 14.6 10*3/uL — ABNORMAL HIGH (ref 4.0–10.5)
nRBC: 0 % (ref 0.0–0.2)

## 2022-07-25 LAB — BASIC METABOLIC PANEL
Anion gap: 6 (ref 5–15)
BUN: 19 mg/dL (ref 6–20)
CO2: 28 mmol/L (ref 22–32)
Calcium: 10 mg/dL (ref 8.9–10.3)
Chloride: 99 mmol/L (ref 98–111)
Creatinine, Ser: 0.8 mg/dL (ref 0.61–1.24)
GFR, Estimated: >60 mL/min (ref >60)
Glucose, Bld: 116 mg/dL — ABNORMAL HIGH (ref 70–99)
Potassium: 3.9 mmol/L (ref 3.5–5.1)
Sodium: 133 mmol/L — ABNORMAL LOW (ref 135–145)

## 2022-07-25 LAB — ACID FAST SMEAR (AFB, MYCOBACTERIA): Acid Fast Smear: NEGATIVE

## 2022-07-25 LAB — FERRITIN: Ferritin: 830 ng/mL — ABNORMAL HIGH (ref 24–336)

## 2022-07-25 NOTE — Progress Notes (Signed)
Progress Note   Patient: Kenneth [name]. WVP:[id number] DOB: [birth year] DOA: 07/15/2022     10 DOS: the patient was seen and examined on 07/25/2022   Brief hospital course: Avry Roedl. is a 62 y.o. male with medical history significant for Paranoid schizophrenia, hypertension, diagnosed with iron deficiency anemia in November 2023 for which he received Venofer x 1 by oncology with recommendation for further treatment and colonoscopy but subsequently lost to follow-up.  He presented to the emergency department (from the group home) with auditory hallucinations.  He is on Invega injections for schizophrenia.   In the emergency department reviewed sepsis, pneumonia, cavitary lesion suspicious for abscess versus necrotic mass.  He was also found to have esophageal wall thickening and pericarditis on CT chest.  He was started on IV fluids and empiric IV antibiotics.  Further details of hospital course and management as below.  Assessment and Plan: * Schizophrenia, undifferentiated ([place]) .  Sepsis ([place]) Septic embolism Patient with multiple foci of infection versus metastasis including lungs, kidneys, pericardium Sepsis criteria include tachycardia, hypotension, leukocytosis of 27,000 Primary source appears is pulmonary (pneumonia) Received Sepsis protocol fluids and IV broad spectrum antibiotics. --Follow blood cultures --ID following  Septic embolism ([name]) Versus metastatic disease.   Mgmt as outlined  Collapse of left lung See "Cavitating mass..."  Cavitating mass of lung/ lung abscess Collapse of left lung No significant respiratory complaints aside from mild cough. Etiology TBD - infectious vs malignancy given all the imaging findings. Treated initially with broad spectrum IV antibiotics >> Unasyn --Now on Augmentin - continue --ID is following --Pulmonology consulted, signed off. They plan for outpatient evaluation, PET scan recommended. --Aspiration  precautions --Speech therapy evaluation --Supplemental oxygen if needed --Antitussives, albuterol as needed and supportive care --Follow pending AFB smear x 3  Acute pyelonephritis CT showed findings concerning for pyelonephritis. Early renal abscesses are not excluded. Continue aforementioned antibiotics Follow-up urinalysis ID consult  Pericarditis CT shows thickening and hyperenhancement of the visceral and parietal pericardium with central low attenuation fluid compatible with pericarditis.Marland KitchenMarland KitchenDifferential consideration includes septic emboli or metastases. Echocardiogram 07/16/22 showed LVEF 54-62%, grade 1 diastolic dysfunction. Pt seems asymptomatic, no chest pain complaints or dyspnea. --Consider cardiology consult  --Will pursue biopsy by EGD this admission  Esophageal abnormality CT showed question circumferential lower esophageal wall thickening. Needs biopsy by EGD. --Will consult GI once TB definitively ruled out (awaiting AFB smear x 3)   Anemia Suspect related to underlying malignancy He was given IV Venofer 200 mg in November for incidental finding of anemia on routine blood work. Hemoglobin 10.1 on admission, down from 12.3 in Nov 2023. --Continue to trend hemoglobin  --Will defer iron infusions given infection --Hematology consult vs follow up  Hyponatremia Sodium trending down 136 >> 134 >> 132. Suspect hypovolemia, unclear how much he is eating and drinking.  Also potential malignancy is a concern. --check serum Osm, urine Osm & urine sodium for now --fluid challenge with NS @ 75 cc/hr x 1 day --repeat BMP in AM  Underweight Patient had a drop in BMI from 22.65 a couple months prior to 18.99 based on chart review High suspicion for malignancy  Auditory hallucinations Hx of Schizophrenia, undifferentiated Patient appears stable at this time Seen by psyhciatry.  Per their NP: "He can continue the oral Invega and follow-up with his outpatient provider. Per  the group home, his initial dose of [name] is due Feb. 08".    Necrotizing pneumonia ([place]) .  Hypercalcemia Initial  Ca on admission was 8.9 >> 10.4 (on 2/8).  Suspect dehydration given sodium also trending down.   Malignancy is a concern, however this is acute elevation of Ca.   --Continue gentle IV fluids  --Monitor BMP --check PTH, PTHrP - pending  Constipation Bowel regimen per orders. Resolved. Had BM on 2/4  Thrombocytosis Likely reactive. Monitor CBC.  Protein-calorie malnutrition, severe Related to chronic illness (schizophrenia) as evidenced by moderate fat depletion, severe fat depletion, moderate muscle depletion, severe muscle depletion, percent weight loss.  --Appreciate dietitian recommendations --Started on liberalized diet, Ensure drinks, Magic cups TID, multivitamins  Abnormal CT scan, esophagus Per CT report: "Question circumferential lower esophageal wall thickening. Underlying neoplasm is difficult to exclude." -- Updated GI re negative AFB smears x 3 --Plan for EGD biopsies hopefully tomorrow  Chronic obstructive lung disease ([place]) Stable.  Continue home inhaler DuoNebs as needed  Pneumonia of left lung due to infectious organism Continue Augmentin Follow up pending AFB smear x 3. Quaniferon negative. ID following - see their recs Supportive care per orders  Hypertension Hypotension Patient was previously on HCTZ - held. BP's are controlled, soft at times. --Continue holding HCTZ   Bipolar 1 disorder ([place]) Continue current regimen per psych.  Paranoid schizophrenia ([place]) See auditory hallucinations  Tobacco use disorder Nicotine patch if withdrawal suspected.    PT/OT evaluation ordered -- pt from group home, will need to be independent to return there, otherwise will need SNF placement.     Subjective: Pt seen awake resting in bed.  Reports feeling well overall, offers no acute complaints.  He denies having fever or chills.   Reports only occasional cough.  No acute events reported.   Physical Exam: Vitals:   07/24/22 2202 07/24/22 2215 07/25/22 0554 07/25/22 0802  BP: 98/67 (!) 85/70 103/69 (!) 103/56  Pulse: (!) 101 96 99 (!) 102  Resp: 18 18 18 16   Temp: (!) 97.5 F (36.4 C) 98.2 F (36.8 C) 98.3 F (36.8 C) 98 F (36.7 C)  TempSrc:  Axillary    SpO2: 96% 97% 95% 95%  Weight:      Height:        General exam: awake, alert, no acute distress, underweight HEENT: moist mucus membranes, hearing grossly normal  Respiratory system: clear b/l with diminished bases more so on left, normal respiratory effort, on room air Cardiovascular system: normal S1/S2, RRR, no peripheral edema.   Gastrointestinal system: soft, NT, ND Central nervous system: A&O x self hospital. no gross focal neurologic deficits, normal speech Extremities: moves all, no edema, normal tone Skin: dry, intact, normal temperature Psychiatry: normal mood, congruent affect   Data Reviewed:  Notable labs --- WBC 15.7 >> 13.7>>14.6, Hbg 9.2 from 9.0.  Platelets 541k  BMP with Na 133, glucose 116 otherwise within normal   Family Communication: Updated patient's cousin Palma Holter) this afternoon by phone.  Verdis Frederickson confirms patient makes his own decisions independently. She reports several conversations with patient regarding if he ever got cancer, he would not want to undergo treatment.  She understands his refusing EGD and agrees.  She reports they have watched multiple family members go through cancer treatments, and he would never want to so himself.   Disposition: Status is: Inpatient Remains inpatient appropriate because: Ongoing evaluation including EGD required once TB definitively ruled out (awaiting AFB smears x 3 to result).   Planned Discharge Destination:  Group Home    Time spent: 36 minutes  Author: Ezekiel Slocumb, DO 07/25/2022 1:06  PM  For on call review [email or web address].

## 2022-07-25 NOTE — Progress Notes (Signed)
Kenneth Darby, MD [address]  [place]  [place], [place] [address]  Main: [phone]  Fax: [phone] Pager: [phone]   Subjective: I was reached out by Dr. Arbutus Ped to discuss with patient regarding endoscopic evaluation because TB has been ruled out.  Patient is tolerating clear liquids well   Objective: Vital signs in last 24 hours: Vitals:   07/24/22 2202 07/24/22 2215 07/25/22 0554 07/25/22 0802  BP: 98/67 (!) 85/70 103/69 (!) 103/56  Pulse: (!) 101 96 99 (!) 102  Resp: 18 18 18 16   Temp: (!) 97.5 F (36.4 C) 98.2 F (36.8 C) 98.3 F (36.8 C) 98 F (36.7 C)  TempSrc:  Axillary    SpO2: 96% 97% 95% 95%  Weight:      Height:       Weight change:   Intake/Output Summary (Last 24 hours) at 07/25/2022 1450 Last data filed at 07/25/2022 0800 Gross per 24 hour  Intake 240 ml  Output 300 ml  Net -60 ml     Exam: Heart:: Regular rate and rhythm, S1S2 present, or without murmur or extra heart sounds Lungs: normal and clear to auscultation Abdomen: soft, nontender, normal bowel sounds   Lab Results:    Latest Ref Rng & Units 07/25/2022    6:31 AM 07/24/2022    3:46 AM 07/23/2022    4:16 AM  CBC  WBC 4.0 - 10.5 K/uL 14.6  13.7  15.7   Hemoglobin 13.0 - 17.0 g/dL 9.2  9.0  9.9   Hematocrit 39.0 - 52.0 % 29.5  28.7  31.4   Platelets 150 - 400 K/uL 541  536  587       Latest Ref Rng & Units 07/25/2022    6:31 AM 07/24/2022    3:46 AM 07/23/2022    4:16 AM  CMP  Glucose 70 - 99 mg/dL 116  142  96   BUN 6 - 20 mg/dL 19  21  21    Creatinine 0.61 - 1.24 mg/dL 0.80  0.87  0.79   Sodium 135 - 145 mmol/L 133  132  132   Potassium 3.5 - 5.1 mmol/L 3.9  3.8  3.9   Chloride 98 - 111 mmol/L 99  97  96   CO2 22 - 32 mmol/L 28  28  30    Calcium 8.9 - 10.3 mg/dL 10.0  9.9  10.4     Micro Results: Recent Results (from the past 240 hour(s))  Resp panel by RT-PCR (RSV, Flu A&B, Covid) Anterior Nasal Swab     Status: None   Collection Time: 07/15/22  6:05  PM   Specimen: Anterior Nasal Swab  Result Value Ref Range Status   SARS Coronavirus 2 by RT PCR NEGATIVE NEGATIVE Final    Comment: (NOTE) SARS-CoV-2 target nucleic acids are NOT DETECTED.  The SARS-CoV-2 RNA is generally detectable in upper respiratory specimens during the acute phase of infection. The lowest concentration of SARS-CoV-2 viral copies this assay can detect is 138 copies/mL. A negative result does not preclude SARS-Cov-2 infection and should not be used as the sole basis for treatment or other patient management decisions. A negative result may occur with  improper specimen collection/handling, submission of specimen other than nasopharyngeal swab, presence of viral mutation(s) within the areas targeted by this assay, and inadequate number of viral copies(<138 copies/mL). A negative result must be combined with clinical observations, patient history, and epidemiological information. The expected result is Negative.  Fact Sheet for  Patients:  EntrepreneurPulse.com.au  Fact Sheet for Healthcare Providers:  IncredibleEmployment.be  This test is no t yet approved or cleared by the [place] FDA and  has been authorized for detection and/or diagnosis of SARS-CoV-2 by FDA under an Emergency Use Authorization (EUA). This EUA will remain  in effect (meaning this test can be used) for the duration of the COVID-19 declaration under Section 564(b)(1) of the Act, 21 U.S.C.section 360bbb-3(b)(1), unless the authorization is terminated  or revoked sooner.       Influenza A by PCR NEGATIVE NEGATIVE Final   Influenza B by PCR NEGATIVE NEGATIVE Final    Comment: (NOTE) The Xpert Xpress SARS-CoV-2/FLU/RSV plus assay is intended as an aid in the diagnosis of influenza from Nasopharyngeal swab specimens and should not be used as a sole basis for treatment. Nasal washings and aspirates are unacceptable for Xpert Xpress  SARS-CoV-2/FLU/RSV testing.  Fact Sheet for Patients: EntrepreneurPulse.com.au  Fact Sheet for Healthcare Providers: IncredibleEmployment.be  This test is not yet approved or cleared by the [place] FDA and has been authorized for detection and/or diagnosis of SARS-CoV-2 by FDA under an Emergency Use Authorization (EUA). This EUA will remain in effect (meaning this test can be used) for the duration of the COVID-19 declaration under Section 564(b)(1) of the Act, 21 U.S.C. section 360bbb-3(b)(1), unless the authorization is terminated or revoked.     Resp Syncytial Virus by PCR NEGATIVE NEGATIVE Final    Comment: (NOTE) Fact Sheet for Patients: EntrepreneurPulse.com.au  Fact Sheet for Healthcare Providers: IncredibleEmployment.be  This test is not yet approved or cleared by the [place] FDA and has been authorized for detection and/or diagnosis of SARS-CoV-2 by FDA under an Emergency Use Authorization (EUA). This EUA will remain in effect (meaning this test can be used) for the duration of the COVID-19 declaration under Section 564(b)(1) of the Act, 21 U.S.C. section 360bbb-3(b)(1), unless the authorization is terminated or revoked.  Performed at [facility], [address] [place], [place] [address]   Blood Culture (routine x 2)     Status: None   Collection Time: 07/15/22  7:15 PM   Specimen: BLOOD  Result Value Ref Range Status   Specimen Description BLOOD LEFT [facility]  Final   Special Requests   Final    BOTTLES DRAWN AEROBIC AND ANAEROBIC Blood Culture adequate volume   Culture   Final    NO GROWTH 5 DAYS Performed at [facility], [address] [place], [place] [address]    Report Status 07/20/2022 FINAL  Final  Blood Culture (routine x 2)     Status: None   Collection Time: 07/15/22  7:15 PM   Specimen: BLOOD  Result Value Ref Range Status   Specimen  Description BLOOD RIGHT [facility]  Final   Special Requests   Final    BOTTLES DRAWN AEROBIC AND ANAEROBIC Blood Culture adequate volume   Culture   Final    NO GROWTH 5 DAYS Performed at [facility], [address], [place], [place] [address]    Report Status 07/20/2022 FINAL  Final  MRSA Next Gen by PCR, Nasal     Status: None   Collection Time: 07/16/22  9:50 AM   Specimen: Nasal Mucosa; Nasal Swab  Result Value Ref Range Status   MRSA by PCR Next Gen NOT DETECTED NOT DETECTED Final    Comment: (NOTE) The GeneXpert MRSA Assay (FDA approved for NASAL specimens only), is one component of a comprehensive MRSA colonization surveillance  program. It is not intended to diagnose MRSA infection nor to guide or monitor treatment for MRSA infections. Test performance is not FDA approved in patients less than 4 years old. Performed at [facility], [address] [place], [place] [address]   Acid Fast Smear (AFB)     Status: None   Collection Time: 07/16/22 10:09 AM   Specimen: Sputum  Result Value Ref Range Status   AFB Specimen Processing Concentration  Final   Acid Fast Smear Negative  Final    Comment: (NOTE) Performed At: [facility] [address] [place], [place] [id number] Rush Farmer MD [id number]    Source (AFB) SPUTUM  Final    Comment: Performed at [facility], [address] [place], [place] [address]  Acid Fast Smear (AFB)     Status: None   Collection Time: 07/16/22 11:11 AM   Specimen: Sputum  Result Value Ref Range Status   AFB Specimen Processing Concentration  Final   Acid Fast Smear Negative  Final    Comment: (NOTE) Performed At: [facility] [address] [place], [place] [id number] Rush Farmer MD [id number]    Source (AFB) SPUTUM  Final    Comment: Performed at [facility], [address] [place], [place] [address]  Acid Fast Smear (AFB)     Status: None   Collection Time: 07/17/22  12:15 PM   Specimen: Sputum  Result Value Ref Range Status   AFB Specimen Processing Concentration  Final   Acid Fast Smear Negative  Final    Comment: (NOTE) Performed At: [facility] [address] [place], [place] [id number] Rush Farmer MD [id number]    Source (AFB) SPUTUM  Final    Comment: Performed at [facility], [address] [place], [place] [address]   Studies/Results: No results found. Medications: I have reviewed the patient's current medications. Prior to Admission:  Medications Prior to Admission  Medication Sig Dispense Refill Last Dose   atorvastatin (LIPITOR) 20 MG tablet Take 20 mg by mouth daily.   Past Week   BREZTRI AEROSPHERE 160-9-4.8 MCG/ACT AERO Inhale 1 puff into the lungs daily.   Past Week   hydrochlorothiazide (HYDRODIURIL) 12.5 MG tablet Take 12.5 mg by mouth daily.   Past Week   hydrOXYzine (ATARAX) 25 MG tablet Take 25 mg by mouth 3 (three) times daily.   Past Week   lisinopril (ZESTRIL) 5 MG tablet Take 5 mg by mouth daily.   Past Week   montelukast (SINGULAIR) 10 MG tablet Take 10 mg by mouth daily.   Past Week   oxybutynin (DITROPAN-XL) 10 MG 24 hr tablet Take 10 mg by mouth daily.   Past Week   PARoxetine (PAXIL) 20 MG tablet Take 20 mg by mouth daily.   Past Week   albuterol (VENTOLIN HFA) 108 (90 Base) MCG/ACT inhaler Inhale 2 puffs into the lungs every 6 (six) hours as needed for wheezing or shortness of breath. 8 g 2 prn at prn   INVEGA TRINZA 819 MG/2.63ML injection Inject into the muscle.      paliperidone (INVEGA SUSTENNA) 234 MG/1.5ML SUSY injection Inject 234 mg into the muscle every 28 (twenty-eight) days. (Patient not taking: Reported on 07/15/2022) 1.8 mL 1 Not Taking   Scheduled:  amoxicillin-clavulanate  1 tablet Oral Q12H   atorvastatin  20 mg Oral Daily   enoxaparin (LOVENOX) injection  40 mg Subcutaneous Q24H   feeding supplement  237 mL Oral TID BM   fluticasone furoate-vilanterol  1 puff Inhalation  Daily   And   umeclidinium bromide  1 puff Inhalation Daily   hydrOXYzine  25 mg Oral TID   montelukast  10 mg Oral Daily   multivitamin with minerals  1 tablet Oral Daily   oxybutynin  10 mg Oral Daily   paliperidone  3 mg Oral Daily   PARoxetine  20 mg Oral Daily   polyethylene glycol  17 g Oral Daily   sorbitol, milk of mag, mineral oil, glycerin (SMOG) enema  960 mL Rectal Once   Continuous: EUM:[id number] **OR** acetaminophen, albuterol, HYDROcodone-acetaminophen, ondansetron **OR** ondansetron (ZOFRAN) IV, senna-docusate Anti-infectives (From admission, onward)    Start     Dose/Rate Route Frequency Ordered Stop   07/20/22 1200  amoxicillin-clavulanate (AUGMENTIN) 875-125 MG per tablet 1 tablet        1 tablet Oral Every 12 hours 07/20/22 1112     07/16/22 2000  azithromycin (ZITHROMAX) 500 mg in sodium chloride 0.9 % 250 mL IVPB  Status:  Discontinued        500 mg 250 mL/hr over 60 Minutes Intravenous Every 24 hours 07/15/22 2119 07/16/22 1039   07/16/22 1200  vancomycin (VANCOREADY) IVPB 750 mg/150 mL  Status:  Discontinued        750 mg 150 mL/hr over 60 Minutes Intravenous Every 12 hours 07/15/22 2144 07/16/22 1613   07/16/22 1200  Ampicillin-Sulbactam (UNASYN) 3 g in sodium chloride 0.9 % 100 mL IVPB  Status:  Discontinued        3 g 200 mL/hr over 30 Minutes Intravenous Every 6 hours 07/16/22 1039 07/20/22 1112   07/16/22 0400  ceFEPIme (MAXIPIME) 2 g in sodium chloride 0.9 % 100 mL IVPB  Status:  Discontinued        2 g 200 mL/hr over 30 Minutes Intravenous Every 8 hours 07/15/22 2144 07/16/22 1039   07/16/22 0000  azithromycin (ZITHROMAX) 500 mg in sodium chloride 0.9 % 250 mL IVPB  Status:  Discontinued        500 mg 250 mL/hr over 60 Minutes Intravenous Every 24 hours 07/15/22 2105 07/15/22 2119   07/15/22 2145  vancomycin (VANCOREADY) IVPB 500 mg/100 mL        500 mg 100 mL/hr over 60 Minutes Intravenous  Once 07/15/22 2144 07/16/22 0830   07/15/22 1915   vancomycin (VANCOCIN) IVPB 1000 mg/200 mL premix        1,000 mg 200 mL/hr over 60 Minutes Intravenous  Once 07/15/22 1900 07/15/22 2143   07/15/22 1915  ceFEPIme (MAXIPIME) 2 g in sodium chloride 0.9 % 100 mL IVPB        2 g 200 mL/hr over 30 Minutes Intravenous  Once 07/15/22 1900 07/15/22 2005   07/15/22 1915  azithromycin (ZITHROMAX) 500 mg in sodium chloride 0.9 % 250 mL IVPB        500 mg 250 mL/hr over 60 Minutes Intravenous  Once 07/15/22 1900 07/15/22 2143      Scheduled Meds:  amoxicillin-clavulanate  1 tablet Oral Q12H   atorvastatin  20 mg Oral Daily   enoxaparin (LOVENOX) injection  40 mg Subcutaneous Q24H   feeding supplement  237 mL Oral TID BM   fluticasone furoate-vilanterol  1 puff Inhalation Daily   And   umeclidinium bromide  1 puff Inhalation Daily   hydrOXYzine  25 mg Oral TID   montelukast  10 mg Oral Daily   multivitamin with minerals  1 tablet Oral Daily   oxybutynin  10 mg Oral  Daily   paliperidone  3 mg Oral Daily   PARoxetine  20 mg Oral Daily   polyethylene glycol  17 g Oral Daily   sorbitol, milk of mag, mineral oil, glycerin (SMOG) enema  960 mL Rectal Once   Continuous Infusions: PRN Meds:.acetaminophen **OR** acetaminophen, albuterol, HYDROcodone-acetaminophen, ondansetron **OR** ondansetron (ZOFRAN) IV, senna-docusate   Assessment: Principal Problem:   Schizophrenia, undifferentiated ([name]) Active Problems:   Tobacco use disorder   Paranoid schizophrenia ([name])   Bipolar 1 disorder ([place])   Hypertension   Pneumonia of left lung due to infectious organism   Sepsis ([place])   Chronic obstructive lung disease ([place])   Cavitating mass of lung/ lung abscess   Auditory hallucinations   Hyponatremia   Anemia   Esophageal abnormality   Septic embolism ([name])   Underweight   Collapse of left lung   Pericarditis   Abnormal CT scan, esophagus   Protein-calorie malnutrition, severe   Thrombocytosis   Constipation   Hypercalcemia   Necrotizing  pneumonia [facility])  Mr. Judea Riches is a 62 year old male with history of paranoid schizophrenia, admitted with sepsis, lung abscess and lung mass on antibiotics, leukocytosis, acute pyelonephritis and pericarditis, was incidentally found to have thickening of the lower esophagus.  He also has iron deficiency anemia  Plan: ?  Circumferential lower esophageal wall thickening: Patient denies any difficulty swallowing I have discussed with him regarding upper endoscopy for further evaluation and he refused it because if he finds out that he has cancer, he does not want to undergo chemotherapy.  I tried to discuss risks and benefits of this procedure and he adamantly refused upper endoscopy.  I have informed the same to Dr. Arbutus Ped Advance diet as tolerated Continue Protonix 40 mg p.o. 1-2 times daily  History of iron deficiency anemia I have also discussed with him regarding colonoscopy and he is refusing to undergo any GI procedures  GI will sign off at this time, please call us back with questions or concerns   LOS: 10 days   Torryn Hudspeth 07/25/2022, 2:50 PM

## 2022-07-26 DIAGNOSIS — Z593 Problems related to living in residential institution: Secondary | ICD-10-CM

## 2022-07-26 DIAGNOSIS — F203 Undifferentiated schizophrenia: Secondary | ICD-10-CM | POA: Diagnosis not present

## 2022-07-26 LAB — CBC
HCT: 30 % — ABNORMAL LOW (ref 39.0–52.0)
Hemoglobin: 9.5 g/dL — ABNORMAL LOW (ref 13.0–17.0)
MCH: 26.3 pg (ref 26.0–34.0)
MCHC: 31.7 g/dL (ref 30.0–36.0)
MCV: 83.1 fL (ref 80.0–100.0)
Platelets: 574 10*3/uL — ABNORMAL HIGH (ref 150–400)
RBC: 3.61 MIL/uL — ABNORMAL LOW (ref 4.22–5.81)
RDW: 15.4 % (ref 11.5–15.5)
WBC: 13.8 10*3/uL — ABNORMAL HIGH (ref 4.0–10.5)
nRBC: 0 % (ref 0.0–0.2)

## 2022-07-26 LAB — BASIC METABOLIC PANEL
Anion gap: 6 (ref 5–15)
BUN: 20 mg/dL (ref 6–20)
CO2: 29 mmol/L (ref 22–32)
Calcium: 10.2 mg/dL (ref 8.9–10.3)
Chloride: 99 mmol/L (ref 98–111)
Creatinine, Ser: 0.89 mg/dL (ref 0.61–1.24)
GFR, Estimated: >60 mL/min (ref >60)
Glucose, Bld: 115 mg/dL — ABNORMAL HIGH (ref 70–99)
Potassium: 4.1 mmol/L (ref 3.5–5.1)
Sodium: 134 mmol/L — ABNORMAL LOW (ref 135–145)

## 2022-07-26 LAB — PTH, INTACT AND CALCIUM
Calcium, Total (PTH): 10.4 mg/dL — ABNORMAL HIGH (ref 8.6–10.2)
PTH: 5 pg/mL — ABNORMAL LOW (ref 15–65)

## 2022-07-26 NOTE — Assessment & Plan Note (Signed)
Pt's cousin reports group home is closing, being shut down by the state.  She feels he has not been well cared for there and has already moved out his belongings since his admission. --TOC consulted

## 2022-07-26 NOTE — TOC Progression Note (Signed)
Transition of Care (TOC) - Progression Note    Patient Details  Name: Kenneth [name]. MRN: [id number] Date of Birth: 08-Aug-1960  Transition of [facility]) CM/SW Contact  Izola Price, RN Phone Number: 07/26/2022, 12:55 PM  Clinical Narrative:  2/11: List of [place] for [place] provided to cousin for review as current group home is closing and family has some issues with it as well.  Provider to speak to cousin today. TOC to follow up. Simmie Davies RN CM          Expected Discharge Plan and Services                                               Social Determinants of Health (SDOH) Interventions SDOH Screenings   Food Insecurity: No Food Insecurity (07/16/2022)  Housing: Low Risk  (07/16/2022)  Transportation Needs: No Transportation Needs (07/16/2022)  Utilities: Not At Risk (07/16/2022)  Alcohol Screen: Low Risk  (03/16/2018)  Tobacco Use: High Risk (07/17/2022)    Readmission Risk Interventions     No data to display

## 2022-07-26 NOTE — Progress Notes (Signed)
Progress Note   Patient: Kenneth [name]. LEX:[id number] DOB: [birth year] DOA: 07/15/2022     11 DOS: the patient was seen and examined on 07/26/2022   Brief hospital course: Kenneth [name]. is a 62 y.o. male with medical history significant for Paranoid schizophrenia, hypertension, diagnosed with iron deficiency anemia in November 2023 for which he received Venofer x 1 by oncology with recommendation for further treatment and colonoscopy but subsequently lost to follow-up.  He presented to the emergency department (from the group home) with auditory hallucinations.  He is on Invega injections for schizophrenia.   In the emergency department reviewed sepsis, pneumonia, cavitary lesion suspicious for abscess versus necrotic mass.  He was also found to have esophageal wall thickening and pericarditis on CT chest.  He was started on IV fluids and empiric IV antibiotics.  Further details of hospital course and management as below.  Assessment and Plan: * Schizophrenia, undifferentiated (Kenneth [name]) .  Sepsis (Kenneth [name]) Septic embolism Patient with multiple foci of infection versus metastasis including lungs, kidneys, pericardium Sepsis criteria include tachycardia, hypotension, leukocytosis of 27,000 Primary source appears is pulmonary (pneumonia) Received Sepsis protocol fluids and IV broad spectrum antibiotics. --Follow blood cultures --ID following  Septic embolism (Kenneth [name]) Versus metastatic disease.   Mgmt as outlined  Collapse of left lung See "Cavitating mass..."  Cavitating mass of lung/ lung abscess Collapse of left lung No significant respiratory complaints aside from mild cough. Etiology TBD - infectious vs malignancy given all the imaging findings. Treated initially with broad spectrum IV antibiotics >> Unasyn --Now on Augmentin - continue --ID is following --Pulmonology consulted, signed off. They plan for outpatient evaluation, PET scan recommended. Pt declines further  evaluation to determine if malignancy --Aspiration precautions --Speech therapy evaluation --Supplemental oxygen if needed --Antitussives, albuterol as needed and supportive care   Acute pyelonephritis CT showed findings concerning for pyelonephritis. Early renal abscesses are not excluded. Continue aforementioned antibiotics Follow-up urinalysis ID consult  Pericarditis CT shows thickening and hyperenhancement of the visceral and parietal pericardium with central low attenuation fluid compatible with pericarditis.Marland KitchenMarland KitchenDifferential consideration includes septic emboli or metastases. Echocardiogram 07/16/22 showed LVEF 44-96%, grade 1 diastolic dysfunction. Pt seems asymptomatic, no chest pain complaints or dyspnea. --Consider cardiology consult  --Will pursue biopsy by EGD this admission  Esophageal abnormality As outlined   Anemia Suspect related to underlying malignancy He was given IV Venofer 200 mg in November for incidental finding of anemia on routine blood work. Hemoglobin 10.1 on admission, down from 12.3 in Nov 2023. --Continue to trend hemoglobin  --Will defer iron infusions given possible infection --Hematology consult vs follow up  Hyponatremia Sodium trending down 136 >> 134 >> 132. Suspect hypovolemia, unclear how much he is eating and drinking.  Also potential malignancy is a concern. Given gentle IV fluids.   Na stable 132-134  --encourage PO intake --repeat BMP in AM  Underweight Patient had a drop in BMI from 22.65 a couple months Kenneth to 18.99 based on chart review High suspicion for malignancy  Auditory hallucinations Hx of Schizophrenia, undifferentiated Patient appears stable at this time Seen by psyhciatry.  Per their NP: "He can continue the oral Invega and follow-up with his outpatient provider. Per the group home, his initial dose of [name] is due Feb. 08".    Lives in group home Pt's cousin reports group home is closing, being shut down  by the state.  She feels he has not been well cared for there and has already moved out  his belongings since his admission. --TOC consulted  Necrotizing pneumonia (Kenneth [name]) .  Hypercalcemia Initial Ca on admission was 8.9 >> 10.4 (on 2/8).  Suspect dehydration given sodium also trending down.   Malignancy is a concern, however this is acute elevation of Ca.   --Monitor off IV fluids  --Monitor BMP --check PTH, PTHrP - pending  Constipation Bowel regimen per orders. Resolved. Had BM on 2/4  Thrombocytosis Likely reactive. Monitor CBC.  Protein-calorie malnutrition, severe Related to chronic illness (schizophrenia) as evidenced by moderate fat depletion, severe fat depletion, moderate muscle depletion, severe muscle depletion, percent weight loss.  --Appreciate dietitian recommendations --Started on liberalized diet, Ensure drinks, Magic cups TID, multivitamins  Abnormal CT scan, esophagus Per CT report: "Question circumferential lower esophageal wall thickening. Underlying neoplasm is difficult to exclude." -- Updated GI re negative AFB smears x 3 -- Patient declines EGD / biopsies  Chronic obstructive lung disease (Kenneth [name]) Stable.  Continue home inhaler DuoNebs as needed  Pneumonia of left lung due to infectious organism Continue Augmentin Follow up pending AFB smear x 3. Quaniferon negative. ID following - see their recs Supportive care per orders  Hypertension Hypotension Patient was previously on HCTZ - held. BP's are controlled, soft at times. --Continue holding HCTZ   Bipolar 1 disorder (Kenneth [name]) Continue current regimen per psych.  Paranoid schizophrenia (Kenneth [name]) See auditory hallucinations  Tobacco use disorder Nicotine patch if withdrawal suspected.    PT/OT evaluation ordered -- no rehab needs.      Subjective: Pt seen awake resting in bed.  He declined EGD yesterday when seen by GI. We discussed reason for the test and getting biopsies.  Pt declines any  further procedures or evaluation to determine if he has any cancer.  He states he would not want any treatment, and would rather not know, he would prefer to "live out my days in peace".  He was clearly annoyed by repeated discussions on this subject and states he doesn't want to talk about it any more.  He reports feeling fine, denies acute complaints.   Physical Exam: Vitals:   07/25/22 0802 07/25/22 2131 07/26/22 0501 07/26/22 0757  BP: (!) 103/56 106/70 105/65 109/65  Pulse: (!) 102 (!) 101 97 (!) 103  Resp: 16 20 18 20   Temp: 98 F (36.7 C) 99 F (37.2 C) 99 F (37.2 C) 98.9 F (37.2 C)  TempSrc:      SpO2: 95% 97% 98% 97%  Weight:      Height:        General exam: awake, alert, no acute distress, underweight HEENT: moist mucus membranes, hearing grossly normal  Respiratory system: diminished breath sounds, normal respiratory effort, on room air Cardiovascular system: normal S1/S2, RRR, no peripheral edema.   Gastrointestinal system: soft, NT, ND Central nervous system: A&O x self hospital. no gross focal neurologic deficits, normal speech Extremities: moves all, no edema, normal tone Skin: dry, intact, normal temperature Psychiatry: normal mood, congruent affect   Data Reviewed:  Notable labs --- WBC 15.7 >> 13.7>>14.6 >> 13.8, Hbg 9.5 from 9.2.  Platelets 574k  BMP with Na 134, glucose 115 otherwise within normal   Family Communication: Updated patient's cousin Palma Holter) by phone 2/10, 2/11.  Verdis Frederickson confirms patient makes his own decisions independently. She reports several conversations with patient regarding if he ever got cancer, he would not want to undergo treatment.  She understands his refusing EGD and agrees.  She reports they have watched multiple family members go through cancer treatments,  and he would never want to so himself.   Disposition: Status is: Inpatient Remains inpatient appropriate because: Ongoing evaluation and monitoring.  Anticipate  discharge home with cousin in 1-2 days.   Planned Discharge Destination:  Home with cousin; not returning to his group home    Time spent: 38 minutes  Author: Ezekiel Slocumb, DO 07/26/2022 2:12 PM  For on call review [email or web address].

## 2022-07-27 ENCOUNTER — Encounter: Payer: Self-pay | Admitting: Internal Medicine

## 2022-07-27 DIAGNOSIS — J984 Other disorders of lung: Secondary | ICD-10-CM | POA: Diagnosis not present

## 2022-07-27 MED ORDER — ACETAMINOPHEN 325 MG PO TABS: 650.0000 mg | ORAL_TABLET | Freq: Four times a day (QID) | ORAL | Status: DC | PRN

## 2022-07-27 MED ORDER — ONDANSETRON HCL 4 MG PO TABS: 4.0000 mg | ORAL_TABLET | Freq: Four times a day (QID) | ORAL | 0 refills | Status: DC | PRN

## 2022-07-27 MED ORDER — SENNOSIDES-DOCUSATE SODIUM 8.6-50 MG PO TABS: 1.0000 | ORAL_TABLET | Freq: Every evening | ORAL | 1 refills | Status: DC | PRN

## 2022-07-27 MED ORDER — ENSURE ENLIVE PO LIQD: 237.0000 mL | Freq: Three times a day (TID) | ORAL | 12 refills | Status: DC

## 2022-07-27 MED ORDER — HYDROCODONE-ACETAMINOPHEN 5-325 MG PO TABS: 1.0000 | ORAL_TABLET | ORAL | 0 refills | Status: DC | PRN

## 2022-07-27 MED ORDER — PALIPERIDONE ER 3 MG PO TB24: 3.0000 mg | ORAL_TABLET | Freq: Every day | ORAL | 0 refills | Status: DC

## 2022-07-27 MED ORDER — ADULT MULTIVITAMIN W/MINERALS CH: 1.0000 | ORAL_TABLET | Freq: Every day | ORAL | Status: DC

## 2022-07-27 MED ORDER — POLYETHYLENE GLYCOL 3350 17 G PO PACK: 17.0000 g | PACK | Freq: Every day | ORAL | 0 refills | Status: DC | PRN

## 2022-07-27 MED ORDER — AMOXICILLIN-POT CLAVULANATE 875-125 MG PO TABS: 1.0000 | ORAL_TABLET | Freq: Two times a day (BID) | ORAL | 0 refills | Status: DC

## 2022-07-27 MED ORDER — AMOXICILLIN-POT CLAVULANATE 875-125 MG PO TABS: 1.0000 | ORAL_TABLET | Freq: Two times a day (BID) | ORAL | 0 refills | Status: AC

## 2022-07-27 NOTE — Discharge Summary (Signed)
Physician Discharge Summary   Patient: Kenneth [name]. MRN: [id number] DOB: [birth year]  Admit date:     07/15/2022  Discharge date: 07/27/22  Discharge Physician: Ezekiel Slocumb   PCP: Pcp, No   Recommendations at discharge:   Follow up with outpatient psychiatry team for Kenneth [name] injection Follow up with Pulmonology and/or Gastroenterology for procedures if you change your mind and want to pursue procedures and biopsies to get a formal diagnosis Establish with Primary Care for ongoing care management Repeat CBC, BMP, Mg at follow up   Discharge Diagnoses: Principal Problem:   Cavitating mass of lung/ lung abscess Active Problems:   Septic embolism ([name])   Collapse of left lung   Esophageal abnormality   Pericarditis   Anemia   Hyponatremia   Underweight   Auditory hallucinations   Tobacco use disorder   Paranoid schizophrenia (Kenneth [name])   Bipolar 1 disorder (Kenneth [name])   Hypertension   Pneumonia of left lung due to infectious organism   Chronic obstructive lung disease ([name])   Abnormal CT scan, esophagus   Protein-calorie malnutrition, severe   Thrombocytosis   Hypercalcemia   Necrotizing pneumonia (Kenneth [name])   Lives in group home  Resolved Problems:   Sepsis (Kenneth [name])   Constipation  Hospital Course: Kenneth [name]. is a 62 y.o. male with medical history significant for Paranoid schizophrenia, hypertension, diagnosed with iron deficiency anemia in November 2023 for which he received Venofer x 1 by oncology with recommendation for further treatment and colonoscopy but subsequently lost to follow-up.  He presented to the emergency department (from the group home) with auditory hallucinations.  He is on Invega injections for schizophrenia.   In the emergency department reviewed sepsis, pneumonia, cavitary lesion suspicious for abscess versus necrotic mass.  He was also found to have esophageal wall thickening and pericarditis on CT chest.  He was started on IV fluids and  empiric IV antibiotics.  Further details of hospital course and management as below.   2/12 -- pt doing well clinically, asymptomatic.  He confirms he does not wish to know whether or not he has cancer, declines further procedures to get a diagnosis.  His cousin, Kenneth [name], is taking him home with her while they find a new group home.  Medically stable for discharge today.   Assessment and Plan: * Cavitating mass of lung/ lung abscess Collapse of left lung No significant respiratory complaints aside from mild cough. Etiology TBD - infectious vs malignancy given all the imaging findings. Treated initially with broad spectrum IV antibiotics >> Unasyn --Now on Augmentin - continue --ID is following --Pulmonology consulted, signed off. They plan for outpatient evaluation, PET scan recommended. Pt declines further evaluation to determine if malignancy --Aspiration precautions --Speech therapy evaluation --Supplemental oxygen if needed --Antitussives, albuterol as needed and supportive care   Sepsis ([name])-resolved as of 07/27/2022 Septic embolism Patient with multiple foci of infection versus metastasis including lungs, kidneys, pericardium Sepsis criteria include tachycardia, hypotension, leukocytosis of 27,000 Primary source appears is pulmonary (pneumonia) Received Sepsis protocol fluids and IV broad spectrum antibiotics. --Follow blood cultures --ID following  Septic embolism ([name]) Versus metastatic disease.   Mgmt as outlined  Collapse of left lung See "Cavitating mass..."  Acute pyelonephritis CT showed findings concerning for pyelonephritis. Early renal abscesses are not excluded. Continue aforementioned antibiotics Follow-up urinalysis ID consult  Pericarditis CT shows thickening and hyperenhancement of the visceral and parietal pericardium with central low attenuation fluid compatible with pericarditis.Marland KitchenMarland KitchenDifferential consideration includes septic emboli or  metastases. Echocardiogram 07/16/22 showed LVEF 16-10%, grade 1 diastolic dysfunction. Pt seems asymptomatic, no chest pain complaints or dyspnea. --Consider cardiology consult  --Will pursue biopsy by EGD this admission  Esophageal abnormality As outlined   Anemia Suspect related to underlying malignancy He was given IV Venofer 200 mg in November for incidental finding of anemia on routine blood work. Hemoglobin 10.1 on admission, down from 12.3 in Nov 2023. --Continue to trend hemoglobin  --Will defer iron infusions given possible infection --Hematology consult vs follow up  Hyponatremia Sodium trending down 136 >> 134 >> 132. Suspect hypovolemia, unclear how much he is eating and drinking.  Also potential malignancy is a concern. Given gentle IV fluids.   Na stable 132-134  --encourage PO intake --repeat BMP in AM  Underweight Patient had a drop in BMI from 22.65 a couple months prior to 18.99 based on chart review High suspicion for malignancy  Auditory hallucinations Hx of Paranoid Schizophrenia and Bipolar 1 Disorder Patient appears stable at this time Seen by psyhciatry.  Per their NP: "He can continue the oral Invega and follow-up with his outpatient provider. Per the group home, his initial dose of Kenneth [name] is due Feb. 08".    Schizophrenia, undifferentiated ([place]) .  Lives in group home Pt's cousin reports group home is closing, being shut down by the state.  She feels he has not been well cared for there and has already moved out his belongings since his admission. --TOC consulted  Necrotizing pneumonia (Kenneth [name]) .  Hypercalcemia Initial Ca on admission was 8.9 >> 10.4 (on 2/8).  Suspect dehydration given sodium also trending down.   Malignancy is a concern, however this is acute elevation of Ca.   --Monitor off IV fluids  --Monitor BMP --check PTH, PTHrP - pending  Thrombocytosis Likely reactive. Monitor CBC.  Protein-calorie malnutrition,  severe Related to chronic illness (schizophrenia) as evidenced by moderate fat depletion, severe fat depletion, moderate muscle depletion, severe muscle depletion, percent weight loss.  --Appreciate dietitian recommendations --Started on liberalized diet, Ensure drinks, Magic cups TID, multivitamins  Abnormal CT scan, esophagus Per CT report: "Question circumferential lower esophageal wall thickening. Underlying neoplasm is difficult to exclude." -- Updated GI re negative AFB smears x 3 -- Patient declines EGD / biopsies  Chronic obstructive lung disease ([place]) Stable.  Continue home inhaler DuoNebs as needed  Pneumonia of left lung due to infectious organism Continue Augmentin Follow up pending AFB smear x 3. Quaniferon negative. ID following - see their recs Supportive care per orders  Hypertension Hypotension Patient was previously on HCTZ - held. BP's are controlled, soft at times. --Continue holding HCTZ   Bipolar 1 disorder ([place]) Continue current regimen per psych.  Paranoid schizophrenia ([place]) See auditory hallucinations  Tobacco use disorder Nicotine patch if withdrawal suspected.   Constipation-resolved as of 07/27/2022 Bowel regimen per orders. Resolved. Had BM on 2/4         Consultants: Infectious disease, pulmonology, GI, psychiatry Procedures performed: None  Disposition: Home Diet recommendation:  Regular diet DISCHARGE MEDICATION: Allergies as of 07/27/2022   No Known Allergies      Medication List     STOP taking these medications    hydrochlorothiazide 12.5 MG tablet Commonly known as: HYDRODIURIL   lisinopril 5 MG tablet Commonly known as: ZESTRIL       TAKE these medications    acetaminophen 325 MG tablet Commonly known as: TYLENOL Take 2 tablets (650 mg total) by mouth every 6 (six) hours as needed  for mild pain, fever or headache (or Fever >/= 101).   albuterol 108 (90 Base) MCG/ACT inhaler Commonly known as:  VENTOLIN HFA Inhale 2 puffs into the lungs every 6 (six) hours as needed for wheezing or shortness of breath.   amoxicillin-clavulanate 875-125 MG tablet Commonly known as: AUGMENTIN Take 1 tablet by mouth every 12 (twelve) hours.   atorvastatin 20 MG tablet Commonly known as: LIPITOR Take 20 mg by mouth daily.   Breztri Aerosphere 160-9-4.8 MCG/ACT Aero Generic drug: Budeson-Glycopyrrol-Formoterol Inhale 1 puff into the lungs daily.   feeding supplement Liqd Take 237 mLs by mouth 3 (three) times daily between meals.   HYDROcodone-acetaminophen 5-325 MG tablet Commonly known as: NORCO/VICODIN Take 1-2 tablets by mouth every 4 (four) hours as needed for moderate pain.   hydrOXYzine 25 MG tablet Commonly known as: ATARAX Take 25 mg by mouth 3 (three) times daily.   Invega Trinza 819 MG/2.63ML injection Generic drug: paliperidone Palmitate ER Inject into the muscle. What changed: Another medication with the same name was removed. Continue taking this medication, and follow the directions you see here.   montelukast 10 MG tablet Commonly known as: SINGULAIR Take 10 mg by mouth daily.   multivitamin with minerals Tabs tablet Take 1 tablet by mouth daily. Start taking on: July 28, 2022   ondansetron 4 MG tablet Commonly known as: ZOFRAN Take 1 tablet (4 mg total) by mouth every 6 (six) hours as needed for nausea.   oxybutynin 10 MG 24 hr tablet Commonly known as: DITROPAN-XL Take 10 mg by mouth daily.   paliperidone 3 MG 24 hr tablet Commonly known as: INVEGA Take 1 tablet (3 mg total) by mouth daily. Start taking on: July 28, 2022   PARoxetine 20 MG tablet Commonly known as: PAXIL Take 20 mg by mouth daily.   polyethylene glycol 17 g packet Commonly known as: MIRALAX / GLYCOLAX Take 17 g by mouth daily as needed for mild constipation.   senna-docusate 8.6-50 MG tablet Commonly known as: Senokot-S Take 1 tablet by mouth at bedtime as needed for mild  constipation.        Follow-up [facility]. Go on 07/30/2022.   Why: @11 :30am Contact information: [place] [address] [phone]         Ottie Glazier, MD. Call.   Specialty: Pulmonary Disease Why: If breathing symptoms worsen, or if you decide to pursue a formal diagnosis and want procedure to do so. Contact information: [place] [address] [phone]         Lin Landsman, MD. Call.   Specialty: Gastroenterology Why: As needed if having GI issues or difficulty swallowing.   If you decide to pursue a formal diagnosis and want procedure to do so, Gastroentrology can do upper endoscopy. Contact information: [place] [address] [phone]                Discharge Exam: Danley Danker Weights   07/15/22 1920  Weight: 63.5 kg   General exam: awake, alert, no acute distress, underweight HEENT: atraumatic, clear conjunctiva, anicteric sclera, moist mucus membranes, hearing grossly normal  Respiratory system: diminished breath sounds L>R, no wheezes, normal respiratory effort. On room air Cardiovascular system: normal S1/S2, RRR, no JVD, murmurs, rubs, gallops, no pedal edema.   Gastrointestinal system: soft, NT, ND, no HSM felt, +bowel sounds. Central nervous system: A&O x3. no gross focal neurologic deficits, normal speech Extremities: moves  all, no edema, normal tone Skin: dry, intact, normal temperature, normal color, No rashes, lesions or ulcers Psychiatry: normal mood, congruent affect, judgement and insight appear normal   Condition at discharge: stable  The results of significant diagnostics from this hospitalization (including imaging, microbiology, ancillary and laboratory) are listed below for reference.   Imaging Studies: US RENAL  Result Date: 07/18/2022 CLINICAL DATA:  Renal abscess. EXAM: RENAL / URINARY TRACT ULTRASOUND COMPLETE COMPARISON:   Abdominal CT July 15, 2022 FINDINGS: Right Kidney: Renal measurements: 13.0 x 5.3 cm echogenicity within normal limits. No mass or hydronephrosis visualized. Left Kidney: Renal measurements: 13.7 x 6.1 x 6.4 cm = volume: 277 mL. Heterogeneous appearance and thickening of the cortex of the left kidney. Small subcapsular fluid collection versus cyst in the midpole region of the left kidney measures 1.0 x 1.1 x 0.9 cm. Bladder: Appears normal for degree of bladder distention. Bilateral ureteral jets are seen. Other: None. IMPRESSION: Normal sonographic appearance of the right kidney. Heterogeneous appearance and cortical thickening of the left kidney likely representing pyelonephritis. 1.1 cm subcapsular fluid collection versus small cyst in the midpole region of the left kidney. Electronically Signed   By: Fidela Salisbury M.D.   On: 07/18/2022 18:08   ECHOCARDIOGRAM COMPLETE  Result Date: 07/16/2022    ECHOCARDIOGRAM REPORT   Patient Name:   Kenneth [name]. Date of Exam: 07/16/2022 Medical Rec #:  [id number]           Height:       72.0 in Accession #:    [id number]          Weight:       140.0 lb Date of Birth:  Jan 09, 1961            BSA:          1.831 m Patient Age:    59 years            BP:           111/67 mmHg Patient Gender: M                   HR:           100 bpm. Exam Location:  [facility] Procedure: 2D Echo, Cardiac Doppler and Color Doppler Indications:     Pericardial Effusion I31.3  History:         Patient has prior history of Echocardiogram examinations, most                  recent 03/27/2020. Risk Factors:Hypertension.  Sonographer:     Sherrie Sport Referring Phys:  [id number] Athena Masse Diagnosing Phys: Ida Rogue MD  Sonographer Comments: Image quality was good. IMPRESSIONS  1. Left ventricular ejection fraction, by estimation, is 55 to 60%. The left ventricle has normal function. The left ventricle has no regional wall motion abnormalities. Left ventricular diastolic parameters are  consistent with Grade I diastolic dysfunction (impaired relaxation).  2. Right ventricular systolic function is normal. The right ventricular size is normal. There is normal pulmonary artery systolic pressure. The estimated right ventricular systolic pressure is 44.9 mmHg.  3. The mitral valve is normal in structure. No evidence of mitral valve regurgitation. No evidence of mitral stenosis.  4. The aortic valve is tricuspid. Aortic valve regurgitation is not visualized. No aortic stenosis is present.  5. The inferior vena cava is normal in size with greater than 50% respiratory variability, suggesting right atrial pressure of 3 mmHg. FINDINGS  Left Ventricle: Left ventricular ejection fraction, by estimation, is 55 to 60%. The left ventricle has normal function. The left ventricle has no regional wall motion abnormalities. The left ventricular internal cavity size was normal in size. There is  no left ventricular hypertrophy. Left ventricular diastolic parameters are consistent with Grade I diastolic dysfunction (impaired relaxation). Right Ventricle: The right ventricular size is normal. No increase in right ventricular wall thickness. Right ventricular systolic function is normal. There is normal pulmonary artery systolic pressure. The tricuspid regurgitant velocity is 1.44 m/s, and  with an assumed right atrial pressure of 5 mmHg, the estimated right ventricular systolic pressure is 06.3 mmHg. Left Atrium: Left atrial size was normal in size. Right Atrium: Right atrial size was normal in size. Pericardium: There is no evidence of pericardial effusion. Mitral Valve: The mitral valve is normal in structure. No evidence of mitral valve regurgitation. No evidence of mitral valve stenosis. Tricuspid Valve: The tricuspid valve is normal in structure. Tricuspid valve regurgitation is mild . No evidence of tricuspid stenosis. Aortic Valve: The aortic valve is tricuspid. Aortic valve regurgitation is not visualized. No  aortic stenosis is present. Aortic valve mean gradient measures 6.0 mmHg. Aortic valve peak gradient measures 10.2 mmHg. Aortic valve area, by VTI measures 2.83  cm. Pulmonic Valve: The pulmonic valve was normal in structure. Pulmonic valve regurgitation is mild. No evidence of pulmonic stenosis. Aorta: The aortic root is normal in size and structure. Venous: The inferior vena cava is normal in size with greater than 50% respiratory variability, suggesting right atrial pressure of 3 mmHg. IAS/Shunts: No atrial level shunt detected by color flow Doppler.  LEFT VENTRICLE PLAX 2D LVIDd:         4.20 cm   Diastology LVIDs:         2.60 cm   LV e' medial:    11.70 cm/s LV PW:         1.00 cm   LV E/e' medial:  8.3 LV IVS:        0.90 cm   LV e' lateral:   10.20 cm/s LVOT diam:     2.20 cm   LV E/e' lateral: 9.6 LV SV:         66 LV SV Index:   36 LVOT Area:     3.80 cm  RIGHT VENTRICLE RV Basal diam:  2.60 cm RV Mid diam:    2.00 cm RV S prime:     18.70 cm/s TAPSE (M-mode): 1.7 cm LEFT ATRIUM           Index        RIGHT ATRIUM           Index LA diam:      2.40 cm 1.31 cm/m   RA Area:     14.60 cm LA Vol (A2C): 37.4 ml 20.43 ml/m  RA Volume:   38.00 ml  20.75 ml/m LA Vol (A4C): 16.7 ml 9.12 ml/m  AORTIC VALVE AV Area (Vmax):    3.16 cm AV Area (Vmean):   3.03 cm AV Area (VTI):     2.83 cm AV Vmax:           160.00 cm/s AV Vmean:          111.000 cm/s AV VTI:            0.234 m AV Peak Grad:      10.2 mmHg AV Mean Grad:      6.0 mmHg LVOT Vmax:  133.00 cm/s LVOT Vmean:        88.600 cm/s LVOT VTI:          0.174 m LVOT/AV VTI ratio: 0.74  AORTA Ao Root diam: 3.23 cm MITRAL VALVE                TRICUSPID VALVE MV Area (PHT): 4.80 cm     TR Peak grad:   8.3 mmHg MV Decel Time: 158 msec     TR Vmax:        144.00 cm/s MV E velocity: 97.50 cm/s MV A velocity: 108.00 cm/s  SHUNTS MV E/A ratio:  0.90         Systemic VTI:  0.17 m                             Systemic Diam: 2.20 cm Ida Rogue MD  Electronically signed by Ida Rogue MD Signature Date/Time: 07/16/2022/2:05:44 PM    Final    CT Chest W Contrast  Result Date: 07/15/2022 CLINICAL DATA:  Chest wall pain infection or inflammation suspected EXAM: CT CHEST WITH CONTRAST TECHNIQUE: Multidetector CT imaging of the chest was performed during intravenous contrast administration. RADIATION DOSE REDUCTION: This exam was performed according to the departmental dose-optimization program which includes automated exposure control, adjustment of the mA and/or kV according to patient size and/or use of iterative reconstruction technique. CONTRAST:  41mL OMNIPAQUE IOHEXOL 300 MG/ML  SOLN COMPARISON:  Radiographs 07/15/2022 and CT chest angiogram 03/26/2020 FINDINGS: Cardiovascular: Normal heart size. Small pericardial effusion. Mild pericardial thickening and hyperenhancement of the visceral and parietal pericardium anteriorly with central low attenuation fluid. Mediastinum/Nodes: Lobular heterogenous mass centered near the left hilum measures 6.2 x 6.8 cm. This causes severe narrowing of the left lower lobe pulmonary artery in the left mainstem pulmonary artery. Unchanged right hilar lymph node measuring 1.5 x 1.9 cm. New left esophageal node measuring 1.3 cm (2/130). Question lower esophageal wall thickening. Lungs/Pleura: Low-attenuation fluid/debris within the left mainstem bronchus and throughout the left lung which is entirely opacified/collapsed. There is a dominant low-attenuation collection in the left lower lobe with air-fluid level measuring 3.8 cm. This could represent a abscess or necrotic cavitary mass with communication with the bronchi. Diffuse low-attenuation of the left upper lobe likely due to pneumonia. No focal consolidation, pleural effusion, or pneumothorax in the right lung. There are multiple solid nodules in the right lung. For example 9 mm nodule in the medial right upper lobe (3/80); 1.0 cm nodule in the right lower lobe along  the major fissure (3/112); 9 mm cavitary nodule in the right lower lobe (3/89; and right lower lobe subpleural nodule measuring 5 mm (3/116). Intermediate density fluid collection or nodule along the posterior left lower lobe is indeterminate and could represent loculated pleural fluid or subpleural mass (2/132). Low-density fluid along the right upper lobe may be a loculated pleural effusion or developing pneumonia (circa series 2/image 63). Upper Abdomen: Patchy ill-defined rounded heterogenous areas of low attenuation within the mid left kidney as seen on CT abdomen pelvis earlier today. Right adrenal 1.4 cm nodule is stable since CT chest 03/26/2020. Musculoskeletal: Remote left rib fractures. No acute fracture or destructive osseous lesion. IMPRESSION: Heterogenous low-attenuation lobulated mass about the left hilum is indeterminate and may represent a pulmonary abscess versus necrotic mass. Within the left mid lung there is a 3.8 cm cavitary lesion with air-fluid level either due to abscess or necrotic mass with  communication with the tracheobronchial tree. Low-attenuation fluid/debris diffusely fills the left lung bronchi including the left mainstem bronchus. Complete opacification of the left lung with low-attenuation fluid/debris in the left upper lobe likely due to pneumonia. Intermediate density fluid collection or nodule along the posterior left lower lobe is indeterminate and could represent loculated pleural fluid or subpleural mass. Thickening and hyperenhancement of the visceral and parietal pericardium with central low attenuation fluid compatible with pericarditis. Multiple nodules measuring up to 10 mm in the right lung. Differential consideration includes septic emboli or metastases. Unchanged right hilar lymphadenopathy and new lower paraesophageal lymphadenopathy are indeterminate. Continued attention on follow-up. Question circumferential lower esophageal wall thickening. Underlying neoplasm  is difficult to exclude. Left presumed pyelonephritis. See report from CT abdomen and pelvis earlier today for details. Electronically Signed   By: Placido Sou M.D.   On: 07/15/2022 19:21   CT ABDOMEN PELVIS W CONTRAST  Result Date: 07/15/2022 CLINICAL DATA:  Acute abdominal pain EXAM: CT ABDOMEN AND PELVIS WITH CONTRAST TECHNIQUE: Multidetector CT imaging of the abdomen and pelvis was performed using the standard protocol following bolus administration of intravenous contrast. RADIATION DOSE REDUCTION: This exam was performed according to the departmental dose-optimization program which includes automated exposure control, adjustment of the mA and/or kV according to patient size and/or use of iterative reconstruction technique. CONTRAST:  167mL OMNIPAQUE IOHEXOL 300 MG/ML  SOLN COMPARISON:  Chest x-ray same day.  CT chest 04/04/2020. FINDINGS: Lower chest: Consolidated lung is seen throughout the lower left hemithorax with volume loss. Hepatobiliary: No focal liver abnormality is seen. No gallstones, gallbladder wall thickening, or biliary dilatation. Pancreas: Unremarkable. No pancreatic ductal dilatation or surrounding inflammatory changes. Spleen: Normal in size without focal abnormality. Adrenals/Urinary Tract: Patchy ill-defined rounded heterogeneous areas of hypodensity are seen in the left mid kidney. The largest area measures proximally 4 cm. There is mild surrounding inflammatory stranding. There is no hydronephrosis or perinephric fluid collection. Ill-defined patchy area of hypodensity in the right kidney is small in size. Adrenal glands and bladder are within normal limits. Stomach/Bowel: Stomach is within normal limits. Appendix is not seen. No evidence of bowel wall thickening, distention, or inflammatory changes. There is a large amount of stool throughout the colon. Vascular/Lymphatic: Aortic atherosclerosis. No enlarged abdominal or pelvic lymph nodes. Reproductive: Prostate is  unremarkable. Other: No abdominal wall hernia or abnormality. No abdominopelvic ascites. Musculoskeletal: No acute or significant osseous findings. IMPRESSION: 1. Patchy ill-defined areas of hypodensity in the left kidney with mild surrounding inflammatory stranding. Findings are concerning for pyelonephritis. Early renal abscesses are not excluded. 2. Patchy area of hypodensity in the right kidney is small in size and may represent focal pyelonephritis. 3. Consolidated lung in the lower left hemithorax with volume loss. Findings may represent atelectasis or infection. Underlying malignancy not excluded 4. Large amount of stool throughout the colon. Aortic Atherosclerosis (ICD10-I70.0). Electronically Signed   By: Ronney Asters M.D.   On: 07/15/2022 18:35   DG Chest 2 View  Result Date: 07/15/2022 CLINICAL DATA:  Weakness and cough EXAM: CHEST - 2 VIEW COMPARISON:  Chest CT dated March 26, 2020 FINDINGS: Cardiac and mediastinal contours are not visualized. New complete opacification of the left hemithorax. Leftward deviation of the trachea suggests findings are in part due to atelectasis and less likely due to large pleural effusion. Right lung is clear. Old left-sided rib fractures. IMPRESSION: New complete opacification of the left hemithorax. Leftward deviation of the trachea suggests findings are in part due to atelectasis  and less likely due to large pleural effusion. Recommend contrast-enhanced chest CT to assess for underlying malignancy. Electronically Signed   By: Yetta Glassman M.D.   On: 07/15/2022 18:09    Microbiology: Results for orders placed or performed during the hospital encounter of 07/15/22  Resp panel by RT-PCR (RSV, Flu A&B, Covid) Anterior Nasal Swab     Status: None   Collection Time: 07/15/22  6:05 PM   Specimen: Anterior Nasal Swab  Result Value Ref Range Status   SARS Coronavirus 2 by RT PCR NEGATIVE NEGATIVE Final    Comment: (NOTE) SARS-CoV-2 target nucleic acids are  NOT DETECTED.  The SARS-CoV-2 RNA is generally detectable in upper respiratory specimens during the acute phase of infection. The lowest concentration of SARS-CoV-2 viral copies this assay can detect is 138 copies/mL. A negative result does not preclude SARS-Cov-2 infection and should not be used as the sole basis for treatment or other patient management decisions. A negative result may occur with  improper specimen collection/handling, submission of specimen other than nasopharyngeal swab, presence of viral mutation(s) within the areas targeted by this assay, and inadequate number of viral copies(<138 copies/mL). A negative result must be combined with clinical observations, patient history, and epidemiological information. The expected result is Negative.  Fact Sheet for Patients:  EntrepreneurPulse.com.au  Fact Sheet for Healthcare Providers:  IncredibleEmployment.be  This test is no t yet approved or cleared by the [place] FDA and  has been authorized for detection and/or diagnosis of SARS-CoV-2 by FDA under an Emergency Use Authorization (EUA). This EUA will remain  in effect (meaning this test can be used) for the duration of the COVID-19 declaration under Section 564(b)(1) of the Act, 21 U.S.C.section 360bbb-3(b)(1), unless the authorization is terminated  or revoked sooner.       Influenza A by PCR NEGATIVE NEGATIVE Final   Influenza B by PCR NEGATIVE NEGATIVE Final    Comment: (NOTE) The Xpert Xpress SARS-CoV-2/FLU/RSV plus assay is intended as an aid in the diagnosis of influenza from Nasopharyngeal swab specimens and should not be used as a sole basis for treatment. Nasal washings and aspirates are unacceptable for Xpert Xpress SARS-CoV-2/FLU/RSV testing.  Fact Sheet for Patients: EntrepreneurPulse.com.au  Fact Sheet for Healthcare Providers: IncredibleEmployment.be  This test is not  yet approved or cleared by the [place] FDA and has been authorized for detection and/or diagnosis of SARS-CoV-2 by FDA under an Emergency Use Authorization (EUA). This EUA will remain in effect (meaning this test can be used) for the duration of the COVID-19 declaration under Section 564(b)(1) of the Act, 21 U.S.C. section 360bbb-3(b)(1), unless the authorization is terminated or revoked.     Resp Syncytial Virus by PCR NEGATIVE NEGATIVE Final    Comment: (NOTE) Fact Sheet for Patients: EntrepreneurPulse.com.au  Fact Sheet for Healthcare Providers: IncredibleEmployment.be  This test is not yet approved or cleared by the [place] FDA and has been authorized for detection and/or diagnosis of SARS-CoV-2 by FDA under an Emergency Use Authorization (EUA). This EUA will remain in effect (meaning this test can be used) for the duration of the COVID-19 declaration under Section 564(b)(1) of the Act, 21 U.S.C. section 360bbb-3(b)(1), unless the authorization is terminated or revoked.  Performed at [facility], [address] [place], [place] [address]   Blood Culture (routine x 2)     Status: None   Collection Time: 07/15/22  7:15 PM   Specimen: BLOOD  Result Value Ref Range Status   Specimen  Description BLOOD LEFT AC  Final   Special Requests   Final    BOTTLES DRAWN AEROBIC AND ANAEROBIC Blood Culture adequate volume   Culture   Final    NO GROWTH 5 DAYS Performed at [facility], [address] [place], [place] [address]    Report Status 07/20/2022 FINAL  Final  Blood Culture (routine x 2)     Status: None   Collection Time: 07/15/22  7:15 PM   Specimen: BLOOD  Result Value Ref Range Status   Specimen Description BLOOD RIGHT [facility]  Final   Special Requests   Final    BOTTLES DRAWN AEROBIC AND ANAEROBIC Blood Culture adequate volume   Culture   Final    NO GROWTH 5 DAYS Performed at [facility], [address] [place], [place] [address]    Report Status 07/20/2022 FINAL  Final  MRSA Next Gen by PCR, Nasal     Status: None   Collection Time: 07/16/22  9:50 AM   Specimen: Nasal Mucosa; Nasal Swab  Result Value Ref Range Status   MRSA by PCR Next Gen NOT DETECTED NOT DETECTED Final    Comment: (NOTE) The GeneXpert MRSA Assay (FDA approved for NASAL specimens only), is one component of a comprehensive MRSA colonization surveillance program. It is not intended to diagnose MRSA infection nor to guide or monitor treatment for MRSA infections. Test performance is not FDA approved in patients less than 10 years old. Performed at [facility], [address] [place], [place] [address]   Acid Fast Smear (AFB)     Status: None   Collection Time: 07/16/22 10:09 AM   Specimen: Sputum  Result Value Ref Range Status   AFB Specimen Processing Concentration  Final   Acid Fast Smear Negative  Final    Comment: (NOTE) Performed At: [facility] [place], [place] [id number] Rush Farmer MD [id number]    Source (AFB) SPUTUM  Final    Comment: Performed at [facility], [address] [place], [place] [address]  Acid Fast Smear (AFB)     Status: None   Collection Time: 07/16/22 11:11 AM   Specimen: Sputum  Result Value Ref Range Status   AFB Specimen Processing Concentration  Final   Acid Fast Smear Negative  Final    Comment: (NOTE) Performed At: [facility] [place], [place] [id number] Rush Farmer MD [id number]    Source (AFB) SPUTUM  Final    Comment: Performed at [facility], [address] [place], [place] [address]  Acid Fast Smear (AFB)     Status: None   Collection Time: 07/17/22 12:15 PM   Specimen: Sputum  Result Value Ref Range Status   AFB Specimen Processing Concentration  Final   Acid Fast Smear Negative  Final    Comment: (NOTE) Performed At: [facility], [place] [id number] Rush Farmer MD [id number]    Source (AFB) SPUTUM  Final    Comment: Performed at [facility], [address] [place],  [address]    Labs: CBC: Recent Labs  Lab 07/21/22 [id number] 07/23/22 0416 07/24/22 0346 07/25/22 0631 07/26/22 0358  WBC 14.3* 15.7* 13.7* 14.6* 13.8*  NEUTROABS 11.1*  --   --  11.3*  --   HGB 10.1* 9.9* 9.0* 9.2* 9.5*  HCT 32.4* 31.4* 28.7* 29.5* 30.0*  MCV 82.2 82.2 82.5 82.2 83.1  PLT 648* 587* 536* 541* 574*  Basic Metabolic Panel: Recent Labs  Lab 07/23/22 0416 07/24/22 0346 07/25/22 0631 07/26/22 0358  NA 132* 132* 133* 134*  K 3.9 3.8 3.9 4.1  CL 96* 97* 99 99  CO2 30 28 28 29   GLUCOSE 96 142* 116* 115*  BUN 21* 21* 19 20  CREATININE 0.79 0.87 0.80 0.89  CALCIUM 10.4* 9.9  10.4* 10.0 10.2  MG 2.0  --   --   --   PHOS 3.3  --   --   --    Liver Function Tests: No results for input(s): "AST", "ALT", "ALKPHOS", "BILITOT", "PROT", "ALBUMIN" in the last 168 hours. CBG: No results for input(s): "GLUCAP" in the last 168 hours.  Discharge time spent: greater than 30 minutes.  Signed: Ezekiel Slocumb, DO [facility] 07/27/2022

## 2022-07-27 NOTE — TOC Transition Note (Signed)
Transition of Care [facility]) - CM/SW Discharge Note   Patient Details  Name: Kenneth [name]. MRN: [id number] Date of Birth: 1961-03-07  [facility] CM/SW Contact:  Tiburcio Bash, LCSW Phone Number: 07/27/2022, 10:26 AM   Clinical Narrative:     Patient to discharge home today, CSW spoke with patient's cousin Verdis Frederickson regarding dc plan. She reports she will transport patient home today, states they had set up group home admission for 2/2 however patient was admitted to hospital. States she has plans to complete intake upon discharge and patient will be set up with group home following discharge. In the interim can stay at her one level home.   She reports no discharge needs at this time.    Final next level of care: Home/Self Care Barriers to Discharge: No Barriers Identified   Patient Goals and CMS Choice CMS Medicare.gov Compare Post Acute Care list provided to:: Patient Choice offered to / list presented to : Patient  Discharge Placement                         Discharge Plan and Services Additional resources added to the After Visit Summary for                                       Social Determinants of Health (SDOH) Interventions SDOH Screenings   Food Insecurity: No Food Insecurity (07/16/2022)  Housing: Low Risk  (07/16/2022)  Transportation Needs: No Transportation Needs (07/16/2022)  Utilities: Not At Risk (07/16/2022)  Alcohol Screen: Low Risk  (03/16/2018)  Tobacco Use: High Risk (07/17/2022)     Readmission Risk Interventions     No data to display

## 2022-07-27 NOTE — Care Management Important Message (Signed)
Important Message  Patient Details  Name: Kenneth [name]. MRN: [id number] Date of Birth: 1960-08-11   Medicare Important Message Given:  Yes     Dannette Barbara 07/27/2022, 11:54 AM

## 2022-07-27 NOTE — Progress Notes (Signed)
Patient being discharged home. PIV removed. Went over discharge instructions and medications with patient and patients cousin Verdis Frederickson. They stated they understood and all questions were answered. Patient will be going home POV with his cousin Verdis Frederickson.

## 2022-07-30 LAB — ACID FAST SMEAR (AFB, MYCOBACTERIA): Acid Fast Smear: NEGATIVE

## 2022-08-03 LAB — PTH-RELATED PEPTIDE: PTH-related peptide: <2 pmol/L

## 2022-08-05 ENCOUNTER — Telehealth: Payer: Self-pay | Admitting: Gastroenterology

## 2022-08-05 NOTE — Telephone Encounter (Signed)
Patient's cousin called and is wondering if the patient needs to be seen earlier then 10/22/2022. She wanted me to ask doctor Vanga when she wants to see the patient, just in case it is more urgent.

## 2022-08-05 NOTE — Telephone Encounter (Signed)
Not urgent, can schedule with me or Dr. Vicente Males   RV

## 2022-08-06 ENCOUNTER — Telehealth: Payer: Self-pay | Admitting: Oncology

## 2022-08-06 NOTE — Telephone Encounter (Signed)
pt POA called in to notify office that Primary MD sent over referral with req for certain labs to be done. POA wanted to ensure that labs are connected to next visit or if pt can be seen sooner. please advise

## 2022-08-14 ENCOUNTER — Other Ambulatory Visit: Payer: Self-pay | Admitting: Pulmonary Disease

## 2022-08-14 DIAGNOSIS — R918 Other nonspecific abnormal finding of lung field: Secondary | ICD-10-CM

## 2022-08-18 ENCOUNTER — Ambulatory Visit
Admission: RE | Admit: 2022-08-18 | Discharge: 2022-08-18 | Disposition: A | Discharge status: Home / Self Care | Payer: Medicare HMO | Source: Ambulatory Visit | Attending: Pulmonary Disease | Admitting: Pulmonary Disease

## 2022-08-18 ENCOUNTER — Inpatient Hospital Stay: Payer: Medicare HMO | Attending: Oncology

## 2022-08-18 ENCOUNTER — Other Ambulatory Visit: Payer: Self-pay | Admitting: Pulmonary Disease

## 2022-08-18 DIAGNOSIS — D509 Iron deficiency anemia, unspecified: Secondary | ICD-10-CM | POA: Diagnosis not present

## 2022-08-18 DIAGNOSIS — R918 Other nonspecific abnormal finding of lung field: Secondary | ICD-10-CM | POA: Insufficient documentation

## 2022-08-18 DIAGNOSIS — R7989 Other specified abnormal findings of blood chemistry: Secondary | ICD-10-CM | POA: Diagnosis not present

## 2022-08-18 DIAGNOSIS — I700 Atherosclerosis of aorta: Secondary | ICD-10-CM | POA: Diagnosis not present

## 2022-08-18 DIAGNOSIS — D75838 Other thrombocytosis: Secondary | ICD-10-CM | POA: Insufficient documentation

## 2022-08-18 LAB — CBC WITH DIFFERENTIAL/PLATELET
Abs Immature Granulocytes: 0.07 10*3/uL (ref 0.00–0.07)
Basophils Absolute: 0.1 10*3/uL (ref 0.0–0.1)
Basophils Relative: 0 %
Eosinophils Absolute: 0.3 10*3/uL (ref 0.0–0.5)
Eosinophils Relative: 2 %
HCT: 28.4 % — ABNORMAL LOW (ref 39.0–52.0)
Hemoglobin: 8.8 g/dL — ABNORMAL LOW (ref 13.0–17.0)
Immature Granulocytes: 1 %
Lymphocytes Relative: 10 %
Lymphs Abs: 1.5 10*3/uL (ref 0.7–4.0)
MCH: 25.7 pg — ABNORMAL LOW (ref 26.0–34.0)
MCHC: 31 g/dL (ref 30.0–36.0)
MCV: 83 fL (ref 80.0–100.0)
Monocytes Absolute: 1.4 10*3/uL — ABNORMAL HIGH (ref 0.1–1.0)
Monocytes Relative: 9 %
Neutro Abs: 11.5 10*3/uL — ABNORMAL HIGH (ref 1.7–7.7)
Neutrophils Relative %: 78 %
Platelets: 558 10*3/uL — ABNORMAL HIGH (ref 150–400)
RBC: 3.42 MIL/uL — ABNORMAL LOW (ref 4.22–5.81)
RDW: 14.6 % (ref 11.5–15.5)
WBC: 14.8 10*3/uL — ABNORMAL HIGH (ref 4.0–10.5)
nRBC: 0 % (ref 0.0–0.2)

## 2022-08-18 LAB — IRON AND TIBC
Iron: 27 ug/dL — ABNORMAL LOW (ref 45–182)
Saturation Ratios: 17 % — ABNORMAL LOW (ref 17.9–39.5)
TIBC: 158 ug/dL — ABNORMAL LOW (ref 250–450)
UIBC: 131 ug/dL

## 2022-08-18 LAB — FERRITIN: Ferritin: 796 ng/mL — ABNORMAL HIGH (ref 24–336)

## 2022-08-18 LAB — GLUCOSE, CAPILLARY: Glucose-Capillary: 107 mg/dL — ABNORMAL HIGH (ref 70–99)

## 2022-08-18 MED ORDER — FLUDEOXYGLUCOSE F - 18 (FDG) INJECTION
7.8600 | Freq: Once | INTRAVENOUS | Status: AC | PRN
  Administered 2022-08-18: 7.86 via INTRAVENOUS

## 2022-08-20 ENCOUNTER — Encounter: Payer: Self-pay | Admitting: Oncology

## 2022-08-20 ENCOUNTER — Inpatient Hospital Stay: Payer: Medicare HMO

## 2022-08-20 ENCOUNTER — Telehealth: Payer: Self-pay

## 2022-08-20 ENCOUNTER — Inpatient Hospital Stay (HOSPITAL_BASED_OUTPATIENT_CLINIC_OR_DEPARTMENT_OTHER): Payer: Medicare HMO | Admitting: Oncology

## 2022-08-20 VITALS — BP 132/70 | HR 117 | Temp 97.2°F | Resp 22 | Ht 72.0 in | Wt 145.0 lb

## 2022-08-20 VITALS — BP 117/69 | HR 105

## 2022-08-20 DIAGNOSIS — D509 Iron deficiency anemia, unspecified: Secondary | ICD-10-CM

## 2022-08-20 DIAGNOSIS — C3490 Malignant neoplasm of unspecified part of unspecified bronchus or lung: Secondary | ICD-10-CM | POA: Diagnosis not present

## 2022-08-20 DIAGNOSIS — K769 Liver disease, unspecified: Secondary | ICD-10-CM

## 2022-08-20 MED ORDER — SODIUM CHLORIDE 0.9 % IV SOLN
INTRAVENOUS | Status: DC
  Filled 2022-08-20: qty 250

## 2022-08-20 MED ORDER — SODIUM CHLORIDE 0.9 % IV SOLN
200.0000 mg | Freq: Once | INTRAVENOUS | Status: AC
  Administered 2022-08-20: 200 mg via INTRAVENOUS
  Filled 2022-08-20: qty 200

## 2022-08-20 NOTE — Telephone Encounter (Signed)
Form faxed to Marcie Bal to schedule liver biopsy for liver lesion per Dr. Grayland Ormond.

## 2022-08-20 NOTE — Progress Notes (Signed)
Pt has been educated and understands. Pt refused to stay 30 mins after iron infusion.  VSS.

## 2022-08-20 NOTE — Progress Notes (Signed)
[place]  [phone] [phone]  ID: [name] OB: 1960/12/16  MR#: [id number]  [id number]  Patient Care Team: Pcp, No as PCP - General  CHIEF COMPLAINT: Iron deficiency anemia, lung mass.  INTERVAL HISTORY: Patient returns to clinic today for further evaluation, discussion of his imaging results, and continuation of IV iron.  He is accompanied by his cousin today who has moved him out of his group home.  He has had worsening weakness and fatigue, poor appetite and weight loss, and a recent fall.  He has no neurologic complaints.  He denies any chest pain, shortness of breath, cough, or hemoptysis.  He denies any nausea, vomiting, constipation, or diarrhea.  He has no melena or hematochezia.  He has no urinary complaints.  Patient feels generally terrible, but offers no further specific complaints today.  REVIEW OF SYSTEMS:   Review of Systems  Constitutional:  Positive for malaise/fatigue and weight loss. Negative for fever.  Respiratory: Negative.  Negative for cough, hemoptysis and shortness of breath.   Cardiovascular: Negative.  Negative for chest pain and leg swelling.  Gastrointestinal: Negative.  Negative for abdominal pain, blood in stool and melena.  Genitourinary: Negative.  Negative for hematuria.  Musculoskeletal:  Positive for falls. Negative for back pain.  Skin: Negative.  Negative for rash.  Neurological:  Positive for weakness. Negative for dizziness, focal weakness and headaches.  Psychiatric/Behavioral: Negative.  The patient is not nervous/anxious.     As per HPI. Otherwise, a complete review of systems is negative.  PAST MEDICAL HISTORY: Past Medical History:  Diagnosis Date   Bipolar 1 disorder ([place])    Constipation 07/22/2022   Hypertension    Memory loss    Paranoid schizophrenia ([place])    Sepsis ([place])     PAST SURGICAL HISTORY: History reviewed. No pertinent surgical history.  FAMILY  HISTORY: Family History  Problem Relation Age of Onset   Diabetes Mother    Cancer Mother    Cancer Maternal Grandfather     ADVANCED DIRECTIVES (Y/N):  N  HEALTH MAINTENANCE: Social History   Tobacco Use   Smoking status: Every Day    Types: Cigarettes   Smokeless tobacco: Never  Substance Use Topics   Alcohol use: Not Currently   Drug use: Never     Colonoscopy:  PAP:  Bone density:  Lipid panel:  No Known Allergies  Current Outpatient Medications  Medication Sig Dispense Refill   acetaminophen (TYLENOL) 325 MG tablet Take 2 tablets (650 mg total) by mouth every 6 (six) hours as needed for mild pain, fever or headache (or Fever >/= 101).     albuterol (VENTOLIN HFA) 108 (90 Base) MCG/ACT inhaler Inhale 2 puffs into the lungs every 6 (six) hours as needed for wheezing or shortness of breath. 8 g 2   amoxicillin-clavulanate (AUGMENTIN) 875-125 MG tablet Take 1 tablet by mouth every 12 (twelve) hours. 60 tablet 0   atorvastatin (LIPITOR) 20 MG tablet Take 20 mg by mouth daily.     BREZTRI AEROSPHERE 160-9-4.8 MCG/ACT AERO Inhale 1 puff into the lungs daily.     feeding supplement (ENSURE ENLIVE / ENSURE PLUS) LIQD Take 237 mLs by mouth 3 (three) times daily between meals. 237 mL 12   HYDROcodone-acetaminophen (NORCO/VICODIN) 5-325 MG tablet Take 1-2 tablets by mouth every 4 (four) hours as needed for moderate pain. 30 tablet 0   hydrOXYzine (ATARAX) 25 MG tablet Take 25 mg by mouth 3 (three) times daily.  INVEGA TRINZA 819 MG/2.63ML injection Inject into the muscle.     montelukast (SINGULAIR) 10 MG tablet Take 10 mg by mouth daily.     Multiple Vitamin (MULTIVITAMIN WITH MINERALS) TABS tablet Take 1 tablet by mouth daily.     ondansetron (ZOFRAN) 4 MG tablet Take 1 tablet (4 mg total) by mouth every 6 (six) hours as needed for nausea. 20 tablet 0   oxybutynin (DITROPAN-XL) 10 MG 24 hr tablet Take 10 mg by mouth daily.     paliperidone (INVEGA) 3 MG 24 hr tablet Take 1  tablet (3 mg total) by mouth daily. 30 tablet 0   PARoxetine (PAXIL) 20 MG tablet Take 20 mg by mouth daily.     polyethylene glycol (MIRALAX / GLYCOLAX) 17 g packet Take 17 g by mouth daily as needed for mild constipation. 30 each 0   senna-docusate (SENOKOT-S) 8.6-50 MG tablet Take 1 tablet by mouth at bedtime as needed for mild constipation. 30 tablet 1   No current facility-administered medications for this visit.   Facility-Administered Medications Ordered in Other Visits  Medication Dose Route Frequency Provider Last Rate Last Admin   0.9 %  sodium chloride infusion   Intravenous Continuous Lloyd Huger, MD 20 mL/hr at 08/20/22 1537 New Bag at 08/20/22 1537   iron sucrose (VENOFER) 200 mg in sodium chloride 0.9 % 100 mL IVPB  200 mg Intravenous Once Lloyd Huger, MD 440 mL/hr at 08/20/22 1538 200 mg at 08/20/22 1538    OBJECTIVE: Vitals:   08/20/22 1452  BP: 132/70  Pulse: (!) 117  Resp: (!) 22  Temp: (!) 97.2 F (36.2 C)  SpO2: 100%      Body mass index is 19.67 kg/m.    ECOG FS:1 - Symptomatic but completely ambulatory  General: Well-developed, well-nourished, no acute distress. Eyes: Pink conjunctiva, anicteric sclera. HEENT: Normocephalic, moist mucous membranes. Lungs: No audible wheezing or coughing. Heart: Regular rate and rhythm. Abdomen: Soft, nontender, no obvious distention. Musculoskeletal: No edema, cyanosis, or clubbing. Neuro: Alert, answering all questions appropriately. Cranial nerves grossly intact. Skin: No rashes or petechiae noted. Psych: Normal affect.  LAB RESULTS:  Lab Results  Component Value Date   NA 134 (L) 07/26/2022   K 4.1 07/26/2022   CL 99 07/26/2022   CO2 29 07/26/2022   GLUCOSE 115 (H) 07/26/2022   BUN 20 07/26/2022   CREATININE 0.89 07/26/2022   CALCIUM 10.2 07/26/2022   PROT 6.1 (L) 07/16/2022   ALBUMIN 1.8 (L) 07/16/2022   AST 23 07/16/2022   ALT 36 07/16/2022   ALKPHOS 94 07/16/2022   BILITOT 0.5  07/16/2022   GFRNONAA >60 07/26/2022   GFRAA >60 11/21/2018    Lab Results  Component Value Date   WBC 14.8 (H) 08/18/2022   NEUTROABS 11.5 (H) 08/18/2022   HGB 8.8 (L) 08/18/2022   HCT 28.4 (L) 08/18/2022   MCV 83.0 08/18/2022   PLT 558 (H) 08/18/2022   Lab Results  Component Value Date   IRON 27 (L) 08/18/2022   TIBC 158 (L) 08/18/2022   IRONPCTSAT 17 (L) 08/18/2022   Lab Results  Component Value Date   FERRITIN 796 (H) 08/18/2022     STUDIES: NM PET Image Initial (PI) Skull Base To Thigh (F-18 FDG)  Result Date: 08/19/2022 CLINICAL DATA:  Initial treatment strategy for cavitary left lung mass/pneumonia. Suspected pyelonephritis. Evaluate for malignancy. EXAM: NUCLEAR MEDICINE PET SKULL BASE TO THIGH TECHNIQUE: 7.86 mCi F-18 FDG was injected intravenously. Full-ring PET imaging  was performed from the skull base to thigh after the radiotracer. CT data was obtained and used for attenuation correction and anatomic localization. Fasting blood glucose: 107 mg/dl COMPARISON:  Chest CTA 03/26/2020. CTs of the chest, abdomen and pelvis 07/15/2022. Renal ultrasound 07/18/2022. FINDINGS: Mediastinal blood pool activity: SUV max 1.5 NECK: No hypermetabolic cervical lymph nodes are identified.Activity within the muscles of phonation, likely physiologic.No suspicious activity identified within the pharyngeal mucosal space. Incidental CT findings: none CHEST: Persistent complete consolidation of the left lung with volume loss and mediastinal shift to the left. There is a large (8.3 cm) area of intense hypermetabolic activity in the left perihilar region (SUV max 18.0). Additional multifocal hypermetabolic activity is present throughout the collapsed left lung. In the right lung, there are multiple enlarging well-circumscribed hypermetabolic pulmonary nodules which most likely represent metastatic disease. Representative lesions include a 1.4 cm right lower lobe nodule on image 61/4 (SUV max 10.4) and  an anterior right lower lobe nodule measuring 1.5 cm on image 72/4 (SUV max 9.5). In addition, there are hypermetabolic mediastinal lymph nodes and/or pericardial deposits. Anterior to the chronic, this has an SUV max of 14.2. There is a focal component anterior to the right ventricle which has an SUV max of 14.4, probably reflecting a pericardial metastasis. Incidental CT findings: Relatively mild underlying atherosclerosis of the aorta, great vessels and coronary arteries. Persistent partial central cavitation centrally in the left lung. ABDOMEN/PELVIS: Several hypermetabolic liver lesions consistent with metastatic disease. A lesion anteriorly in the right lobe has an SUV max of 9.7. There is lesion in the caudate lobe within SUV max of 9.5. There is focal hypermetabolic activity laterally in the wall of the stomach (SUV max 12.4). Diffuse abnormality of the left kidney persists with associated heterogeneous hypermetabolic activity (SUV max 29.9). The left kidney is faceless and enlarged, measuring up to 8.3 x 5.8 cm on image 98/4. No abnormality of the right kidney, adrenal glands, pancreas or spleen is seen. There is hypermetabolic lymph node medial to the left kidney, but no other hypermetabolic abdominal or pelvic lymph nodes. Incidental CT findings: Large amount of stool throughout the colon. Aortic and branch vessel atherosclerosis. SKELETON: Large hypermetabolic lesion within the right sacrum (SUV max 14.9), highly suspicious for metastatic disease. A clear corresponding abnormality on the CT images is not seen. No other definite osseous metastases. Incidental CT findings: Bilateral L5 pars defects with a grade 1 anterolisthesis and mild foraminal narrowing bilaterally at L5-S1. IMPRESSION: 1. Findings are most consistent with widespread hypermetabolic metastatic disease with bilateral pulmonary, hepatic, pericardial, nodal and osseous lesions as described. The most likely site of primary malignancy is in  the left perihilar region where there is a persistent large hypermetabolic cavitary lung mass and complete consolidation of the left lung. 2. Diffuse progressive abnormality of the left kidney with associated heterogeneous hypermetabolic activity, most consistent with renal malignancy. This has some features suggesting lymphoma, although this may reflect a metastasis or primary renal malignancy. 3. Focal hypermetabolic activity in the lateral wall of the stomach, potentially a metastasis. The relatively small size of this lesion makes primary gastric malignancy less likely. 4. Some of the findings in the left hemithorax may be secondary to superimposed posterior obstructive pneumonitis and infection. Correlate clinically. 5.  Aortic Atherosclerosis (ICD10-I70.0). Electronically Signed   By: Richardean Sale M.D.   On: 08/19/2022 16:19    ASSESSMENT: Iron deficiency anemia, lung mass.  PLAN:    Left hilar lung mass: Highly suspicious for underlying  malignancy.  PET scan results from August 18, 2022 consistent with widespread metastatic disease with bilateral pulmonary, hepatic, pericardial, and osseous lesions noted.  Case discussed with pulmonology and will attempt IR guided biopsy of liver lesion for diagnosis.  Bronchoscopy can be done if liver biopsy is not possible or results are inconclusive.  Return to clinic in 2 to 3 weeks to discuss the results and treatment planning. Falls: Will get MRI of the brain to further evaluate. Iron deficiency anemia: Despite receiving 5 doses of IV Venofer in December 2023, patient's hemoglobin remains 8.8 with reduced iron stores.  Proceed with 200 mg IV Venofer today.  Patient will then receive 2 additional doses next week and we will plan for a fourth dose when he returns to discuss his biopsy results.   Elevated ferritin: Likely secondary to acute phase reactant. Leukocytosis: Likely reactive, monitor. Thrombocytosis: Reactive and possibly secondary to iron  deficiency.   Patient expressed understanding and was in agreement with this plan. He also understands that He can call clinic at any time with any questions, concerns, or complaints.    Lloyd Huger, MD   08/20/2022 3:47 PM

## 2022-08-22 ENCOUNTER — Encounter: Payer: Self-pay | Admitting: Urgent Care

## 2022-08-25 ENCOUNTER — Encounter: Payer: Self-pay | Admitting: Oncology

## 2022-08-25 ENCOUNTER — Telehealth: Payer: Self-pay | Admitting: Oncology

## 2022-08-25 ENCOUNTER — Inpatient Hospital Stay: Payer: Medicare HMO

## 2022-08-25 ENCOUNTER — Inpatient Hospital Stay: Admission: RE | Admit: 2022-08-25 | Payer: Medicare HMO | Source: Ambulatory Visit

## 2022-08-25 NOTE — Telephone Encounter (Signed)
Patients cousin would like to know if he still needs to come for Iron today- she request a call back

## 2022-08-26 ENCOUNTER — Inpatient Hospital Stay: Payer: Medicare HMO

## 2022-08-27 ENCOUNTER — Inpatient Hospital Stay: Payer: Medicare HMO

## 2022-08-28 ENCOUNTER — Inpatient Hospital Stay: Payer: Medicare HMO

## 2022-08-28 ENCOUNTER — Ambulatory Visit: Payer: Medicare HMO

## 2022-08-31 ENCOUNTER — Other Ambulatory Visit: Payer: Medicare HMO

## 2022-09-01 ENCOUNTER — Ambulatory Visit: Payer: Medicare HMO

## 2022-09-02 ENCOUNTER — Ambulatory Visit: Admit: 2022-09-02 | Payer: Medicare HMO

## 2022-09-02 ENCOUNTER — Other Ambulatory Visit: Payer: Medicare HMO

## 2022-09-02 SURGERY — BRONCHOSCOPY, WITH BIOPSY USING ELECTROMAGNETIC NAVIGATION
Anesthesia: General

## 2022-09-03 LAB — ACID FAST CULTURE WITH REFLEXED SENSITIVITIES (MYCOBACTERIA): Acid Fast Culture: NEGATIVE

## 2022-09-09 ENCOUNTER — Ambulatory Visit: Payer: Medicare HMO

## 2022-09-09 ENCOUNTER — Other Ambulatory Visit: Payer: Medicare HMO

## 2022-09-09 ENCOUNTER — Ambulatory Visit: Payer: Medicare HMO | Admitting: Oncology

## 2022-09-10 LAB — ACID FAST CULTURE WITH REFLEXED SENSITIVITIES (MYCOBACTERIA): Acid Fast Culture: NEGATIVE

## 2022-09-14 DEATH — deceased

## 2022-10-13 ENCOUNTER — Encounter: Payer: Self-pay | Admitting: Oncology

## 2022-10-14 ENCOUNTER — Encounter: Payer: Self-pay | Admitting: Oncology

## 2022-10-16 ENCOUNTER — Other Ambulatory Visit: Payer: Self-pay

## 2022-10-22 ENCOUNTER — Ambulatory Visit: Payer: Medicare HMO | Admitting: Gastroenterology

## 2022-10-22 ENCOUNTER — Encounter: Payer: Self-pay | Admitting: Gastroenterology
